# Patient Record
Sex: Male | Born: 1965 | Race: White | Hispanic: No | Marital: Single | Smoking: Former smoker
Health system: Southern US, Community
[De-identification: ages and names within clinical notes are randomized; demographics above are authoritative.]

## PROBLEM LIST (undated history)

## (undated) DIAGNOSIS — K560 Paralytic ileus: Secondary | ICD-10-CM

## (undated) DIAGNOSIS — F419 Anxiety disorder, unspecified: Secondary | ICD-10-CM

## (undated) DIAGNOSIS — D649 Anemia, unspecified: Secondary | ICD-10-CM

## (undated) DIAGNOSIS — D689 Coagulation defect, unspecified: Secondary | ICD-10-CM

## (undated) DIAGNOSIS — R000 Tachycardia, unspecified: Secondary | ICD-10-CM

## (undated) DIAGNOSIS — I10 Essential (primary) hypertension: Secondary | ICD-10-CM

## (undated) DIAGNOSIS — G9340 Encephalopathy, unspecified: Secondary | ICD-10-CM

## (undated) DIAGNOSIS — K7460 Unspecified cirrhosis of liver: Secondary | ICD-10-CM

## (undated) HISTORY — PX: ESOPHAGOGASTRODUODENOSCOPY: SHX1529

---

## 2010-05-06 HISTORY — PX: NASAL SEPTUM SURGERY: SHX37

## 2014-06-06 DIAGNOSIS — F419 Anxiety disorder, unspecified: Secondary | ICD-10-CM

## 2014-06-06 HISTORY — DX: Anxiety disorder, unspecified: F41.9

## 2015-06-07 DIAGNOSIS — K7460 Unspecified cirrhosis of liver: Secondary | ICD-10-CM

## 2015-06-07 HISTORY — DX: Unspecified cirrhosis of liver: K74.60

## 2016-04-06 DIAGNOSIS — D649 Anemia, unspecified: Secondary | ICD-10-CM

## 2016-04-06 DIAGNOSIS — D689 Coagulation defect, unspecified: Secondary | ICD-10-CM

## 2016-04-06 DIAGNOSIS — G9340 Encephalopathy, unspecified: Secondary | ICD-10-CM

## 2016-04-06 HISTORY — DX: Anemia, unspecified: D64.9

## 2016-04-06 HISTORY — DX: Encephalopathy, unspecified: G93.40

## 2016-04-06 HISTORY — DX: Coagulation defect, unspecified: D68.9

## 2016-07-20 HISTORY — PX: COLONOSCOPY W/ POLYPECTOMY: SHX1380

## 2016-09-04 DIAGNOSIS — R000 Tachycardia, unspecified: Secondary | ICD-10-CM

## 2016-09-04 HISTORY — DX: Tachycardia, unspecified: R00.0

## 2016-09-16 ENCOUNTER — Emergency Department (HOSPITAL_COMMUNITY)
Admission: EM | Admit: 2016-09-16 | Discharge: 2016-09-16 | Disposition: A | Discharge status: Home / Self Care | Payer: PRIVATE HEALTH INSURANCE | Attending: Emergency Medicine | Admitting: Emergency Medicine

## 2016-09-16 ENCOUNTER — Encounter (HOSPITAL_COMMUNITY): Payer: Self-pay | Admitting: Emergency Medicine

## 2016-09-16 DIAGNOSIS — I10 Essential (primary) hypertension: Secondary | ICD-10-CM | POA: Insufficient documentation

## 2016-09-16 DIAGNOSIS — F1010 Alcohol abuse, uncomplicated: Secondary | ICD-10-CM

## 2016-09-16 DIAGNOSIS — F10230 Alcohol dependence with withdrawal, uncomplicated: Secondary | ICD-10-CM | POA: Insufficient documentation

## 2016-09-16 HISTORY — DX: Essential (primary) hypertension: I10

## 2016-09-16 HISTORY — DX: Unspecified cirrhosis of liver: K74.60

## 2016-09-16 LAB — COMPREHENSIVE METABOLIC PANEL
ALT: 61 U/L (ref 17–63)
AST: 155 U/L — ABNORMAL HIGH (ref 15–41)
Albumin: 3.2 g/dL — ABNORMAL LOW (ref 3.5–5.0)
Alkaline Phosphatase: 96 U/L (ref 38–126)
Anion gap: 10 (ref 5–15)
BILIRUBIN TOTAL: 6.4 mg/dL — AB (ref 0.3–1.2)
BUN: 10 mg/dL (ref 6–20)
CALCIUM: 8.6 mg/dL — AB (ref 8.9–10.3)
CHLORIDE: 100 mmol/L — AB (ref 101–111)
CO2: 25 mmol/L (ref 22–32)
Creatinine, Ser: 0.55 mg/dL — ABNORMAL LOW (ref 0.61–1.24)
GFR calc Af Amer: >60 mL/min (ref >60)
Glucose, Bld: 144 mg/dL — ABNORMAL HIGH (ref 65–99)
Potassium: 3.5 mmol/L (ref 3.5–5.1)
Sodium: 135 mmol/L (ref 135–145)
Total Protein: 8.2 g/dL — ABNORMAL HIGH (ref 6.5–8.1)

## 2016-09-16 LAB — CBC
HEMATOCRIT: 32.2 % — AB (ref 39.0–52.0)
Hemoglobin: 11.5 g/dL — ABNORMAL LOW (ref 13.0–17.0)
MCH: 34.5 pg — ABNORMAL HIGH (ref 26.0–34.0)
MCHC: 35.7 g/dL (ref 30.0–36.0)
MCV: 96.7 fL (ref 78.0–100.0)
Platelets: 74 10*3/uL — ABNORMAL LOW (ref 150–400)
RBC: 3.33 MIL/uL — ABNORMAL LOW (ref 4.22–5.81)
RDW: 15.2 % (ref 11.5–15.5)
WBC: 7.2 10*3/uL (ref 4.0–10.5)

## 2016-09-16 LAB — ETHANOL: Alcohol, Ethyl (B): <5 mg/dL (ref <5)

## 2016-09-16 LAB — LIPASE, BLOOD: LIPASE: 48 U/L (ref 11–51)

## 2016-09-16 MED ORDER — SODIUM CHLORIDE 0.9 % IV SOLN
1000.0000 mL | INTRAVENOUS | Status: DC
  Administered 2016-09-16: 1000 mL via INTRAVENOUS

## 2016-09-16 MED ORDER — CHLORDIAZEPOXIDE HCL 25 MG PO CAPS: ORAL_CAPSULE | ORAL | 0 refills | Status: DC

## 2016-09-16 MED ORDER — SODIUM CHLORIDE 0.9 % IV SOLN
1000.0000 mL | Freq: Once | INTRAVENOUS | Status: AC
  Administered 2016-09-16: 1000 mL via INTRAVENOUS

## 2016-09-16 MED ORDER — LORAZEPAM 1 MG PO TABS
2.0000 mg | ORAL_TABLET | Freq: Once | ORAL | Status: AC
  Administered 2016-09-16: 2 mg via ORAL
  Filled 2016-09-16: qty 2

## 2016-09-16 MED ORDER — LORAZEPAM 2 MG/ML IJ SOLN
1.0000 mg | Freq: Once | INTRAMUSCULAR | Status: AC
  Administered 2016-09-16: 1 mg via INTRAVENOUS
  Filled 2016-09-16: qty 1

## 2016-09-16 MED ORDER — LORAZEPAM 1 MG PO TABS
1.0000 mg | ORAL_TABLET | Freq: Once | ORAL | Status: AC
  Administered 2016-09-16: 1 mg via ORAL
  Filled 2016-09-16: qty 1

## 2016-09-16 NOTE — Discharge Instructions (Signed)
Substance Abuse Treatment Programs ° °Intensive Outpatient Programs °[facility]     °[address]      °[place], [place]                   °[phone]      ° °[facility] °[address] °[place], [place] °336-379-7146 ° °[facility]     °(Inpatient and outpatient)     °[address]           °[phone]   ° °[facility] °336-288-1484 (Suboxone and Methadone) ° °[address]      °[place], [place] [address]      °336-882-2125      ° °[address] °[place], [place] °852-3033 ° °[facility] (Outpatient/Inpatient, Chemical)    °(insurance only) [phone]      °       °[facility] (Groups & Residential) °[place], [place] °336-389-1413 ° °   °[facility]     °[address]     °[place], [place]      °336-389-1413      ° °[facility] (for caregivers and family) °[address] °[place], [place] °336-299-4655 ° ° ° ° ° °Residential Treatment Programs °[facility]      °[address], [place], [place] [address]  °[phone]      ° °[facility] °[address], [place] [address] °[phone] ° °[facility]        °336-248-8914      ° °[facility] °1-800-659-3381 ° °[facility]             °[address]                                         °[place], [place]                                                °877-615-2722 or 336-784-9470                              ° °[facility] [place] °[address] °[place], [address] °1.877.941.8954 ° °[facility]    °[address]      °[place], [place]     °[phone]      ° °[facility] °[address] °[place], [place] °336-285-9073 ° °[facility]   °[address]     °[place], [place] [address]     °336-899-1550      °Admissions: 8am-3pm M-F ° °[facility] °[address] °[place],  [place] °336-227-7417 ° °[facility]: Residential Program (90 Days)   °[place], [place]      °336-725-8389 or 800-758-6077    ° °[facility] °[place], [place] °(Walk in Hours over the weekend or by referral) ° °[facility] °[address], [place],  [address] °([phone] ° °Crisis Mobile: [facility]:  1-877-626-1772 (for crisis response 24 hours a day) °[facility] Hotline:      1-800-256-2452 °Outpatient Psychiatry and Counseling ° °[facility]: Mobile Crisis   Management 24 hours:  1-877-626-1772 ° °[facility] sliding scale fee and walk in schedule: M-F 8am-12pm/1pm-3pm °[address]  °[place], [place] [address] °[phone] ° °[facility] °[address] °[place], [place] [address] °336-703-9650 ° °[facility] ([facility] known [facility])- new patient walk-in appointments available Monday - Friday 8am -3pm.          °[address] °[place], [place] [address] °336-676-6840 or crisis line- 336-676-6905 ° °[facility] Intensive Outpatient Therapy Program °[address] °[place], [place] [address] °336-832-9804 ° °[facility]                  °Crisis Services      °[phone]      °[address]     °[place], [place] [address]                ° °[facility]   °[facility] °[phone] °[address] °[place], [place] [address] ° ° °[facility]          °[address],  °[place], [place] [address]       °([phone]) [phone] ° °[facility] °[address] °[place], [place] [address] °336-292-1510 ° °[facility]    °[address]    °[place], [place] [address]     °336-632-3505      ° °Parish McKinney, MD     °[address]     °[place] [address]     °336-282-1251     °  °[facility] °[address] °[place] [address] ° °[facility]     °[address]     °[place], [place]      °336-542-2076      ° °[facility] °Shamsher Ahluwalia, MD °[address] °[place], [place] [address] °336-420-9558 ° °[facility]     °[address]     °[place], [place] [address]     °336-274-1237      ° °[facility] °[address] °[place], [place] [address] °336-854-4450 °Resources for Temporary Residential Assistance/Crisis Centers ° °DAY CENTERS °[facility] °M-F 8am-3pm   °[address] [place], [place] [address]   336-332-0824 °Services include: laundry, barbering, support groups, case management, phone  & computer access, showers, AA/NA mtgs, mental health/substance abuse nurse, job skills class, disability information, VA assistance, spiritual classes, etc.  ° °HOMELESS SHELTERS ° °[facility]     °[facility]   °[address], [place]     °[phone]       °       °[facility] (women and children)       °[address] °[place], [place] [address] °336-275-0820 °[email or web address] for application and process °Application Required ° °[facility]   °[address]    °[place] [address]     °[phone]       °             °[facility] °[address] °, [place] [address] °336.273.5572 °336-235-0363(schedule application appt.) °Application Required ° °[facility] (women only)    °[address]     °[place],  [address]     °336-884-1039      °  Intake starts 6pm daily °Need valid ID, SSC, & Police report °[facility] [place] °[address] °[place], [place] °336-881-5420 °Application Required ° °[facility] (men only)     °[address]      °[place], [place]     °[phone]      ° °[facility] °(Pregnant women only) °[address] °[place], [place] °336-275-0206 ° °[facility]      °[address]      °[place], [place] [address]     °336-722-9951      °       °[facility] °[address] °[place], [place] °336-723-1848 °90 day commitment/SA/Application process ° °[facility](men only)     °[address]     °[place], [place]     °[phone]       °Check-in at 7pm     °       °[facility] °[address] °[place], [place] [address] °336-248-6684 °Men/Women/Women and Children must be there by 7 pm ° °[facility] °[place], [place] °336-722-8721                ° °

## 2016-09-16 NOTE — ED Notes (Addendum)
[phone] [employer] Contact: Betsy  [facility] and [facility].  Betsy willing to give ride if needed tomorrow.  Please have patient call prior to d/c.

## 2016-09-16 NOTE — ED Provider Notes (Signed)
Emergency Department Provider Note   I have reviewed the triage vital signs and the nursing notes.   HISTORY  Chief Complaint Alcohol Problem   HPI Ray Hayden is a 51 y.o. male with PMH of cirrhosis and HTN resents to the emergency department for evaluation of alcohol detox. The patient is a daily drinker and was arrested for DUI yesterday. He got out of jail this morning and was having severe tremors. Denies any history of complicated withdrawal. He has an intake at treatment center tomorrow. No modifying factors. Symptoms are severe in quality.    Past Medical History:  Diagnosis Date  . Cirrhosis (HCC)   . Hypertension     There are no active problems to display for this patient.   History reviewed. No pertinent surgical history.    Allergies Sulfa antibiotics  No family history on file.  Social History Social History  Substance Use Topics  . Smoking status: Not on file  . Smokeless tobacco: Not on file  . Alcohol use Yes    Review of Systems  Constitutional: No fever/chills Eyes: No visual changes. ENT: No sore throat. Cardiovascular: Denies chest pain. Respiratory: Denies shortness of breath. Gastrointestinal: No abdominal pain.  No nausea, no vomiting.  No diarrhea.  No constipation. Genitourinary: Negative for dysuria. Musculoskeletal: Negative for back pain. Skin: Negative for rash. Neurological: Negative for headaches, focal weakness or numbness. Positive tremor.   10-point ROS otherwise negative.  ____________________________________________   PHYSICAL EXAM:  VITAL SIGNS: ED Triage Vitals [09/16/16 1415]  Enc Vitals Group     BP 124/70     Pulse Rate (!) 120     Resp 18     Temp 98.5 F (36.9 C)     Temp Source Oral     SpO2 100 %   Constitutional: Alert and oriented. Well appearing and in no acute distress. Tremor on exam.  Eyes: Conjunctivae are normal. PERRL.  Head: Atraumatic. Nose: No congestion/rhinnorhea. Mouth/Throat:  Mucous membranes are moist.  Neck: No stridor.   Cardiovascular: Normal rate, regular rhythm. Good peripheral circulation. Grossly normal heart sounds.   Respiratory: Normal respiratory effort.  No retractions. Lungs CTAB. Gastrointestinal: Soft and nontender. No distention.  Musculoskeletal: No lower extremity tenderness nor edema. No gross deformities of extremities. Neurologic:  Normal speech and language. No gross focal neurologic deficits are appreciated.  Skin:  Skin is warm, dry and intact. No rash noted. Psychiatric: Mood and affect are normal. Speech and behavior are normal.  ____________________________________________   LABS (all labs ordered are listed, but only abnormal results are displayed)  Labs Reviewed  CBC - Abnormal; Notable for the following:       Result Value   RBC 3.33 (*)    Hemoglobin 11.5 (*)    HCT 32.2 (*)    MCH 34.5 (*)    Platelets 74 (*)    All other components within normal limits  COMPREHENSIVE METABOLIC PANEL - Abnormal; Notable for the following:    Chloride 100 (*)    Glucose, Bld 144 (*)    Creatinine, Ser 0.55 (*)    Calcium 8.6 (*)    Total Protein 8.2 (*)    Albumin 3.2 (*)    AST 155 (*)    Total Bilirubin 6.4 (*)    All other components within normal limits  LIPASE, BLOOD  ETHANOL   ____________________________________________   PROCEDURES  Procedure(s) performed:   Procedures  None ____________________________________________   INITIAL IMPRESSION / ASSESSMENT AND PLAN /  ED COURSE  Pertinent labs & imaging results that were available during my care of the patient were reviewed by me and considered in my medical decision making (see chart for details).  Patient presents to the ED with for evaluation of EtOH detox. No history of seizure or DTs. Will observe for serial CIWA and reassessment.   07:48 PM Patient is resting comfortably. No distress. Does not meet criteria for inpatient detox. Discharging home with Librium  taper and [facility] with [facility]. [facility] member will come to drive the patient home and help him get to treatment center tomorrow.   At this time, I do not feel there is any life-threatening condition present. I have reviewed and discussed all results (EKG, imaging, lab, urine as appropriate), exam findings with patient. I have reviewed nursing notes and appropriate previous records.  I feel the patient is safe to be discharged home without further emergent workup. Discussed usual and customary return precautions. Patient and family (if present) verbalize understanding and are comfortable with this plan.  Patient will follow-up with their primary care provider. If they do not have a primary care provider, information for follow-up has been provided to them. All questions have been answered.  ____________________________________________  FINAL CLINICAL IMPRESSION(S) / ED DIAGNOSES  Final diagnoses:  Alcohol abuse     MEDICATIONS GIVEN DURING THIS VISIT:  Medications  0.9 %  sodium chloride infusion (not administered)    Followed by  0.9 %  sodium chloride infusion (not administered)  LORazepam (ATIVAN) injection 1 mg (not administered)  LORazepam (ATIVAN) tablet 1 mg (not administered)     NEW OUTPATIENT MEDICATIONS STARTED DURING THIS VISIT:  Discharge Medication List as of 09/16/2016  7:47 PM    START taking these medications   Details  chlordiazePOXIDE (LIBRIUM) 25 MG capsule  PO TID x 1D, then 25-50mg  PO BID X 1D, then 25-50mg  PO QD X 1D, Print          Note:  This document was prepared using Dragon voice recognition software and may include unintentional dictation errors.  Alona Bene, MD Emergency Medicine   Maia Plan, MD 09/19/16 [phone]

## 2016-09-16 NOTE — ED Triage Notes (Signed)
Per EMS pt complaint of tremor related to ETOH use; last use 0400 yesterday.

## 2016-09-21 ENCOUNTER — Inpatient Hospital Stay (HOSPITAL_COMMUNITY): Payer: PRIVATE HEALTH INSURANCE

## 2016-09-21 ENCOUNTER — Inpatient Hospital Stay (HOSPITAL_COMMUNITY): Payer: PRIVATE HEALTH INSURANCE | Admitting: Anesthesiology

## 2016-09-21 ENCOUNTER — Emergency Department (HOSPITAL_COMMUNITY): Payer: PRIVATE HEALTH INSURANCE

## 2016-09-21 ENCOUNTER — Encounter (HOSPITAL_COMMUNITY)
Admission: EM | Disposition: A | Discharge status: Skilled Nursing Facility | Payer: Self-pay | Source: Home / Self Care | Attending: Pulmonary Disease

## 2016-09-21 ENCOUNTER — Encounter (HOSPITAL_COMMUNITY): Payer: Self-pay | Admitting: Emergency Medicine

## 2016-09-21 ENCOUNTER — Inpatient Hospital Stay (HOSPITAL_COMMUNITY)
Admission: EM | Admit: 2016-09-21 | Discharge: 2016-10-13 | DRG: 432 | Disposition: A | Discharge status: Skilled Nursing Facility | Payer: PRIVATE HEALTH INSURANCE | Attending: Nephrology | Admitting: Nephrology

## 2016-09-21 DIAGNOSIS — Z79899 Other long term (current) drug therapy: Secondary | ICD-10-CM

## 2016-09-21 DIAGNOSIS — R140 Abdominal distension (gaseous): Secondary | ICD-10-CM

## 2016-09-21 DIAGNOSIS — R627 Adult failure to thrive: Secondary | ICD-10-CM | POA: Diagnosis not present

## 2016-09-21 DIAGNOSIS — Z7189 Other specified counseling: Secondary | ICD-10-CM

## 2016-09-21 DIAGNOSIS — K920 Hematemesis: Secondary | ICD-10-CM | POA: Diagnosis present

## 2016-09-21 DIAGNOSIS — K56600 Partial intestinal obstruction, unspecified as to cause: Secondary | ICD-10-CM | POA: Diagnosis not present

## 2016-09-21 DIAGNOSIS — K3189 Other diseases of stomach and duodenum: Secondary | ICD-10-CM | POA: Diagnosis not present

## 2016-09-21 DIAGNOSIS — R578 Other shock: Secondary | ICD-10-CM

## 2016-09-21 DIAGNOSIS — K922 Gastrointestinal hemorrhage, unspecified: Secondary | ICD-10-CM

## 2016-09-21 DIAGNOSIS — Z9114 Patient's other noncompliance with medication regimen: Secondary | ICD-10-CM

## 2016-09-21 DIAGNOSIS — I8511 Secondary esophageal varices with bleeding: Secondary | ICD-10-CM

## 2016-09-21 DIAGNOSIS — R945 Abnormal results of liver function studies: Secondary | ICD-10-CM

## 2016-09-21 DIAGNOSIS — K766 Portal hypertension: Secondary | ICD-10-CM | POA: Diagnosis present

## 2016-09-21 DIAGNOSIS — K56609 Unspecified intestinal obstruction, unspecified as to partial versus complete obstruction: Secondary | ICD-10-CM

## 2016-09-21 DIAGNOSIS — D696 Thrombocytopenia, unspecified: Secondary | ICD-10-CM | POA: Diagnosis present

## 2016-09-21 DIAGNOSIS — E871 Hypo-osmolality and hyponatremia: Secondary | ICD-10-CM | POA: Diagnosis present

## 2016-09-21 DIAGNOSIS — D72829 Elevated white blood cell count, unspecified: Secondary | ICD-10-CM | POA: Diagnosis not present

## 2016-09-21 DIAGNOSIS — R7989 Other specified abnormal findings of blood chemistry: Secondary | ICD-10-CM

## 2016-09-21 DIAGNOSIS — F10239 Alcohol dependence with withdrawal, unspecified: Secondary | ICD-10-CM | POA: Diagnosis not present

## 2016-09-21 DIAGNOSIS — I8501 Esophageal varices with bleeding: Secondary | ICD-10-CM | POA: Diagnosis not present

## 2016-09-21 DIAGNOSIS — M79605 Pain in left leg: Secondary | ICD-10-CM | POA: Diagnosis not present

## 2016-09-21 DIAGNOSIS — J690 Pneumonitis due to inhalation of food and vomit: Secondary | ICD-10-CM | POA: Diagnosis not present

## 2016-09-21 DIAGNOSIS — Z8719 Personal history of other diseases of the digestive system: Secondary | ICD-10-CM

## 2016-09-21 DIAGNOSIS — K7011 Alcoholic hepatitis with ascites: Secondary | ICD-10-CM | POA: Diagnosis not present

## 2016-09-21 DIAGNOSIS — R5381 Other malaise: Secondary | ICD-10-CM | POA: Diagnosis not present

## 2016-09-21 DIAGNOSIS — M79604 Pain in right leg: Secondary | ICD-10-CM | POA: Diagnosis not present

## 2016-09-21 DIAGNOSIS — E875 Hyperkalemia: Secondary | ICD-10-CM | POA: Diagnosis not present

## 2016-09-21 DIAGNOSIS — A419 Sepsis, unspecified organism: Secondary | ICD-10-CM | POA: Diagnosis not present

## 2016-09-21 DIAGNOSIS — D689 Coagulation defect, unspecified: Secondary | ICD-10-CM | POA: Diagnosis not present

## 2016-09-21 DIAGNOSIS — N4889 Other specified disorders of penis: Secondary | ICD-10-CM | POA: Diagnosis not present

## 2016-09-21 DIAGNOSIS — K652 Spontaneous bacterial peritonitis: Secondary | ICD-10-CM | POA: Diagnosis not present

## 2016-09-21 DIAGNOSIS — K567 Ileus, unspecified: Secondary | ICD-10-CM

## 2016-09-21 DIAGNOSIS — K7200 Acute and subacute hepatic failure without coma: Secondary | ICD-10-CM | POA: Diagnosis present

## 2016-09-21 DIAGNOSIS — K7290 Hepatic failure, unspecified without coma: Secondary | ICD-10-CM | POA: Diagnosis not present

## 2016-09-21 DIAGNOSIS — R000 Tachycardia, unspecified: Secondary | ICD-10-CM | POA: Diagnosis not present

## 2016-09-21 DIAGNOSIS — K5900 Constipation, unspecified: Secondary | ICD-10-CM

## 2016-09-21 DIAGNOSIS — Z452 Encounter for adjustment and management of vascular access device: Secondary | ICD-10-CM

## 2016-09-21 DIAGNOSIS — I10 Essential (primary) hypertension: Secondary | ICD-10-CM | POA: Diagnosis present

## 2016-09-21 DIAGNOSIS — G9341 Metabolic encephalopathy: Secondary | ICD-10-CM | POA: Diagnosis present

## 2016-09-21 DIAGNOSIS — K769 Liver disease, unspecified: Secondary | ICD-10-CM | POA: Diagnosis not present

## 2016-09-21 DIAGNOSIS — K621 Rectal polyp: Secondary | ICD-10-CM | POA: Diagnosis not present

## 2016-09-21 DIAGNOSIS — J189 Pneumonia, unspecified organism: Secondary | ICD-10-CM

## 2016-09-21 DIAGNOSIS — G9340 Encephalopathy, unspecified: Secondary | ICD-10-CM | POA: Diagnosis not present

## 2016-09-21 DIAGNOSIS — R739 Hyperglycemia, unspecified: Secondary | ICD-10-CM | POA: Diagnosis present

## 2016-09-21 DIAGNOSIS — R509 Fever, unspecified: Secondary | ICD-10-CM

## 2016-09-21 DIAGNOSIS — K7460 Unspecified cirrhosis of liver: Secondary | ICD-10-CM | POA: Diagnosis not present

## 2016-09-21 DIAGNOSIS — R6521 Severe sepsis with septic shock: Secondary | ICD-10-CM | POA: Diagnosis not present

## 2016-09-21 DIAGNOSIS — E46 Unspecified protein-calorie malnutrition: Secondary | ICD-10-CM | POA: Diagnosis present

## 2016-09-21 DIAGNOSIS — K648 Other hemorrhoids: Secondary | ICD-10-CM | POA: Diagnosis not present

## 2016-09-21 DIAGNOSIS — R601 Generalized edema: Secondary | ICD-10-CM | POA: Diagnosis not present

## 2016-09-21 DIAGNOSIS — D684 Acquired coagulation factor deficiency: Secondary | ICD-10-CM | POA: Diagnosis present

## 2016-09-21 DIAGNOSIS — Z6825 Body mass index (BMI) 25.0-25.9, adult: Secondary | ICD-10-CM

## 2016-09-21 DIAGNOSIS — D62 Acute posthemorrhagic anemia: Secondary | ICD-10-CM

## 2016-09-21 DIAGNOSIS — J9601 Acute respiratory failure with hypoxia: Secondary | ICD-10-CM | POA: Diagnosis not present

## 2016-09-21 DIAGNOSIS — K7040 Alcoholic hepatic failure without coma: Secondary | ICD-10-CM | POA: Diagnosis present

## 2016-09-21 DIAGNOSIS — R571 Hypovolemic shock: Secondary | ICD-10-CM | POA: Diagnosis not present

## 2016-09-21 DIAGNOSIS — K625 Hemorrhage of anus and rectum: Secondary | ICD-10-CM | POA: Diagnosis not present

## 2016-09-21 DIAGNOSIS — R188 Other ascites: Secondary | ICD-10-CM

## 2016-09-21 DIAGNOSIS — E872 Acidosis, unspecified: Secondary | ICD-10-CM | POA: Insufficient documentation

## 2016-09-21 DIAGNOSIS — K7682 Hepatic encephalopathy: Secondary | ICD-10-CM

## 2016-09-21 DIAGNOSIS — F17210 Nicotine dependence, cigarettes, uncomplicated: Secondary | ICD-10-CM | POA: Diagnosis present

## 2016-09-21 DIAGNOSIS — J9690 Respiratory failure, unspecified, unspecified whether with hypoxia or hypercapnia: Secondary | ICD-10-CM

## 2016-09-21 DIAGNOSIS — J988 Other specified respiratory disorders: Secondary | ICD-10-CM

## 2016-09-21 DIAGNOSIS — Z9189 Other specified personal risk factors, not elsewhere classified: Secondary | ICD-10-CM | POA: Diagnosis not present

## 2016-09-21 DIAGNOSIS — E876 Hypokalemia: Secondary | ICD-10-CM | POA: Diagnosis not present

## 2016-09-21 DIAGNOSIS — I998 Other disorder of circulatory system: Secondary | ICD-10-CM

## 2016-09-21 DIAGNOSIS — K7031 Alcoholic cirrhosis of liver with ascites: Secondary | ICD-10-CM | POA: Diagnosis present

## 2016-09-21 DIAGNOSIS — D638 Anemia in other chronic diseases classified elsewhere: Secondary | ICD-10-CM | POA: Diagnosis present

## 2016-09-21 DIAGNOSIS — Z515 Encounter for palliative care: Secondary | ICD-10-CM

## 2016-09-21 DIAGNOSIS — Z9911 Dependence on respirator [ventilator] status: Secondary | ICD-10-CM

## 2016-09-21 DIAGNOSIS — E8770 Fluid overload, unspecified: Secondary | ICD-10-CM | POA: Diagnosis not present

## 2016-09-21 DIAGNOSIS — B1920 Unspecified viral hepatitis C without hepatic coma: Secondary | ICD-10-CM | POA: Diagnosis present

## 2016-09-21 DIAGNOSIS — Z9289 Personal history of other medical treatment: Secondary | ICD-10-CM

## 2016-09-21 DIAGNOSIS — K7030 Alcoholic cirrhosis of liver without ascites: Secondary | ICD-10-CM | POA: Diagnosis not present

## 2016-09-21 HISTORY — DX: Coagulation defect, unspecified: D68.9

## 2016-09-21 HISTORY — DX: Tachycardia, unspecified: R00.0

## 2016-09-21 HISTORY — DX: Anxiety disorder, unspecified: F41.9

## 2016-09-21 HISTORY — PX: ESOPHAGOGASTRODUODENOSCOPY: SHX5428

## 2016-09-21 HISTORY — DX: Paralytic ileus: K56.0

## 2016-09-21 HISTORY — DX: Encephalopathy, unspecified: G93.40

## 2016-09-21 HISTORY — DX: Anemia, unspecified: D64.9

## 2016-09-21 LAB — COMPREHENSIVE METABOLIC PANEL
ALT: 30 U/L (ref 17–63)
AST: 62 U/L — ABNORMAL HIGH (ref 15–41)
Albumin: 1.9 g/dL — ABNORMAL LOW (ref 3.5–5.0)
Alkaline Phosphatase: 64 U/L (ref 38–126)
Anion gap: 10 (ref 5–15)
BUN: 21 mg/dL — AB (ref 6–20)
CHLORIDE: 99 mmol/L — AB (ref 101–111)
CO2: 21 mmol/L — AB (ref 22–32)
CREATININE: 1 mg/dL (ref 0.61–1.24)
Calcium: 7.3 mg/dL — ABNORMAL LOW (ref 8.9–10.3)
GFR calc Af Amer: >60 mL/min (ref >60)
GFR calc non Af Amer: >60 mL/min (ref >60)
Glucose, Bld: 133 mg/dL — ABNORMAL HIGH (ref 65–99)
Potassium: 4.7 mmol/L (ref 3.5–5.1)
SODIUM: 130 mmol/L — AB (ref 135–145)
Total Bilirubin: 3.4 mg/dL — ABNORMAL HIGH (ref 0.3–1.2)
Total Protein: 5.2 g/dL — ABNORMAL LOW (ref 6.5–8.1)

## 2016-09-21 LAB — CBC WITH DIFFERENTIAL/PLATELET
BASOS ABS: 0 10*3/uL (ref 0.0–0.1)
BASOS PCT: 0 %
EOS PCT: 2 %
Eosinophils Absolute: 0.2 10*3/uL (ref 0.0–0.7)
HCT: 17.3 % — ABNORMAL LOW (ref 39.0–52.0)
HEMOGLOBIN: 6 g/dL — AB (ref 13.0–17.0)
LYMPHS PCT: 29 %
Lymphs Abs: 3.5 10*3/uL (ref 0.7–4.0)
MCH: 34.3 pg — AB (ref 26.0–34.0)
MCHC: 34.7 g/dL (ref 30.0–36.0)
MCV: 98.9 fL (ref 78.0–100.0)
Monocytes Absolute: 2.4 10*3/uL — ABNORMAL HIGH (ref 0.1–1.0)
Monocytes Relative: 20 %
NEUTROS ABS: 5.9 10*3/uL (ref 1.7–7.7)
Neutrophils Relative %: 49 %
Platelets: 115 10*3/uL — ABNORMAL LOW (ref 150–400)
RBC: 1.75 MIL/uL — ABNORMAL LOW (ref 4.22–5.81)
RDW: 16.2 % — ABNORMAL HIGH (ref 11.5–15.5)
WBC: 12 10*3/uL — ABNORMAL HIGH (ref 4.0–10.5)

## 2016-09-21 LAB — POCT I-STAT 3, ART BLOOD GAS (G3+)
ACID-BASE DEFICIT: 12 mmol/L — AB (ref 0.0–2.0)
BICARBONATE: 14.8 mmol/L — AB (ref 20.0–28.0)
O2 Saturation: 97 %
PH ART: 7.2 — AB (ref 7.350–7.450)
TCO2: 16 mmol/L (ref 0–100)
pCO2 arterial: 37.6 mmHg (ref 32.0–48.0)
pO2, Arterial: 103 mmHg (ref 83.0–108.0)

## 2016-09-21 LAB — I-STAT ARTERIAL BLOOD GAS, ED
Acid-base deficit: 8 mmol/L — ABNORMAL HIGH (ref 0.0–2.0)
BICARBONATE: 18.3 mmol/L — AB (ref 20.0–28.0)
O2 SAT: 96 %
PH ART: 7.235 — AB (ref 7.350–7.450)
PO2 ART: 95 mmHg (ref 83.0–108.0)
Patient temperature: 98.9
TCO2: 20 mmol/L (ref 0–100)
pCO2 arterial: 43.3 mmHg (ref 32.0–48.0)

## 2016-09-21 LAB — URINALYSIS, ROUTINE W REFLEX MICROSCOPIC
Bilirubin Urine: NEGATIVE
Glucose, UA: NEGATIVE mg/dL
Hgb urine dipstick: NEGATIVE
KETONES UR: NEGATIVE mg/dL
LEUKOCYTES UA: NEGATIVE
NITRITE: NEGATIVE
PH: 5 (ref 5.0–8.0)
Protein, ur: NEGATIVE mg/dL
Specific Gravity, Urine: 1.021 (ref 1.005–1.030)

## 2016-09-21 LAB — I-STAT VENOUS BLOOD GAS, ED
ACID-BASE DEFICIT: 1 mmol/L (ref 0.0–2.0)
BICARBONATE: 21.9 mmol/L (ref 20.0–28.0)
O2 Saturation: 71 %
PO2 VEN: 34 mmHg (ref 32.0–45.0)
TCO2: 23 mmol/L (ref 0–100)
pCO2, Ven: 29.9 mmHg — ABNORMAL LOW (ref 44.0–60.0)
pH, Ven: 7.473 — ABNORMAL HIGH (ref 7.250–7.430)

## 2016-09-21 LAB — I-STAT CHEM 8, ED
BUN: 24 mg/dL — ABNORMAL HIGH (ref 6–20)
CREATININE: 1 mg/dL (ref 0.61–1.24)
Calcium, Ion: 0.96 mmol/L — ABNORMAL LOW (ref 1.15–1.40)
Chloride: 99 mmol/L — ABNORMAL LOW (ref 101–111)
Glucose, Bld: 126 mg/dL — ABNORMAL HIGH (ref 65–99)
HEMATOCRIT: 22 % — AB (ref 39.0–52.0)
HEMOGLOBIN: 7.5 g/dL — AB (ref 13.0–17.0)
Potassium: 4.7 mmol/L (ref 3.5–5.1)
SODIUM: 133 mmol/L — AB (ref 135–145)
TCO2: 22 mmol/L (ref 0–100)

## 2016-09-21 LAB — APTT: APTT: 40 s — AB (ref 24–36)

## 2016-09-21 LAB — I-STAT CG4 LACTIC ACID, ED: Lactic Acid, Venous: 4.35 mmol/L (ref 0.5–1.9)

## 2016-09-21 LAB — PREPARE FRESH FROZEN PLASMA
Unit division: 0
Unit division: 0

## 2016-09-21 LAB — PROTIME-INR
INR: 2.28
INR: 2.16
PROTHROMBIN TIME: 25.5 s — AB (ref 11.4–15.2)
PROTHROMBIN TIME: 24.5 s — AB (ref 11.4–15.2)

## 2016-09-21 LAB — BPAM FFP
BLOOD PRODUCT EXPIRATION DATE: 201805012359
Blood Product Expiration Date: 201804202359
ISSUE DATE / TIME: 201804180757
ISSUE DATE / TIME: 201804180757
UNIT TYPE AND RH: 6200
Unit Type and Rh: 6200

## 2016-09-21 LAB — LACTIC ACID, PLASMA
LACTIC ACID, VENOUS: 7.6 mmol/L — AB (ref 0.5–1.9)
Lactic Acid, Venous: 5.3 mmol/L (ref 0.5–1.9)

## 2016-09-21 LAB — RAPID URINE DRUG SCREEN, HOSP PERFORMED
Amphetamines: NOT DETECTED
BARBITURATES: NOT DETECTED
BENZODIAZEPINES: POSITIVE — AB
COCAINE: NOT DETECTED
Opiates: NOT DETECTED
Tetrahydrocannabinol: NOT DETECTED

## 2016-09-21 LAB — TRIGLYCERIDES: TRIGLYCERIDES: 64 mg/dL (ref <150)

## 2016-09-21 LAB — ETHANOL: Alcohol, Ethyl (B): <5 mg/dL (ref <5)

## 2016-09-21 LAB — HEMOGLOBIN AND HEMATOCRIT, BLOOD
HCT: 20.7 % — ABNORMAL LOW (ref 39.0–52.0)
HCT: 27.5 % — ABNORMAL LOW (ref 39.0–52.0)
HEMATOCRIT: 24.3 % — AB (ref 39.0–52.0)
HEMOGLOBIN: 9.1 g/dL — AB (ref 13.0–17.0)
Hemoglobin: 7.2 g/dL — ABNORMAL LOW (ref 13.0–17.0)
Hemoglobin: 8.5 g/dL — ABNORMAL LOW (ref 13.0–17.0)

## 2016-09-21 LAB — LIPASE, BLOOD: Lipase: 65 U/L — ABNORMAL HIGH (ref 11–51)

## 2016-09-21 LAB — AMMONIA: Ammonia: 78 umol/L — ABNORMAL HIGH (ref 9–35)

## 2016-09-21 LAB — ABO/RH: ABO/RH(D): AB POS

## 2016-09-21 LAB — PREPARE RBC (CROSSMATCH)

## 2016-09-21 LAB — MRSA PCR SCREENING: MRSA BY PCR: NEGATIVE

## 2016-09-21 SURGERY — EGD (ESOPHAGOGASTRODUODENOSCOPY)
Anesthesia: General

## 2016-09-21 MED ORDER — SODIUM CHLORIDE 0.9 % IV SOLN
Freq: Once | INTRAVENOUS | Status: AC
  Administered 2016-09-21: 500 mL/h via INTRAVENOUS

## 2016-09-21 MED ORDER — SODIUM CHLORIDE 0.9 % IV SOLN
0.0000 ug/min | INTRAVENOUS | Status: DC
  Administered 2016-09-21: 150 ug/min via INTRAVENOUS
  Administered 2016-09-21 (×2): 200 ug/min via INTRAVENOUS
  Administered 2016-09-21: 20 ug/min via INTRAVENOUS
  Filled 2016-09-21 (×6): qty 1

## 2016-09-21 MED ORDER — CHLORHEXIDINE GLUCONATE CLOTH 2 % EX PADS
6.0000 | MEDICATED_PAD | Freq: Every day | CUTANEOUS | Status: DC
  Administered 2016-09-21 – 2016-09-27 (×7): 6 via TOPICAL

## 2016-09-21 MED ORDER — FENTANYL 2500MCG IN NS 250ML (10MCG/ML) PREMIX INFUSION
20.0000 ug/h | INTRAVENOUS | Status: DC
  Administered 2016-09-21: 20 ug/h via INTRAVENOUS
  Filled 2016-09-21: qty 250

## 2016-09-21 MED ORDER — PANTOPRAZOLE SODIUM 40 MG IV SOLR
40.0000 mg | Freq: Two times a day (BID) | INTRAVENOUS | Status: DC
  Administered 2016-09-24 – 2016-09-30 (×12): 40 mg via INTRAVENOUS
  Filled 2016-09-21 (×12): qty 40

## 2016-09-21 MED ORDER — SODIUM CHLORIDE 0.9% FLUSH
10.0000 mL | Freq: Two times a day (BID) | INTRAVENOUS | Status: DC
  Administered 2016-09-21 – 2016-09-22 (×3): 10 mL
  Administered 2016-09-23: 20 mL
  Administered 2016-09-23 – 2016-09-24 (×2): 10 mL
  Administered 2016-09-25 – 2016-09-26 (×3): 20 mL
  Administered 2016-09-27 – 2016-09-28 (×2): 10 mL

## 2016-09-21 MED ORDER — SODIUM CHLORIDE 0.9 % IV SOLN
8.0000 mg/h | INTRAVENOUS | Status: DC
  Administered 2016-09-21 – 2016-09-23 (×7): 8 mg/h via INTRAVENOUS
  Filled 2016-09-21 (×12): qty 80

## 2016-09-21 MED ORDER — PROPOFOL 1000 MG/100ML IV EMUL
0.0000 ug/kg/min | INTRAVENOUS | Status: DC
  Administered 2016-09-21 (×2): 20 ug/kg/min via INTRAVENOUS
  Administered 2016-09-22: 10 ug/kg/min via INTRAVENOUS
  Filled 2016-09-21 (×4): qty 100

## 2016-09-21 MED ORDER — IOPAMIDOL (ISOVUE-300) INJECTION 61%
INTRAVENOUS | Status: AC
  Administered 2016-09-21: 100 mL
  Filled 2016-09-21: qty 100

## 2016-09-21 MED ORDER — FENTANYL BOLUS VIA INFUSION
50.0000 ug | INTRAVENOUS | Status: DC | PRN
  Administered 2016-09-24 – 2016-09-26 (×5): 50 ug via INTRAVENOUS
  Filled 2016-09-21: qty 50

## 2016-09-21 MED ORDER — ETOMIDATE 2 MG/ML IV SOLN
0.3000 mg/kg | Freq: Once | INTRAVENOUS | Status: AC
  Administered 2016-09-21: 20 mg via INTRAVENOUS

## 2016-09-21 MED ORDER — LORAZEPAM 2 MG/ML IJ SOLN: 2.0000 mg | INTRAMUSCULAR | Status: DC | PRN

## 2016-09-21 MED ORDER — SODIUM CHLORIDE 0.9 % IV SOLN
80.0000 mg | Freq: Once | INTRAVENOUS | Status: AC
  Administered 2016-09-21: 80 mg via INTRAVENOUS
  Filled 2016-09-21: qty 80

## 2016-09-21 MED ORDER — VECURONIUM BROMIDE 10 MG IV SOLR
10.0000 mg | Freq: Once | INTRAVENOUS | Status: DC
  Filled 2016-09-21: qty 10

## 2016-09-21 MED ORDER — CHLORHEXIDINE GLUCONATE 0.12% ORAL RINSE (MEDLINE KIT)
15.0000 mL | Freq: Two times a day (BID) | OROMUCOSAL | Status: DC
  Administered 2016-09-21: 15 mL via OROMUCOSAL

## 2016-09-21 MED ORDER — FENTANYL 2500MCG IN NS 250ML (10MCG/ML) PREMIX INFUSION
25.0000 ug/h | INTRAVENOUS | Status: DC
  Administered 2016-09-21 (×3): 200 ug/h via INTRAVENOUS
  Administered 2016-09-23 (×2): 100 ug/h via INTRAVENOUS
  Administered 2016-09-25 – 2016-09-26 (×3): 125 ug/h via INTRAVENOUS
  Administered 2016-09-26: 100 ug/h via INTRAVENOUS
  Filled 2016-09-21 (×7): qty 250

## 2016-09-21 MED ORDER — NOREPINEPHRINE BITARTRATE 1 MG/ML IV SOLN
0.0000 ug/min | INTRAVENOUS | Status: DC
  Filled 2016-09-21: qty 4

## 2016-09-21 MED ORDER — SODIUM CHLORIDE 0.9 % IV SOLN: Freq: Once | INTRAVENOUS | Status: DC

## 2016-09-21 MED ORDER — SODIUM CHLORIDE 0.9% FLUSH
10.0000 mL | INTRAVENOUS | Status: DC | PRN
  Administered 2016-09-25: 10 mL
  Filled 2016-09-21: qty 40

## 2016-09-21 MED ORDER — MIDAZOLAM HCL 2 MG/2ML IJ SOLN
2.0000 mg | INTRAMUSCULAR | Status: DC | PRN
  Administered 2016-09-24 – 2016-09-26 (×11): 2 mg via INTRAVENOUS
  Filled 2016-09-21 (×11): qty 2

## 2016-09-21 MED ORDER — THIAMINE HCL 100 MG/ML IJ SOLN
100.0000 mg | Freq: Every day | INTRAMUSCULAR | Status: DC
  Administered 2016-09-22 – 2016-09-27 (×7): 100 mg via INTRAVENOUS
  Filled 2016-09-21 (×6): qty 2

## 2016-09-21 MED ORDER — FENTANYL CITRATE (PF) 100 MCG/2ML IJ SOLN
INTRAMUSCULAR | Status: AC
  Filled 2016-09-21: qty 2

## 2016-09-21 MED ORDER — SUCCINYLCHOLINE CHLORIDE 20 MG/ML IJ SOLN
120.0000 mg | Freq: Once | INTRAMUSCULAR | Status: AC
  Administered 2016-09-21: 120 mg via INTRAVENOUS

## 2016-09-21 MED ORDER — ONDANSETRON HCL 4 MG/2ML IJ SOLN
INTRAMUSCULAR | Status: AC
  Administered 2016-09-21: 4 mg
  Filled 2016-09-21: qty 2

## 2016-09-21 MED ORDER — OCTREOTIDE LOAD VIA INFUSION
50.0000 ug | Freq: Once | INTRAVENOUS | Status: AC
  Administered 2016-09-21: 50 ug via INTRAVENOUS
  Filled 2016-09-21: qty 25

## 2016-09-21 MED ORDER — SODIUM CHLORIDE 0.9 % IV BOLUS (SEPSIS)
500.0000 mL | Freq: Once | INTRAVENOUS | Status: AC
  Administered 2016-09-21: 500 mL via INTRAVENOUS

## 2016-09-21 MED ORDER — VITAMIN K1 10 MG/ML IJ SOLN
5.0000 mg | INTRAVENOUS | Status: DC
  Filled 2016-09-21: qty 0.5

## 2016-09-21 MED ORDER — STERILE WATER FOR INJECTION IV SOLN
INTRAVENOUS | Status: DC
  Administered 2016-09-21 – 2016-09-22 (×2): via INTRAVENOUS
  Filled 2016-09-21 (×4): qty 850

## 2016-09-21 MED ORDER — ORAL CARE MOUTH RINSE
15.0000 mL | Freq: Four times a day (QID) | OROMUCOSAL | Status: DC
  Administered 2016-09-21: 15 mL via OROMUCOSAL

## 2016-09-21 MED ORDER — NOREPINEPHRINE BITARTRATE 1 MG/ML IV SOLN
0.0000 ug/min | INTRAVENOUS | Status: DC
  Administered 2016-09-21: 15 ug/min via INTRAVENOUS
  Administered 2016-09-22: 30 ug/min via INTRAVENOUS
  Administered 2016-09-22: 25 ug/min via INTRAVENOUS
  Administered 2016-09-23: 10 ug/min via INTRAVENOUS
  Administered 2016-09-23: 20 ug/min via INTRAVENOUS
  Administered 2016-09-25: 1.5 ug/min via INTRAVENOUS
  Filled 2016-09-21 (×8): qty 16

## 2016-09-21 MED ORDER — LORAZEPAM 2 MG/ML IJ SOLN
2.0000 mg | Freq: Once | INTRAMUSCULAR | Status: AC
  Administered 2016-09-21: 2 mg via INTRAVENOUS
  Filled 2016-09-21: qty 1

## 2016-09-21 MED ORDER — MIDAZOLAM HCL 2 MG/2ML IJ SOLN
INTRAMUSCULAR | Status: AC
  Filled 2016-09-21: qty 2

## 2016-09-21 MED ORDER — SODIUM CHLORIDE 0.9 % IV SOLN
50.0000 ug/h | INTRAVENOUS | Status: DC
  Administered 2016-09-21 – 2016-09-23 (×6): 50 ug/h via INTRAVENOUS
  Filled 2016-09-21 (×11): qty 1

## 2016-09-21 MED ORDER — SODIUM CHLORIDE 0.9 % IV SOLN
INTRAVENOUS | Status: DC
  Administered 2016-09-21: 13:00:00 via INTRAVENOUS
  Administered 2016-09-25 – 2016-09-26 (×2): 30 mL/h via INTRAVENOUS
  Administered 2016-09-29 – 2016-10-02 (×2): via INTRAVENOUS

## 2016-09-21 MED ORDER — MIDAZOLAM HCL 2 MG/2ML IJ SOLN
INTRAMUSCULAR | Status: AC
  Administered 2016-09-21: 8 mg
  Filled 2016-09-21: qty 8

## 2016-09-21 MED ORDER — CHLORHEXIDINE GLUCONATE 0.12% ORAL RINSE (MEDLINE KIT)
15.0000 mL | Freq: Two times a day (BID) | OROMUCOSAL | Status: DC
  Administered 2016-09-21 – 2016-09-27 (×13): 15 mL via OROMUCOSAL

## 2016-09-21 MED ORDER — VECURONIUM BROMIDE 10 MG IV SOLR
INTRAVENOUS | Status: AC
  Administered 2016-09-21: 10 mg
  Filled 2016-09-21: qty 10

## 2016-09-21 MED ORDER — DEXTROSE 5 % IV SOLN
2.0000 g | INTRAVENOUS | Status: DC
  Administered 2016-09-21 – 2016-09-22 (×2): 2 g via INTRAVENOUS
  Filled 2016-09-21 (×2): qty 2

## 2016-09-21 MED ORDER — FOLIC ACID 5 MG/ML IJ SOLN
1.0000 mg | Freq: Every day | INTRAMUSCULAR | Status: DC
Start: 2016-09-21 — End: 2016-09-27
  Administered 2016-09-21 – 2016-09-26 (×6): 1 mg via INTRAVENOUS
  Filled 2016-09-21 (×7): qty 0.2

## 2016-09-21 MED ORDER — ORAL CARE MOUTH RINSE
15.0000 mL | OROMUCOSAL | Status: DC
  Administered 2016-09-21 – 2016-09-27 (×64): 15 mL via OROMUCOSAL

## 2016-09-21 MED ORDER — SODIUM CHLORIDE 0.9 % IV SOLN
Freq: Once | INTRAVENOUS | Status: DC
Start: 2016-09-21 — End: 2016-09-21

## 2016-09-21 NOTE — ED Provider Notes (Signed)
[facility] DEPT [facility] Provider Note   CSN: [id number] Arrival date & time: 09/21/16  0754     History   Chief Complaint Chief Complaint  Patient presents with  . GI Bleeding    HPI Ray Hayden is a 51 y.o. male.  HPI Patient has known history of esophageal varices. Surgical treatment 12\12\2017 at [place]. Patient is a chronic alcoholic with cirrhosis. Last alcohol intake one week ago. Patient is currently undergoing alcohol rehabilitation therapy at [facility]. Today he vomited bright red blood and became very pale. EMS reports hypotensive pressures in the 70s on arrival and during transport. His mental status has been clear. He did not have further vomiting on route. Patient reports he has not felt good for a number of days. He reports some abdominal discomfort. He had not had any vomiting prior to this morning when he vomited bright red. He reports he is taking lactulose so he has frequent stool. He reports he is not looking at it, it may be dark but he has not made much note of it. He has not noted any bright red blood in stool. Patient denies recent use of aspirin or NSAID-type products. He does report he gets a lot of medications at [facility] and he is not sure what they all are however. Past Medical History:  Diagnosis Date  . Cirrhosis ([place])   . Hypertension     Patient Active Problem List   Diagnosis Date Noted  . LFTs abnormal   . GI bleed 09/21/2016  . Hematemesis   . Acute blood loss anemia   . Alcoholic cirrhosis of liver with ascites ([place])   . Ascites due to alcoholic cirrhosis ([place])   . Coagulopathy ([place])   . Hypovolemic shock ([place])   . Lactic acidosis   . Bleeding esophageal varices ([facility])     Past Surgical History:  Procedure Laterality Date  . ESOPHAGOGASTRODUODENOSCOPY N/A 09/21/2016   Procedure: ESOPHAGOGASTRODUODENOSCOPY (EGD);  Surgeon: Doran Stabler, MD;  Location: [facility] ENDOSCOPY;  Service: Endoscopy;  Laterality: N/A;        Home Medications    Prior to Admission medications   Medication Sig Start Date End Date Taking? Authorizing Provider  busPIRone (BUSPAR) 15 MG tablet Take 15 mg by mouth daily.    Yes Historical Provider, MD  carbamazepine (TEGRETOL) 200 MG tablet Take 100-200 mg by mouth as directed. TAPER 234m BID 4-16 to 4-18 (Pt has already had 5 doses) 1048mBID 4-19 to 4-21 10077maily 4-22 to 4-29 09/19/16 09/27/16 Yes Historical Provider, MD  furosemide (LASIX) 40 MG tablet Take 40 mg by mouth daily.   Yes Historical Provider, MD  gabapentin (NEURONTIN) 300 MG capsule Take 300 mg by mouth 3 (three) times daily.   Yes Historical Provider, MD  lactulose (CHRONULAC) 10 GM/15ML solution Take 10 g by mouth 2 (two) times daily as needed for mild constipation.   Yes Historical Provider, MD  magnesium oxide (MAG-OX) 400 MG tablet Take 400 mg by mouth 2 (two) times daily.   Yes Historical Provider, MD  Melatonin 3 MG TABS Take 1 tablet by mouth at bedtime.   Yes Historical Provider, MD  Multiple Vitamin (MULTIVITAMIN WITH MINERALS) TABS tablet Take 1 tablet by mouth daily.   Yes Historical Provider, MD  pantoprazole (PROTONIX) 40 MG tablet Take 40 mg by mouth 2 (two) times daily.    Yes Historical Provider, MD  spironolactone (ALDACTONE) 100 MG tablet Take 100 mg by mouth daily.  Yes Historical Provider, MD  thiamine (VITAMIN B-1) 100 MG tablet Take 100 mg by mouth daily.   Yes Historical Provider, MD  LORazepam (ATIVAN PO) Take 0.5-2 mg by mouth. Pt completed a taper from 64m to 0.543mfrom 4-14 to 4-15    Historical Provider, MD  venlafaxine XR (EFFEXOR-XR) 150 MG 24 hr capsule Take 150 mg by mouth daily with breakfast.    Historical Provider, MD    Family History History reviewed. No pertinent family history.  Social History Social History  Substance Use Topics  . Smoking status: Current Every Day Smoker    Types: Cigarettes  . Smokeless tobacco: Never Used  . Alcohol use Yes      Allergies   Sulfa antibiotics   Review of Systems Review of Systems 10 Systems reviewed and are negative for acute change except as noted in the HPI.   Physical Exam Updated Vital Signs BP (!) 94/58   Pulse 85   Temp 98.6 F (37 C)   Resp 16   Ht 6' (1.829 m)   Wt 241 lb 10 oz (109.6 kg)   SpO2 96%   BMI 32.77 kg/m   Physical Exam  Constitutional:  Patient is extremely pale and sallow. Mental status is clear. He appears fatigued and weak.  HENT:  Head: Normocephalic and atraumatic.  Tongue is stained with dark brownish black blood. Posterior airway is patent.  Eyes: EOM are normal.  Mild scleral icterus.  Neck: Neck supple.  Cardiovascular:  Tachycardia. No gross rub murmur gallop.  Pulmonary/Chest:  Patient is tachypneic. Maintaining her own airway. Does not show signs of respiratory fatigue. Anterior auscultation is clear.  Abdominal:  Abdomen is distended. Mild diffuse central tenderness. No guarding.  Genitourinary:  Genitourinary Comments: Rectal exam has dark blackish green stool. No [place] colored or bright red stool.     ED Treatments / Results  Labs (all labs ordered are listed, but only abnormal results are displayed) Labs Reviewed  COMPREHENSIVE METABOLIC PANEL - Abnormal; Notable for the following:       Result Value   Sodium 130 (*)    Chloride 99 (*)    CO2 21 (*)    Glucose, Bld 133 (*)    BUN 21 (*)    Calcium 7.3 (*)    Total Protein 5.2 (*)    Albumin 1.9 (*)    AST 62 (*)    Total Bilirubin 3.4 (*)    All other components within normal limits  LIPASE, BLOOD - Abnormal; Notable for the following:    Lipase 65 (*)    All other components within normal limits  CBC WITH DIFFERENTIAL/PLATELET - Abnormal; Notable for the following:    WBC 12.0 (*)    RBC 1.75 (*)    Hemoglobin 6.0 (*)    HCT 17.3 (*)    MCH 34.3 (*)    RDW 16.2 (*)    Platelets 115 (*)    Monocytes Absolute 2.4 (*)    All other components within normal  limits  PROTIME-INR - Abnormal; Notable for the following:    Prothrombin Time 25.5 (*)    All other components within normal limits  URINALYSIS, ROUTINE W REFLEX MICROSCOPIC - Abnormal; Notable for the following:    Color, Urine AMBER (*)    APPearance HAZY (*)    All other components within normal limits  RAPID URINE DRUG SCREEN, HOSP PERFORMED - Abnormal; Notable for the following:    Benzodiazepines POSITIVE (*)  All other components within normal limits  AMMONIA - Abnormal; Notable for the following:    Ammonia 78 (*)    All other components within normal limits  HEMOGLOBIN AND HEMATOCRIT, BLOOD - Abnormal; Notable for the following:    Hemoglobin 7.2 (*)    HCT 20.7 (*)    All other components within normal limits  HEMOGLOBIN AND HEMATOCRIT, BLOOD - Abnormal; Notable for the following:    Hemoglobin 9.1 (*)    HCT 27.5 (*)    All other components within normal limits  APTT - Abnormal; Notable for the following:    aPTT 40 (*)    All other components within normal limits  PROTIME-INR - Abnormal; Notable for the following:    Prothrombin Time 24.5 (*)    All other components within normal limits  LACTIC ACID, PLASMA - Abnormal; Notable for the following:    Lactic Acid, Venous 7.6 (*)    All other components within normal limits  CBC - Abnormal; Notable for the following:    WBC 13.8 (*)    RBC 2.50 (*)    Hemoglobin 7.7 (*)    HCT 22.8 (*)    RDW 18.4 (*)    Platelets 83 (*)    All other components within normal limits  PROTIME-INR - Abnormal; Notable for the following:    Prothrombin Time 25.1 (*)    All other components within normal limits  LACTIC ACID, PLASMA - Abnormal; Notable for the following:    Lactic Acid, Venous 5.3 (*)    All other components within normal limits  HEMOGLOBIN AND HEMATOCRIT, BLOOD - Abnormal; Notable for the following:    Hemoglobin 7.5 (*)    HCT 22.4 (*)    All other components within normal limits  HEMOGLOBIN AND HEMATOCRIT,  BLOOD - Abnormal; Notable for the following:    Hemoglobin 8.5 (*)    HCT 24.3 (*)    All other components within normal limits  GLUCOSE, CAPILLARY - Abnormal; Notable for the following:    Glucose-Capillary 146 (*)    All other components within normal limits  GLUCOSE, CAPILLARY - Abnormal; Notable for the following:    Glucose-Capillary 125 (*)    All other components within normal limits  GLUCOSE, CAPILLARY - Abnormal; Notable for the following:    Glucose-Capillary 119 (*)    All other components within normal limits  CBC - Abnormal; Notable for the following:    WBC 14.3 (*)    RBC 2.51 (*)    Hemoglobin 7.8 (*)    HCT 23.1 (*)    RDW 18.8 (*)    Platelets 102 (*)    All other components within normal limits  COMPREHENSIVE METABOLIC PANEL - Abnormal; Notable for the following:    Potassium 6.7 (*)    Glucose, Bld 131 (*)    BUN 40 (*)    Calcium 5.7 (*)    Total Protein 4.5 (*)    Albumin 1.9 (*)    AST 633 (*)    ALT 182 (*)    Total Bilirubin 5.1 (*)    All other components within normal limits  MAGNESIUM - Abnormal; Notable for the following:    Magnesium 1.2 (*)    All other components within normal limits  LACTIC ACID, PLASMA - Abnormal; Notable for the following:    Lactic Acid, Venous 4.3 (*)    All other components within normal limits  POTASSIUM - Abnormal; Notable for the following:    Potassium 6.6 (*)  All other components within normal limits  BASIC METABOLIC PANEL - Abnormal; Notable for the following:    Potassium 5.8 (*)    Glucose, Bld 136 (*)    BUN 44 (*)    Calcium 6.5 (*)    All other components within normal limits  CBC - Abnormal; Notable for the following:    WBC 12.0 (*)    RBC 2.36 (*)    Hemoglobin 7.4 (*)    HCT 21.9 (*)    RDW 18.8 (*)    Platelets 89 (*)    All other components within normal limits  GLUCOSE, CAPILLARY - Abnormal; Notable for the following:    Glucose-Capillary 141 (*)    All other components within normal  limits  BASIC METABOLIC PANEL - Abnormal; Notable for the following:    Potassium 5.8 (*)    Glucose, Bld 137 (*)    BUN 45 (*)    Calcium 6.4 (*)    All other components within normal limits  GLUCOSE, CAPILLARY - Abnormal; Notable for the following:    Glucose-Capillary 121 (*)    All other components within normal limits  BASIC METABOLIC PANEL - Abnormal; Notable for the following:    Glucose, Bld 119 (*)    BUN 46 (*)    Calcium 6.5 (*)    All other components within normal limits  CBC - Abnormal; Notable for the following:    RBC 2.71 (*)    Hemoglobin 8.2 (*)    HCT 24.5 (*)    RDW 19.2 (*)    Platelets 90 (*)    All other components within normal limits  MAGNESIUM - Abnormal; Notable for the following:    Magnesium 1.4 (*)    All other components within normal limits  AMMONIA - Abnormal; Notable for the following:    Ammonia 97 (*)    All other components within normal limits  VITAMIN B12 - Abnormal; Notable for the following:    Vitamin B-12 1,081 (*)    All other components within normal limits  IRON AND TIBC - Abnormal; Notable for the following:    TIBC 174 (*)    All other components within normal limits  FERRITIN - Abnormal; Notable for the following:    Ferritin 576 (*)    All other components within normal limits  RETICULOCYTES - Abnormal; Notable for the following:    Retic Ct Pct 5.2 (*)    RBC. 2.76 (*)    All other components within normal limits  HEMOGLOBIN AND HEMATOCRIT, BLOOD - Abnormal; Notable for the following:    Hemoglobin 8.3 (*)    HCT 25.0 (*)    All other components within normal limits  GLUCOSE, CAPILLARY - Abnormal; Notable for the following:    Glucose-Capillary 144 (*)    All other components within normal limits  BASIC METABOLIC PANEL - Abnormal; Notable for the following:    Glucose, Bld 131 (*)    BUN 45 (*)    Calcium 6.5 (*)    All other components within normal limits  GLUCOSE, CAPILLARY - Abnormal; Notable for the  following:    Glucose-Capillary 137 (*)    All other components within normal limits  GLUCOSE, CAPILLARY - Abnormal; Notable for the following:    Glucose-Capillary 120 (*)    All other components within normal limits  GLUCOSE, CAPILLARY - Abnormal; Notable for the following:    Glucose-Capillary 128 (*)    All other components within normal limits  GLUCOSE,  CAPILLARY - Abnormal; Notable for the following:    Glucose-Capillary 129 (*)    All other components within normal limits  GLUCOSE, CAPILLARY - Abnormal; Notable for the following:    Glucose-Capillary 133 (*)    All other components within normal limits  BASIC METABOLIC PANEL - Abnormal; Notable for the following:    Glucose, Bld 149 (*)    BUN 36 (*)    Calcium 7.1 (*)    All other components within normal limits  CBC - Abnormal; Notable for the following:    RBC 2.87 (*)    Hemoglobin 8.8 (*)    HCT 26.1 (*)    RDW 19.4 (*)    Platelets 109 (*)    All other components within normal limits  PHOSPHORUS - Abnormal; Notable for the following:    Phosphorus 2.1 (*)    All other components within normal limits  HEPATIC FUNCTION PANEL - Abnormal; Notable for the following:    Total Protein 5.4 (*)    Albumin 2.1 (*)    AST 670 (*)    ALT 277 (*)    Total Bilirubin 7.4 (*)    Bilirubin, Direct 3.5 (*)    Indirect Bilirubin 3.9 (*)    All other components within normal limits  PROTIME-INR - Abnormal; Notable for the following:    Prothrombin Time 23.6 (*)    All other components within normal limits  GLUCOSE, CAPILLARY - Abnormal; Notable for the following:    Glucose-Capillary 141 (*)    All other components within normal limits  GLUCOSE, CAPILLARY - Abnormal; Notable for the following:    Glucose-Capillary 130 (*)    All other components within normal limits  GLUCOSE, CAPILLARY - Abnormal; Notable for the following:    Glucose-Capillary 147 (*)    All other components within normal limits  AMMONIA - Abnormal;  Notable for the following:    Ammonia 88 (*)    All other components within normal limits  GLUCOSE, CAPILLARY - Abnormal; Notable for the following:    Glucose-Capillary 141 (*)    All other components within normal limits  GLUCOSE, CAPILLARY - Abnormal; Notable for the following:    Glucose-Capillary 136 (*)    All other components within normal limits  I-STAT CHEM 8, ED - Abnormal; Notable for the following:    Sodium 133 (*)    Chloride 99 (*)    BUN 24 (*)    Glucose, Bld 126 (*)    Calcium, Ion 0.96 (*)    Hemoglobin 7.5 (*)    HCT 22.0 (*)    All other components within normal limits  I-STAT CG4 LACTIC ACID, ED - Abnormal; Notable for the following:    Lactic Acid, Venous 4.35 (*)    All other components within normal limits  I-STAT VENOUS BLOOD GAS, ED - Abnormal; Notable for the following:    pH, Ven 7.473 (*)    pCO2, Ven 29.9 (*)    All other components within normal limits  I-STAT ARTERIAL BLOOD GAS, ED - Abnormal; Notable for the following:    pH, Arterial 7.235 (*)    Bicarbonate 18.3 (*)    Acid-base deficit 8.0 (*)    All other components within normal limits  POCT I-STAT 3, ART BLOOD GAS (G3+) - Abnormal; Notable for the following:    pH, Arterial 7.200 (*)    Bicarbonate 14.8 (*)    Acid-base deficit 12.0 (*)    All other components within normal limits  POCT I-STAT 3, ART BLOOD GAS (G3+) - Abnormal; Notable for the following:    pH, Arterial 7.339 (*)    pO2, Arterial 79.0 (*)    Bicarbonate 19.0 (*)    Acid-base deficit 6.0 (*)    All other components within normal limits  POCT I-STAT 3, ART BLOOD GAS (G3+) - Abnormal; Notable for the following:    pO2, Arterial 73.0 (*)    Acid-base deficit 3.0 (*)    All other components within normal limits  MRSA PCR SCREENING  CULTURE, RESPIRATORY (NON-EXPECTORATED)  ETHANOL  TRIGLYCERIDES  PHOSPHORUS  NA AND K (SODIUM & POTASSIUM), RAND UR  PROCALCITONIN  PHOSPHORUS  PROCALCITONIN  FOLATE  PROCALCITONIN   MAGNESIUM  LACTIC ACID, PLASMA  BLOOD GAS, VENOUS  TYPE AND SCREEN  PREPARE FRESH FROZEN PLASMA  PREPARE RBC (CROSSMATCH)  PREPARE FRESH FROZEN PLASMA  ABO/RH  PREPARE RBC (CROSSMATCH)  BLOOD PRODUCT ORDER (VERBAL) VERIFICATION  PREPARE RBC (CROSSMATCH)  PREPARE FRESH FROZEN PLASMA    EKG  EKG Interpretation  Date/Time:  Wednesday September 21 2016 07:57:28 EDT Ventricular Rate:  126 PR Interval:    QRS Duration: 88 QT Interval:  315 QTC Calculation: 455 R Axis:   37 Text Interpretation:  Sinus tachycardia Borderline ST depression, inferior leads Baseline wander in lead(s) I II aVR aVL aVF V3 Confirmed by Lacinda Axon  MD, BRIAN ([id number]) on 09/22/2016 3:34:00 PM       Radiology Dg Chest Port 1 View  Result Date: 09/24/2016 CLINICAL DATA:  Ventilator dependent. EXAM: PORTABLE CHEST 1 VIEW COMPARISON:  09/23/2016 FINDINGS: Endotracheal tube terminates 3.3 cm above the carina. Left jugular catheter terminates over the upper SVC. Cardiomediastinal silhouette is unchanged. Lung volumes remain diminished with mild pulmonary vascular congestion. Patchy left greater than right base airspace opacities have mildly improved. There may be a small left pleural effusion. No pneumothorax is seen. IMPRESSION: Persistent low lung volumes with slightly improved aeration of the lung bases. Electronically Signed   By: Logan Bores M.D.   On: 09/24/2016 07:18   Dg Chest Port 1 View  Result Date: 09/23/2016 CLINICAL DATA:  Endotracheal tube.  Respiratory failure. EXAM: PORTABLE CHEST 1 VIEW COMPARISON:  09/22/2016 FINDINGS: Endotracheal tube terminates 2.8 cm above carina.Left internal jugular line tip unchanged with tip at high SVC. Mildly degraded exam due to AP portable technique and patient body habitus. Cardiomegaly accentuated by AP portable technique. Probable layering left pleural effusion. No pneumothorax. Mild pulmonary venous congestion, superimposed upon low lung volumes. Left worse than right base  airspace disease. IMPRESSION: Mild pulmonary venous congestion. Layering left pleural effusion with bibasilar airspace disease, similar. Electronically Signed   By: Abigail Miyamoto M.D.   On: 09/23/2016 08:11    Procedures Procedure Name: Intubation Date/Time: 09/21/2016 8:50 AM Performed by: Charlesetta Shanks Pre-anesthesia Checklist: Patient identified, Emergency Drugs available, Suction available and Patient being monitored Oxygen Delivery Method: Non-rebreather mask Preoxygenation: Pre-oxygenation with 100% oxygen Intubation Type: Rapid sequence Laryngoscope Size: Glidescope and 4 Grade View: Grade I Tube size: 7.5 mm Number of attempts: 1 Airway Equipment and Method: Video-laryngoscopy Placement Confirmation: ETT inserted through vocal cords under direct vision,  Positive ETCO2,  CO2 detector and Breath sounds checked- equal and bilateral Secured at: 22 cm Tube secured with: ETT holder Dental Injury: Teeth and Oropharynx as per pre-operative assessment  Comments: RSI intubation without difficulty. No oxygen desaturation.      (including critical care time) About 10 minutes after patient had been successfully intubated, he had awakened prior  to initiation of fentanyl drip and very quickly became agitated and combative pulling his tube. The  therapist reported air leak but deflated and reinflated tube without air leak and maintained oxygen saturation at 99%. I did then reassess the tube for placement with repeat administration of Versed 10 and succinylcholine 100 mg. Under direct visualization with a glidescope, balloon was mostly extruded but still obstructing the glottis with the distal tip in place. There is significant pooling of red blood in the oropharynx. This is suctioned continuously, balloon was deflated and tube advanced back into position. With tube in position could visualize the balloon below the cords and obstructing the airway appropriately.  CRITICAL CARE Performed by:  Charlesetta Shanks   Total critical care time: 60  minutes  Critical care time was exclusive of separately billable procedures and treating other patients.  Critical care was necessary to treat or prevent imminent or life-threatening deterioration.  Critical care was time spent personally by me on the following activities: development of treatment plan with patient and/or surrogate as well as nursing, discussions with consultants, evaluation of patient's response to treatment, examination of patient, obtaining history from patient or surrogate, ordering and performing treatments and interventions, ordering and review of laboratory studies, ordering and review of radiographic studies, pulse oximetry and re-evaluation of patient's condition. Medications Ordered in ED Medications  pantoprazole (PROTONIX) injection 40 mg ( Intravenous MAR Unhold 09/21/16 1530)  fentaNYL (SUBLIMAZE) 100 MCG/2ML injection (not administered)  midazolam (VERSED) 2 MG/2ML injection (not administered)  fentaNYL 2539mg in NS 2546m(1040mml) infusion-PREMIX (100 mcg/hr Intravenous Rate/Dose Change 09/24/16 0825)  fentaNYL (SUBLIMAZE) bolus via infusion 50 mcg ( Intravenous MAR Unhold 09/21/16 1530)  midazolam (VERSED) injection 2 mg (2 mg Intravenous Given 09/24/16 0245)  0.9 %  sodium chloride infusion ( Intravenous Restarted 09/24/16 1420)  thiamine (B-1) injection 100 mg (100 mg Intravenous Given 09/30/02/5410981folic acid injection 1 mg (1 mg Intravenous Given 09/24/16 1012)  chlorhexidine gluconate (MEDLINE KIT) (PERIDEX) 0.12 % solution 15 mL (15 mLs Mouth Rinse Given 09/24/16 0800)  MEDLINE mouth rinse (15 mLs Mouth Rinse Given 09/24/16 1400)  norepinephrine (LEVOPHED) 16 mg in dextrose 5 % 250 mL (0.064 mg/mL) infusion (4 mcg/min Intravenous Rate/Dose Change 09/24/16 1000)  sodium chloride flush (NS) 0.9 % injection 10-40 mL (10 mLs Intracatheter Given 09/24/16 1012)  sodium chloride flush (NS) 0.9 % injection 10-40 mL (not  administered)  Chlorhexidine Gluconate Cloth 2 % PADS 6 each (6 each Topical Given 09/24/16 1013)  insulin aspart (novoLOG) injection 2-6 Units (2 Units Subcutaneous Given 09/24/16 1229)  piperacillin-tazobactam (ZOSYN) IVPB 3.375 g (0 g Intravenous Stopped 09/24/16 1420)  dexmedetomidine (PRECEDEX) 400 MCG/100ML (4 mcg/mL) infusion (0.6 mcg/kg/hr  108.8 kg Intravenous New Bag/Given 09/24/16 1309)  sodium chloride 0.9 % bolus 500 mL (0 mLs Intravenous Stopped 09/21/16 1001)  pantoprazole (PROTONIX) 80 mg in sodium chloride 0.9 % 100 mL IVPB (0 mg Intravenous Stopped 09/21/16 1321)  0.9 %  sodium chloride infusion ( Intravenous Stopped 09/21/16 1300)  octreotide (SANDOSTATIN) 2 mcg/mL load via infusion 50 mcg (50 mcg Intravenous Bolus from Bag 09/21/16 0901)  etomidate (AMIDATE) injection 25.18 mg (20 mg Intravenous Given 09/21/16 0845)  succinylcholine (ANECTINE) injection 120 mg (120 mg Intravenous Given 09/21/16 0845)  ondansetron (ZOFRAN) 4 MG/2ML injection (4 mg  Given 09/21/16 0844)  LORazepam (ATIVAN) injection 2 mg (2 mg Intravenous Given 09/21/16 0900)  midazolam (VERSED) 2 MG/2ML injection (8 mg  Given 09/21/16 0915)  vecuronium (NORCURON) 10 MG  injection (10 mg  Given 09/21/16 0923)  iopamidol (ISOVUE-300) 61 % injection (100 mLs  Contrast Given 09/21/16 2210)  albumin human 25 % solution 12.5 g (0 g Intravenous Stopped 09/22/16 1100)  insulin aspart (novoLOG) injection 10 Units (10 Units Intravenous Given 09/22/16 1038)  dextrose 50 % solution 50 mL (50 mLs Intravenous Given 09/22/16 1039)  calcium chloride 1 g in sodium chloride 0.9 % 100 mL IVPB (0 g Intravenous Stopped 09/22/16 1200)  lactulose (CHRONULAC) enema 200 gm (300 mLs Rectal Given 09/22/16 1228)  piperacillin-tazobactam (ZOSYN) IVPB 3.375 g (0 g Intravenous Stopped 09/22/16 1245)  sodium polystyrene (KAYEXALATE) 15 GM/60ML suspension 15 g (15 g Rectal Given 09/22/16 1618)  0.9 %  sodium chloride infusion ( Intravenous Stopped 09/22/16 1946)   sodium chloride 0.9 % bolus 500 mL (0 mLs Intravenous Stopped 09/23/16 0708)  furosemide (LASIX) injection 40 mg (40 mg Intravenous Given 09/22/16 2151)  lactulose (CHRONULAC) enema 200 gm (300 mLs Rectal Given 09/23/16 2840)  calcium gluconate 1 g in sodium chloride 0.9 % 100 mL IVPB (0 g Intravenous Stopped 09/23/16 0918)  furosemide (LASIX) injection 40 mg (40 mg Intravenous Given 09/24/16 1423)     Initial Impression / Assessment and Plan / ED Course  I have reviewed the triage vital signs and the nursing notes.  Pertinent labs & imaging results that were available during my care of the patient were reviewed by me and considered in my medical decision making (see chart for details).    (08: 25) consult gastroenterology for active upper GI bleed. Will evaluate in the emergency department.  Patient has been seen by Verdell Face has reviewed with Dr.Danis and Dr Halford Chessman.  (09:35) discussed by phone with Dr.Danis to update of active bleeding. Is now at [facility] facility and will be seeing the patient in the emergency department.   Final Clinical Impressions(s) / ED Diagnoses   Final diagnoses:  Abdominal distension  Encounter for central line placement  Secondary esophageal varices with bleeding (HCC)  Upper GI bleed  Liver disease, chronic, with cirrhosis [facility])    New Prescriptions Current Discharge Medication List       Charlesetta Shanks, MD 09/24/16 1552

## 2016-09-21 NOTE — ED Notes (Signed)
5 mg Vit K given in left hand IV

## 2016-09-21 NOTE — ED Notes (Signed)
Ray Hayden) [phone] girlfriend -- pt states can tell her anything about his care.  Pt did talk with her prior to being intubated.

## 2016-09-21 NOTE — Consult Note (Addendum)
[facility] Consult: 8:35 AM 09/21/2016  LOS: 0 days    Referring Provider: ED MD Dr Lowella Bandy  Primary Care Physician:  Marcelo Baldy, MD Primary Gastroenterologist:  Dr. Harlen Labs.       Reason for Consultation:  GI bleed.    HPI: Ray Hayden is a 51 y.o. male.  PMH ETOH cirrhosis.  Hx GI bleeds.  Ascites, paracentesis 05/2016 x 3 (2.6, ?? , 4.6 liters, no SBP). ETOH hepatitis.  Hep B immune.   Depression.  Blood loss anemia/transfusion 04/2016.  Thrombocytopenia. OSA.  s/p appendectomy.  s/p hernia repair.  Right lung nodule per CT 05/2015.  Negative cardiac stress test in 06/2013.   GI bleed, ICU stay in 12/2015.   04/2016 EGD: banding of esophageal varices.  No report 05/2016 CT scan " Small hypodensity in segment 6 too small to characterize. Mild hepatic steatosis. Mild hepatomegaly, the liver measures up to 18.5 cm." 05/2016 EGD: varices. No report.   07/19/16 EGD. Actual report not in Care Everywhere.  07/19/16 Colonoscopy.  Report not in CE.  But pathology report: Colon polyp, tubular adenoma.    Meds in past have included Propanolol.  Aldactone 100 mg, Lasix 40 mg.   Arrested for DUI 4/12.  Drinking daily.   Seen in ED on 09/16/16 due to tremor post release from jail.   Entered [facility] ETOH rehab/detox 4/14.    Developed dark brown emesis at [facility].  BP 70/40 at arrival of EMS.  Brought to ED, single episode red blood emesis.      Hgb 11.5 on 4/13.. 6.0 at 0800 today.  Was 10 on 05/24/16.   BUN 24.   PT/INR 25/2.2.  Protonix, Octreotide drip ordered.  PRBC x 2 and FFP x 2 ordered.  Intubated.  Initially calm and able to provide hx but became agitated after intubation, now calm after getting meds.     Past Medical History:  Diagnosis Date  . Cirrhosis (HCC)   . Hypertension      History reviewed. No pertinent surgical history.  Prior to Admission medications   Medication Sig Start Date End Date Taking? Authorizing Provider  busPIRone (BUSPAR) 15 MG tablet Take 15 mg by mouth 3 (three) times daily.    Historical Provider, MD  chlordiazePOXIDE (LIBRIUM) 25 MG capsule  PO TID x 1D, then 25-50mg  PO BID X 1D, then 25-50mg  PO QD X 1D 09/16/16   Maia Plan, MD  clonazePAM (KLONOPIN) 1 MG tablet Take 1 mg by mouth 2 (two) times daily as needed for anxiety.    Historical Provider, MD  furosemide (LASIX) 40 MG tablet Take 40 mg by mouth daily.    Historical Provider, MD  gabapentin (NEURONTIN) 300 MG capsule Take 300 mg by mouth 3 (three) times daily.    Historical Provider, MD  lactulose (CHRONULAC) 10 GM/15ML solution Take 10 g by mouth 2 (two) times daily as needed for mild constipation.    Historical Provider, MD  Melatonin 3 MG TABS Take 1 tablet by mouth at bedtime.    Historical Provider, MD  pantoprazole (PROTONIX) 40 MG tablet Take 40 mg by mouth daily.    Historical Provider, MD  propranolol (INDERAL) 10 MG tablet Take 10 mg by mouth 3 (three) times daily.    Historical Provider, MD  spironolactone (ALDACTONE) 100 MG tablet Take 100 mg by mouth daily.    Historical Provider, MD  venlafaxine XR (EFFEXOR-XR) 150 MG 24 hr capsule Take 150 mg by mouth daily with breakfast.    Historical Provider, MD    Scheduled Meds: . etomidate  0.3 mg/kg Intravenous Once  . octreotide  50 mcg Intravenous Once  . [START ON 09/24/2016] pantoprazole  40 mg Intravenous Q12H  . succinylcholine  120 mg Intravenous Once   Infusions: . sodium chloride    . octreotide  (SANDOSTATIN)    IV infusion    . pantoprazole (PROTONIX) IVPB    . pantoprozole (PROTONIX) infusion    . sodium chloride     PRN Meds:    Allergies as of 09/21/2016 - Review Complete 09/16/2016  Allergen Reaction Noted  . Sulfa antibiotics  09/16/2016    No family history on file.  Social History    Social History  . Marital status: Single    Spouse name: N/A  . Number of children: N/A  . Years of education: N/A   Occupational History  . Not on file.   Social History Main Topics  . Smoking status: Current Every Day Smoker  . Smokeless tobacco: Never Used  . Alcohol use Yes  . Drug use: No  . Sexual activity: Not on file   Other Topics Concern  . Not on file   Social History Narrative  . No narrative on file    REVIEW OF SYSTEMS: Pt intubated, sedated.  Unable to obtain  PHYSICAL EXAM: Vital signs in last 24 hours: Vitals:   09/21/16 0800 09/21/16 0807  BP:  (!) 90/56  Pulse:    Resp:    Temp: 98.9 F (37.2 C)    Wt Readings from Last 3 Encounters:  09/21/16 83.9 kg (185 lb)    General: looks ashen, acutely ill.  WM.   Head:  No asymmetry or signs of trauma.    Eyes:  No icterus or conj pallor Ears:  Unable to assess hearing  Nose:  No discharge or blood Mouth:  Intubated, OG in place.  Blood in mouth Neck:  No mass, no  TMG Lungs:  Clear bil.  Intubated on vent Heart: tachy, regular Abdomen:  Soft, bulging flanks suggestive of ascites.  No mass, no hernia, no organomegaly.   Rectal: deferred   Musc/Skeltl: no joint deformities, swelling or erythema.   Extremities:  No CCE.  Feet warm and well perfused  Neurologic:  Sedated.  Initially was agitated and moving all 4 limbs Skin:  No sores or rash.  + telangectasias on upper torso.      Intake/Output from previous day: No intake/output data recorded. Intake/Output this shift: No intake/output data recorded.  LAB RESULTS:  Recent Labs  09/21/16 0813  HGB 7.5*  HCT 22.0*   BMET Lab Results  Component Value Date   NA 133 (L) 09/21/2016   NA 135 09/16/2016   K 4.7 09/21/2016   K 3.5 09/16/2016   CL 99 (L) 09/21/2016   CL 100 (L) 09/16/2016   CO2 25 09/16/2016   GLUCOSE 126 (H) 09/21/2016   GLUCOSE 144 (H) 09/16/2016   BUN 24 (H) 09/21/2016   BUN 10 09/16/2016   CREATININE 1.00  09/21/2016  CREATININE 0.55 (L) 09/16/2016   CALCIUM 8.6 (L) 09/16/2016   LFT No results for input(s): PROT, ALBUMIN, AST, ALT, ALKPHOS, BILITOT, BILIDIR, IBILI in the last 72 hours. PT/INR No results found for: INR, PROTIME Hepatitis Panel No results for input(s): HEPBSAG, HCVAB, HEPAIGM, HEPBIGM in the last 72 hours. C-Diff No components found for: CDIFF Lipase     Component Value Date/Time   LIPASE 48 09/16/2016 1520    Drugs of Abuse  No results found for: LABOPIA, COCAINSCRNUR, LABBENZ, AMPHETMU, THCU, LABBARB   RADIOLOGY STUDIES: Dg Chest Port 1 View  Result Date: 09/21/2016 CLINICAL DATA:  Abdominal pain EXAM: PORTABLE CHEST 1 VIEW COMPARISON:  None. FINDINGS: Cardiac shadow is within normal limits. Poor inspiratory effort is seen with mild right basilar atelectasis. No bony abnormality is noted. IMPRESSION: Poor inspiratory effort with mild right basilar atelectasis. Electronically Signed   By: Alcide Clever M.D.   On: 09/21/2016 08:25     IMPRESSION:   *  Hematemesis, UGIB in pt with known esophageal varices.  Not able to determine if varices have bled in past, but it seems likely, has undergone previous banding at [facility].     On Protonix, Octreotide drips.    *  Blood loss anemia.  PRBC x 2 ordered, # 1 completed.   *  coagulopathy.  FFP x 2 ordered, not yet started.      PLAN:     *  Ordered Rocephin.  EGD orders placed.  Will be done in Endo or, if gets ICU bed, later this afternoon.  Needs to get FFP before EGD.   Apparently there is a girlfriend who is his contact person, not sure if she has HCPOA.  Her phone # is not in the system.    Jennye Moccasin  09/21/2016, 8:35 AM Pager: [phone]  I have reviewed the entire case in detail with the above APP and discussed the plan in detail.  Therefore, I agree with the diagnoses recorded above. In addition,  I have personally interviewed and examined the patient and have personally reviewed any abdominal/pelvic CT  scan images.  My additional thoughts are as follows:  Massive UGI bleed in cirrhotic patient - likely variceal. Unstable from hypovolemic shock and resultant lactic acidosis. Anemia of acute GI blood loss Portal hypertensive ascites   Emergent bedside EGD.  No contact numbers for any NOK can be found at this time. Dr Lowella Bandy (ED) and I have agreed this is emergent and necessary and have cosigned a consent form.  Needs Ceftriaxone 1 gram once daily for UGIB in setting of ascites.  Has good IV access , rec'd blood products as well as IV protonix and octreotide.  Patient at increased risk for cardiopulmonary complications of procedure due to medical comorbidities.    Charlie Pitter III Pager [phone]  Mon-Fri 8a-5p [phone] after 5p, weekends, holidays

## 2016-09-21 NOTE — ED Notes (Signed)
1U PRBCs Unit # [id number] Z started.

## 2016-09-21 NOTE — Anesthesia Preprocedure Evaluation (Deleted)
Anesthesia Evaluation  Patient identified by MRN, date of birth, ID band  Reviewed: Allergy & Precautions, H&P , NPO status , [name] Chart, lab work & pertinent test results  Airway Mallampati: Intubated       Dental no notable dental hx. (+) Teeth Intact, Dental Advisory Given   Pulmonary Current Smoker,    Pulmonary exam normal breath sounds clear to auscultation       Cardiovascular hypertension, Pt. on medications  Rhythm:Regular Rate:Normal     Neuro/Psych negative neurological ROS  negative psych ROS   GI/Hepatic Medicated and Controlled,(+) Cirrhosis   Esophageal Varices    ,   Endo/Other  negative endocrine ROS  Renal/GU negative Renal ROS  negative genitourinary   Musculoskeletal   Abdominal   Peds  Hematology negative hematology ROS (+) anemia ,   Anesthesia Other Findings   Reproductive/Obstetrics negative OB ROS                             Anesthesia Physical Anesthesia Plan  ASA: III and emergent  Anesthesia Plan: General   Post-op Pain Management:    Induction: Intravenous  Airway Management Planned:   Additional Equipment:   Intra-op Plan:   Post-operative Plan: Post-operative intubation/ventilation  Informed Consent: I have reviewed the patients History and Physical, chart, labs and discussed the procedure including the risks, benefits and alternatives for the proposed anesthesia with the patient or authorized representative who has indicated his/her understanding and acceptance.   Dental advisory given  Plan Discussed with: CRNA  Anesthesia Plan Comments:         Anesthesia Quick Evaluation

## 2016-09-21 NOTE — ED Notes (Signed)
Endo at bedside

## 2016-09-21 NOTE — Progress Notes (Signed)
Per ED RN pt received 6u of FFP and 6u of PRBC alone with  Vitamin K

## 2016-09-21 NOTE — Procedures (Signed)
Central Venous Catheter Insertion Procedure Note Ray Hayden [id number] Nov 23, 1965  Procedure: Insertion of Central Venous Catheter Indications: Assessment of intravascular volume, Drug and/or fluid administration and Frequent blood sampling  Procedure Details Consent: Unable to obtain consent because of emergent medical necessity. Time Out: Verified patient identification, verified procedure, site/side was marked, verified correct patient position, special equipment/implants available, medications/allergies/relevent history reviewed, required imaging and test results available.  Performed  Maximum sterile technique was used including antiseptics, cap, gloves, gown, hand hygiene, mask and sheet. Skin prep: Chlorhexidine; local anesthetic administered A antimicrobial bonded/coated triple lumen catheter was placed in the left internal jugular vein using the Seldinger technique.  Evaluation Blood flow good Complications: No apparent complications Patient did tolerate procedure well. Chest X-ray ordered to verify placement.  CXR: pending.  Ray Hayden, AG-ACNP [facility]  Pgr: [phone]  PCCM Pgr: [phone]

## 2016-09-21 NOTE — Progress Notes (Signed)
Progress Note   While at patient bedside for central line placement, bedside nurse pointed out increasing distention of abdomen.   Physical Exam  Constitutional: No distress.  Cardiovascular: Exam reveals no gallop and no friction rub.   No murmur heard. Pulmonary/Chest: No respiratory distress.  Abdominal: He exhibits distension.  Hypoactive bowel sounds  Neurological:  Sedated, does not follow commands   Hemorrhagic Shock in setting of GI Bleed, Liver cirrhosis s/p esophageal varices  -Patient is +6.6 L with CVP of 14-15 -Lactic Acid trending up 4.35 > 7.6 Plan  -GI has recommended no OGT in setting of banding > will place rectal tube for decompression  -Will obtain CT A/P with contrast to r/o ischemia  -Continue to Trend Lactic Acid  -Trend CVP -Change Neo to Levophed   [name], AG-ACNP [facility]  Pgr: [phone]  PCCM Pgr: [phone]

## 2016-09-21 NOTE — ED Notes (Signed)
FFP [id number] S started

## 2016-09-21 NOTE — ED Notes (Signed)
4th unit FFP started at 999cc/hr

## 2016-09-21 NOTE — Progress Notes (Signed)
eLink Physician-Brief Progress Note Patient Name: Ray Hayden DOB: Jun 17, 1965 MRN: [id number]   Date of Service  09/21/2016  HPI/Events of Note  Admitted for GIB, hem shock, worsneing BP on 200 mcg of NEO  eICU Interventions  APP notified of CVL need and will obtain ABG and start Bicarb infusion, will assess more IVF and blood as needed     Intervention Category Major Interventions: Hypotension - evaluation and management  Roshun Klingensmith 09/21/2016, 3:17 PM

## 2016-09-21 NOTE — ED Notes (Signed)
FFP finished 3rd unit FFP started -- [id number] S

## 2016-09-21 NOTE — Op Note (Signed)
[facility] Patient Name: Ray Hayden Procedure Date : 09/21/2016 MRN: [id number] Attending MD: Starr Lake. Myrtie Neither , MD Date of Birth: 1966/01/18 CSN: [id number] Age: 51 Admit Type: Outpatient Procedure:                Upper GI endoscopy Indications:              Acute post hemorrhagic anemia, Hematemesis, Active                            gastrointestinal bleeding, cirrhosis with previous                            ligation of esophageal varices Providers:                Sherilyn Cooter L. Myrtie Neither, MD, Tillie Fantasia, RN, Oletha Blend, Technician Referring MD:              Medicines:                Propofol and fentanyl per ED and critical care                            service Complications:            No immediate complications. Estimated Blood Loss:     Estimated blood loss: none. Procedure:                Pre-Anesthesia Assessment:                           - Prior to the procedure, a History and Physical                            was performed, and patient medications and                            allergies were reviewed. The patient's tolerance of                            previous anesthesia was also reviewed. The risks                            and benefits of the procedure and the sedation                            options and risks were discussed with the patient.                            All questions were answered, and informed consent                            was obtained. Prior Anticoagulants: The patient has                            taken  no previous anticoagulant or antiplatelet                            agents. ASA Grade Assessment: IV - A patient with                            severe systemic disease that is a constant threat                            to life. After reviewing the risks and benefits,                            the patient was deemed in satisfactory condition to                            undergo the  procedure.                           After obtaining informed consent, the endoscope was                            passed under direct vision. Throughout the                            procedure, the patient's blood pressure, pulse, and                            oxygen saturations were monitored continuously. The                            EG-[id number] ([id number]) scope was introduced through the                            mouth, and advanced to the second part of duodenum.                            The upper GI endoscopy was accomplished without                            difficulty. The patient tolerated the procedure                            well. Scope In: Scope Out: Findings:      Three columns of oozing grade III varices were found in the lower third       of the esophagus,. Red wale signs were present. Four bands were       successfully placed with complete eradication, resulting in deflation of       varices. Bleeding had stopped at the end of the procedure.      Red blood was found in the entire examined stomach.      The examined duodenum was normal. Impression:               - Bleeding grade III esophageal varices. Completely  eradicated. Banded.                           - Red blood in the entire stomach.                           - Normal examined duodenum.                           - No specimens collected. Moderate Sedation:      see above Recommendation:           - NPO.                           - Continue present medications.                           - Continue supportive care in ICU with blood                            products, serial Hgb/Hct, Antibiotics, octreotide                            and pantoprazole. Procedure Code(s):        --- Professional ---                           [phone], Esophagogastroduodenoscopy, flexible,                            transoral; with band ligation of esophageal/gastric                             varices Diagnosis Code(s):        --- Professional ---                           I85.01, Esophageal varices with bleeding                           K92.2, Gastrointestinal hemorrhage, unspecified                           D62, Acute posthemorrhagic anemia                           K92.0, Hematemesis CPT copyright 2016 American Medical Association. All rights reserved. The codes documented in this report are preliminary and upon coder review may  be revised to meet current compliance requirements. Henry L. Myrtie Neither, MD 09/21/2016 1:05:19 PM This report has been signed electronically. Number of Addenda: 0

## 2016-09-21 NOTE — Op Note (Signed)
Procedure:   EGD with variceal banding  Meds:   Propofol and fentanyl per ED team  Indication:  Massive UGI bleed  Findings:   Esophagus: Grade 3 esophageal varices with actively bleeding varix at EGJ   Stomach:  Fundus and body full of blood and food Antrum reasonably well seen - normal   Duodenum:  Somewhat obscured by blood from above, but no obvious abnormalities   4 bans successfully placed on esophageal varices, including the culprit lesion that had a mucosal rent with active bleeding  Impression:  Esophageal variceal bleed  Recommendations:  ICU for further supportive care Continue octreotide. If IV access becomes a problem, protonix drip can be converted to 40 mg IV BID No OGT  Charlie Pitter III [facility] Pager [phone]

## 2016-09-21 NOTE — H&P (Signed)
PCCM History and Physical Note  Admission date: 09/21/2016 Referring provider: Dr. Lowella Bandy, ER  CC: Vomiting blood  HPI: Asked by Dr. Lowella Bandy to assess vomiting blood and respiratory failure.  Hx from chart.  51 yo male with hx of ETOH with cirrhosis with ascites and varices presented with vomiting blood.  He required intubation for airway protection.  He was hypotensive in ER.  He is getting PRBC and FFP.  He received IV fluid.  GI has seen and planning EGD.  He is scheduled to get rocephin for prophylaxis.  He had elevated lactic acid.  He was started on octreotide and protonix gtt.  He  has a past medical history of Cirrhosis (HCC) and Hypertension.  He continues to smoke cigarettes and drink alcohol.   Unable to obtain past surgical history, family history, social history.  Allergies  Allergen Reactions  . Sulfa Antibiotics     No current facility-administered medications on file prior to encounter.    Current Outpatient Prescriptions on File Prior to Encounter  Medication Sig  . busPIRone (BUSPAR) 15 MG tablet Take 15 mg by mouth 3 (three) times daily.  . chlordiazePOXIDE (LIBRIUM) 25 MG capsule  PO TID x 1D, then 25-50mg  PO BID X 1D, then 25-50mg  PO QD X 1D  . clonazePAM (KLONOPIN) 1 MG tablet Take 1 mg by mouth 2 (two) times daily as needed for anxiety.  . furosemide (LASIX) 40 MG tablet Take 40 mg by mouth daily.  Marland Kitchen gabapentin (NEURONTIN) 300 MG capsule Take 300 mg by mouth 3 (three) times daily.  Marland Kitchen lactulose (CHRONULAC) 10 GM/15ML solution Take 10 g by mouth 2 (two) times daily as needed for mild constipation.  . Melatonin 3 MG TABS Take 1 tablet by mouth at bedtime.  . pantoprazole (PROTONIX) 40 MG tablet Take 40 mg by mouth daily.  . propranolol (INDERAL) 10 MG tablet Take 10 mg by mouth 3 (three) times daily.  Marland Kitchen spironolactone (ALDACTONE) 100 MG tablet Take 100 mg by mouth daily.  Marland Kitchen venlafaxine XR (EFFEXOR-XR) 150 MG 24 hr capsule Take 150 mg by mouth daily  with breakfast.    ROS: Unable to obtain.  Vital signs: BP 120/88   Pulse (!) 142   Temp 98.9 F (37.2 C) (Rectal)   Resp (!) 33   Ht 6' (1.829 m)   Wt 185 lb (83.9 kg)   SpO2 97%   BMI 25.09 kg/m   Intake/output: No intake/output data recorded.  General: seen in ER Neuro: sedated, paralyzed after intubation HEENT: ETT in place, pupils reactive Cardiac: regular, tachycardic Chest: no wheeze Abd: distended, increased tympany Ext: no edema Skin: no rashes   CMP Latest Ref Rng & Units 09/21/2016 09/21/2016 09/16/2016  Glucose 65 - 99 mg/dL 045(W) 098(J) 191(Y)  BUN 6 - 20 mg/dL 78(G) 95(A) 10  Creatinine 0.61 - 1.24 mg/dL 2.13 0.86 5.78(I)  Sodium 135 - 145 mmol/L 133(L) 130(L) 135  Potassium 3.5 - 5.1 mmol/L 4.7 4.7 3.5  Chloride 101 - 111 mmol/L 99(L) 99(L) 100(L)  CO2 22 - 32 mmol/L - 21(L) 25  Calcium 8.9 - 10.3 mg/dL - 7.3(L) 8.6(L)  Total Protein 6.5 - 8.1 g/dL - 5.2(L) 8.2(H)  Total Bilirubin 0.3 - 1.2 mg/dL - 3.4(H) 6.4(H)  Alkaline Phos 38 - 126 U/L - 64 96  AST 15 - 41 U/L - 62(H) 155(H)  ALT 17 - 63 U/L - 30 61     CBC Latest Ref Rng & Units 09/21/2016 09/21/2016 09/16/2016  WBC  4.0 - 10.5 K/uL - 12.0(H) 7.2  Hemoglobin 13.0 - 17.0 g/dL 7.5(L) 6.0(LL) 11.5(L)  Hematocrit 39.0 - 52.0 % 22.0(L) 17.3(L) 32.2(L)  Platelets 150 - 400 K/uL - 115(L) 74(L)     ABG    Component Value Date/Time   HCO3 21.9 09/21/2016 0813   TCO2 22 09/21/2016 0813   TCO2 23 09/21/2016 0813   ACIDBASEDEF 1.0 09/21/2016 0813   O2SAT 71.0 09/21/2016 0813     CBG (last 3)  No results for input(s): GLUCAP in the last 72 hours.   Imaging: Dg Chest Port 1 View  Result Date: 09/21/2016 CLINICAL DATA:  Abdominal pain EXAM: PORTABLE CHEST 1 VIEW COMPARISON:  None. FINDINGS: Cardiac shadow is within normal limits. Poor inspiratory effort is seen with mild right basilar atelectasis. No bony abnormality is noted. IMPRESSION: Poor inspiratory effort with mild right basilar  atelectasis. Electronically Signed   By: Alcide Clever M.D.   On: 09/21/2016 08:25     Studies:  Antibiotics: Rocephin 4/18 >>  Cultures:  Lines/tubes: ETT 4/18 >>  Events: 4/18 Admit, GI consulted  Summary: 51 yo male with upper GI bleed and VDRF due to compromised airway.  He has hx of ETOH with cirrhosis, ascites and varices.  Assessment/plan:  Upper GI bleed. - GI planning on endoscopy - continue protonix, octreotide gtt - rocephin for prophylaxis  Hemorrhagic shock with lactic acidosis. - continue volume resuscitation - f/u Hb, INR - f/u lactic acid  Acute respiratory failure with compromised airway. Tobacco abuse. - full vent support - f/u CXR, ABG - prn BDs  Acute metabolic encephalopathy. Hx of ETOH. - RASS -1 to -2 - thiamine, folic acid  DVT prophylaxis - SCDs SUP - protonix Nutrition - NPO Goals of care - full code  CC time 41 minutes  Coralyn Helling, MD [facility] 09/21/2016, 9:51 AM Pager:  [phone] After 3pm call: [phone]

## 2016-09-21 NOTE — ED Notes (Signed)
PRBC's transfused-  Unit PRBCs started -[id number] --  Unit FFP started [id number]

## 2016-09-21 NOTE — ED Notes (Signed)
FFP finished, PRBCs finished transfusing.

## 2016-09-21 NOTE — ED Notes (Signed)
5th unit PRBCs up at 999cc/hr

## 2016-09-21 NOTE — ED Notes (Signed)
Pt vomited small amount thick dark brown emesis.

## 2016-09-21 NOTE — ED Notes (Signed)
FFP finished transfusing. Endo remains at bedside.

## 2016-09-21 NOTE — ED Notes (Addendum)
PRBCs stopped.  Unit PRBCs started -- [id number] running at 999cc/hr

## 2016-09-22 ENCOUNTER — Inpatient Hospital Stay (HOSPITAL_COMMUNITY): Payer: PRIVATE HEALTH INSURANCE

## 2016-09-22 DIAGNOSIS — R6521 Severe sepsis with septic shock: Secondary | ICD-10-CM

## 2016-09-22 DIAGNOSIS — A419 Sepsis, unspecified organism: Secondary | ICD-10-CM

## 2016-09-22 DIAGNOSIS — J690 Pneumonitis due to inhalation of food and vomit: Secondary | ICD-10-CM

## 2016-09-22 DIAGNOSIS — J9601 Acute respiratory failure with hypoxia: Secondary | ICD-10-CM

## 2016-09-22 LAB — CBC
HCT: 21.9 % — ABNORMAL LOW (ref 39.0–52.0)
HEMATOCRIT: 22.8 % — AB (ref 39.0–52.0)
HEMATOCRIT: 23.1 % — AB (ref 39.0–52.0)
HEMOGLOBIN: 7.8 g/dL — AB (ref 13.0–17.0)
Hemoglobin: 7.7 g/dL — ABNORMAL LOW (ref 13.0–17.0)
Hemoglobin: 7.4 g/dL — ABNORMAL LOW (ref 13.0–17.0)
MCH: 30.8 pg (ref 26.0–34.0)
MCH: 31.1 pg (ref 26.0–34.0)
MCH: 31.4 pg (ref 26.0–34.0)
MCHC: 33.8 g/dL (ref 30.0–36.0)
MCHC: 33.8 g/dL (ref 30.0–36.0)
MCHC: 33.8 g/dL (ref 30.0–36.0)
MCV: 91.2 fL (ref 78.0–100.0)
MCV: 92 fL (ref 78.0–100.0)
MCV: 92.8 fL (ref 78.0–100.0)
PLATELETS: 89 10*3/uL — AB (ref 150–400)
Platelets: 83 10*3/uL — ABNORMAL LOW (ref 150–400)
Platelets: 102 10*3/uL — ABNORMAL LOW (ref 150–400)
RBC: 2.5 MIL/uL — ABNORMAL LOW (ref 4.22–5.81)
RBC: 2.51 MIL/uL — AB (ref 4.22–5.81)
RBC: 2.36 MIL/uL — AB (ref 4.22–5.81)
RDW: 18.4 % — AB (ref 11.5–15.5)
RDW: 18.8 % — ABNORMAL HIGH (ref 11.5–15.5)
RDW: 18.8 % — ABNORMAL HIGH (ref 11.5–15.5)
WBC: 13.8 10*3/uL — ABNORMAL HIGH (ref 4.0–10.5)
WBC: 14.3 10*3/uL — ABNORMAL HIGH (ref 4.0–10.5)
WBC: 12 10*3/uL — ABNORMAL HIGH (ref 4.0–10.5)

## 2016-09-22 LAB — GLUCOSE, CAPILLARY
GLUCOSE-CAPILLARY: 137 mg/dL — AB (ref 65–99)
GLUCOSE-CAPILLARY: 141 mg/dL — AB (ref 65–99)
GLUCOSE-CAPILLARY: 144 mg/dL — AB (ref 65–99)
Glucose-Capillary: 119 mg/dL — ABNORMAL HIGH (ref 65–99)
Glucose-Capillary: 121 mg/dL — ABNORMAL HIGH (ref 65–99)
Glucose-Capillary: 125 mg/dL — ABNORMAL HIGH (ref 65–99)
Glucose-Capillary: 146 mg/dL — ABNORMAL HIGH (ref 65–99)

## 2016-09-22 LAB — PREPARE FRESH FROZEN PLASMA
UNIT DIVISION: 0
UNIT DIVISION: 0
Unit division: 0
Unit division: 0
Unit division: 0
Unit division: 0

## 2016-09-22 LAB — BPAM FFP
BLOOD PRODUCT EXPIRATION DATE: 201804232359
BLOOD PRODUCT EXPIRATION DATE: 201804232359
Blood Product Expiration Date: 201804232359
Blood Product Expiration Date: 201804232359
Blood Product Expiration Date: 201804232359
Blood Product Expiration Date: 201804232359
ISSUE DATE / TIME: 201804180924
ISSUE DATE / TIME: 201804181049
ISSUE DATE / TIME: 201804181533
ISSUE DATE / TIME: 201804181738
ISSUE DATE / TIME: 201804180924
ISSUE DATE / TIME: 201804181049
UNIT TYPE AND RH: 8400
UNIT TYPE AND RH: 8400
UNIT TYPE AND RH: 8400
Unit Type and Rh: 8400
Unit Type and Rh: 8400
Unit Type and Rh: 8400

## 2016-09-22 LAB — BASIC METABOLIC PANEL
ANION GAP: 10 (ref 5–15)
Anion gap: 7 (ref 5–15)
BUN: 44 mg/dL — ABNORMAL HIGH (ref 6–20)
BUN: 45 mg/dL — AB (ref 6–20)
CALCIUM: 6.5 mg/dL — AB (ref 8.9–10.3)
CHLORIDE: 107 mmol/L (ref 101–111)
CO2: 22 mmol/L (ref 22–32)
CO2: 23 mmol/L (ref 22–32)
CREATININE: 1.07 mg/dL (ref 0.61–1.24)
CREATININE: 0.9 mg/dL (ref 0.61–1.24)
Calcium: 6.4 mg/dL — CL (ref 8.9–10.3)
Chloride: 105 mmol/L (ref 101–111)
GFR calc Af Amer: >60 mL/min (ref >60)
GLUCOSE: 136 mg/dL — AB (ref 65–99)
GLUCOSE: 137 mg/dL — AB (ref 65–99)
Potassium: 5.8 mmol/L — ABNORMAL HIGH (ref 3.5–5.1)
Potassium: 5.8 mmol/L — ABNORMAL HIGH (ref 3.5–5.1)
SODIUM: 137 mmol/L (ref 135–145)
Sodium: 137 mmol/L (ref 135–145)

## 2016-09-22 LAB — POCT I-STAT 3, ART BLOOD GAS (G3+)
ACID-BASE DEFICIT: 6 mmol/L — AB (ref 0.0–2.0)
ACID-BASE DEFICIT: 3 mmol/L — AB (ref 0.0–2.0)
BICARBONATE: 19 mmol/L — AB (ref 20.0–28.0)
BICARBONATE: 21.3 mmol/L (ref 20.0–28.0)
O2 SAT: 94 %
O2 Saturation: 95 %
PO2 ART: 79 mmHg — AB (ref 83.0–108.0)
PO2 ART: 73 mmHg — AB (ref 83.0–108.0)
Patient temperature: 98
TCO2: 20 mmol/L (ref 0–100)
TCO2: 22 mmol/L (ref 0–100)
pCO2 arterial: 35.3 mmHg (ref 32.0–48.0)
pCO2 arterial: 34.3 mmHg (ref 32.0–48.0)
pH, Arterial: 7.339 — ABNORMAL LOW (ref 7.350–7.450)
pH, Arterial: 7.402 (ref 7.350–7.450)

## 2016-09-22 LAB — IRON AND TIBC
IRON: 53 ug/dL (ref 45–182)
Saturation Ratios: 31 % (ref 17.9–39.5)
TIBC: 174 ug/dL — AB (ref 250–450)
UIBC: 121 ug/dL

## 2016-09-22 LAB — COMPREHENSIVE METABOLIC PANEL
ALBUMIN: 1.9 g/dL — AB (ref 3.5–5.0)
ALT: 182 U/L — AB (ref 17–63)
AST: 633 U/L — ABNORMAL HIGH (ref 15–41)
Alkaline Phosphatase: 54 U/L (ref 38–126)
Anion gap: 6 (ref 5–15)
BUN: 40 mg/dL — AB (ref 6–20)
CALCIUM: 5.7 mg/dL — AB (ref 8.9–10.3)
CO2: 23 mmol/L (ref 22–32)
CREATININE: 1.01 mg/dL (ref 0.61–1.24)
Chloride: 106 mmol/L (ref 101–111)
GFR calc Af Amer: >60 mL/min (ref >60)
GFR calc non Af Amer: >60 mL/min (ref >60)
GLUCOSE: 131 mg/dL — AB (ref 65–99)
Potassium: 6.7 mmol/L (ref 3.5–5.1)
SODIUM: 135 mmol/L (ref 135–145)
Total Bilirubin: 5.1 mg/dL — ABNORMAL HIGH (ref 0.3–1.2)
Total Protein: 4.5 g/dL — ABNORMAL LOW (ref 6.5–8.1)

## 2016-09-22 LAB — PREPARE RBC (CROSSMATCH)

## 2016-09-22 LAB — VITAMIN B12: Vitamin B-12: 1081 pg/mL — ABNORMAL HIGH (ref 180–914)

## 2016-09-22 LAB — PHOSPHORUS: Phosphorus: 2.5 mg/dL (ref 2.5–4.6)

## 2016-09-22 LAB — PROTIME-INR
INR: 2.23
Prothrombin Time: 25.1 seconds — ABNORMAL HIGH (ref 11.4–15.2)

## 2016-09-22 LAB — RETICULOCYTES
RBC.: 2.76 MIL/uL — ABNORMAL LOW (ref 4.22–5.81)
RETIC CT PCT: 5.2 % — AB (ref 0.4–3.1)
Retic Count, Absolute: 143.5 10*3/uL (ref 19.0–186.0)

## 2016-09-22 LAB — BLOOD PRODUCT ORDER (VERBAL) VERIFICATION

## 2016-09-22 LAB — FERRITIN: FERRITIN: 576 ng/mL — AB (ref 24–336)

## 2016-09-22 LAB — NA AND K (SODIUM & POTASSIUM), RAND UR
POTASSIUM UR: 42 mmol/L
Sodium, Ur: <10 mmol/L

## 2016-09-22 LAB — HEMOGLOBIN AND HEMATOCRIT, BLOOD
HCT: 25 % — ABNORMAL LOW (ref 39.0–52.0)
HEMATOCRIT: 22.4 % — AB (ref 39.0–52.0)
HEMOGLOBIN: 7.5 g/dL — AB (ref 13.0–17.0)
Hemoglobin: 8.3 g/dL — ABNORMAL LOW (ref 13.0–17.0)

## 2016-09-22 LAB — MAGNESIUM: Magnesium: 1.2 mg/dL — ABNORMAL LOW (ref 1.7–2.4)

## 2016-09-22 LAB — POTASSIUM: Potassium: 6.6 mmol/L (ref 3.5–5.1)

## 2016-09-22 LAB — PROCALCITONIN: Procalcitonin: 1.87 ng/mL

## 2016-09-22 LAB — FOLATE: Folate: 10.5 ng/mL (ref >5.9)

## 2016-09-22 LAB — LACTIC ACID, PLASMA: Lactic Acid, Venous: 4.3 mmol/L (ref 0.5–1.9)

## 2016-09-22 MED ORDER — PIPERACILLIN-TAZOBACTAM 3.375 G IVPB
3.3750 g | Freq: Three times a day (TID) | INTRAVENOUS | Status: DC
Start: 2016-09-22 — End: 2016-09-26
  Administered 2016-09-22 – 2016-09-26 (×11): 3.375 g via INTRAVENOUS
  Filled 2016-09-22 (×13): qty 50

## 2016-09-22 MED ORDER — DEXTROSE 50 % IV SOLN
1.0000 | Freq: Once | INTRAVENOUS | Status: AC
  Administered 2016-09-22: 50 mL via INTRAVENOUS
  Filled 2016-09-22: qty 50

## 2016-09-22 MED ORDER — SODIUM CHLORIDE 0.9 % IV SOLN
Freq: Once | INTRAVENOUS | Status: AC
  Administered 2016-09-22: 18:00:00 via INTRAVENOUS

## 2016-09-22 MED ORDER — LACTULOSE ENEMA
300.0000 mL | Freq: Once | ORAL | Status: AC
  Administered 2016-09-22: 300 mL via RECTAL
  Filled 2016-09-22: qty 300

## 2016-09-22 MED ORDER — ALBUMIN HUMAN 25 % IV SOLN
12.5000 g | Freq: Once | INTRAVENOUS | Status: AC
  Administered 2016-09-22: 12.5 g via INTRAVENOUS
  Filled 2016-09-22: qty 50

## 2016-09-22 MED ORDER — SODIUM CHLORIDE 0.9 % IV BOLUS (SEPSIS)
500.0000 mL | Freq: Once | INTRAVENOUS | Status: AC
  Administered 2016-09-22: 500 mL via INTRAVENOUS

## 2016-09-22 MED ORDER — INSULIN ASPART 100 UNIT/ML IV SOLN
10.0000 [IU] | Freq: Once | INTRAVENOUS | Status: AC
  Administered 2016-09-22: 10 [IU] via INTRAVENOUS

## 2016-09-22 MED ORDER — FUROSEMIDE 10 MG/ML IJ SOLN
40.0000 mg | Freq: Once | INTRAMUSCULAR | Status: AC
  Administered 2016-09-22: 40 mg via INTRAVENOUS
  Filled 2016-09-22: qty 4

## 2016-09-22 MED ORDER — SODIUM CHLORIDE 0.9 % IV SOLN
0.4000 ug/kg/h | INTRAVENOUS | Status: DC
  Administered 2016-09-22 (×2): 0.6 ug/kg/h via INTRAVENOUS
  Administered 2016-09-22: 0.5 ug/kg/h via INTRAVENOUS
  Administered 2016-09-23 (×5): 0.6 ug/kg/h via INTRAVENOUS
  Administered 2016-09-23: 0.4 ug/kg/h via INTRAVENOUS
  Filled 2016-09-22 (×10): qty 2

## 2016-09-22 MED ORDER — SODIUM POLYSTYRENE SULFONATE 15 GM/60ML PO SUSP
15.0000 g | Freq: Once | ORAL | Status: AC
  Administered 2016-09-22: 15 g via RECTAL
  Filled 2016-09-22: qty 60

## 2016-09-22 MED ORDER — INSULIN ASPART 100 UNIT/ML ~~LOC~~ SOLN
2.0000 [IU] | SUBCUTANEOUS | Status: DC
  Administered 2016-09-22 – 2016-09-28 (×21): 2 [IU] via SUBCUTANEOUS

## 2016-09-22 MED ORDER — HYDROCORTISONE NA SUCCINATE PF 100 MG IJ SOLR: 100.0000 mg | Freq: Four times a day (QID) | INTRAMUSCULAR | Status: DC

## 2016-09-22 MED ORDER — SODIUM CHLORIDE 0.9 % IV SOLN
1.0000 g | INTRAVENOUS | Status: AC
  Administered 2016-09-22: 1 g via INTRAVENOUS
  Filled 2016-09-22: qty 10

## 2016-09-22 MED ORDER — PIPERACILLIN-TAZOBACTAM 3.375 G IVPB 30 MIN
3.3750 g | Freq: Once | INTRAVENOUS | Status: AC
  Administered 2016-09-22: 3.375 g via INTRAVENOUS
  Filled 2016-09-22: qty 50

## 2016-09-22 NOTE — Progress Notes (Signed)
CRITICAL VALUE ALERT  Critical value received:Calcium 6.4 Date of notification: 09/22/16  Time of notification: 21:15  Critical value read back:Yes.    Nurse who received alert:  Nelly Laurence   MD notified (1st page): Dr. Elige Ko  Time of first page:  2130  Responding MD: Dr. Elige Ko  Time MD responded:2130

## 2016-09-22 NOTE — Progress Notes (Signed)
PULMONARY / CRITICAL CARE MEDICINE   Name: Ray Hayden MRN: [id number] DOB: 04-Feb-1966    ADMISSION DATE:  09/21/2016 CONSULTATION DATE:  09/21/2016  REFERRING MD:  Dr. Lowella Bandy, EDP  CHIEF COMPLAINT:  Upper GI bleed   Brief:   51 yo male with hx of ETOH with cirrhosis with ascites and varices presented with vomiting blood. Intubated for airway protection. GI endoscopy revealed grade 3 varices in the lower third of the esophagus. Four bands were placed and bleeding stopped. Patient remains hypotensive with elevated lactic acid in the ICU.    SUBJECTIVE:  On levophed, fentanyl, and propofol gtt.   VITAL SIGNS: BP (!) 93/57   Pulse (!) 121   Temp 99.7 F (37.6 C)   Resp (!) 22   Ht 6' (1.829 m)   Wt 106.2 kg (234 lb 2.1 oz)   SpO2 92%   BMI 31.75 kg/m   HEMODYNAMICS: CVP:  [8 mmHg-13 mmHg] 8 mmHg  VENTILATOR SETTINGS: Vent Mode: PRVC FiO2 (%):  [40 %] 40 % Set Rate:  [18 bmp-22 bmp] 22 bmp Vt Set:  [620 mL] 620 mL PEEP:  [5 cmH20] 5 cmH20 Plateau Pressure:  [18 cmH20-25 cmH20] 18 cmH20  INTAKE / OUTPUT: I/O last 3 completed shifts: In: 13593.1 [I.V.:10763.1; Blood:2180; IV Piggyback:650] Out: 2300 [Urine:900; Other:1400]  PHYSICAL EXAMINATION: General:  Adult male, no distress  Neuro:  Opens eyes to verbal stimuli, moves upper extremities, withdrawals from pain  HEENT:  ETT in place  !Cardiovascular:  Tachy, regular, NI S1/S2 Lungs:  Diminished at bases, no crackles/wheeze Abdomen:  Distended, hypoactive bowel sounds  Musculoskeletal:  No acute  Skin:  Warm, dry, intact   LABS:  BMET  Recent Labs Lab 09/16/16 1520 09/21/16 0800 09/21/16 0813  NA 135 130* 133*  K 3.5 4.7 4.7  CL 100* 99* 99*  CO2 25 21*  --   BUN 10 21* 24*  CREATININE 0.55* 1.00 1.00  GLUCOSE 144* 133* 126*    Electrolytes  Recent Labs Lab 09/16/16 1520 09/21/16 0800  CALCIUM 8.6* 7.3*    CBC  Recent Labs Lab 09/16/16 1520 09/21/16 0800  09/21/16 1558 09/21/16 2319  09/22/16 0742  WBC 7.2 12.0*  --   --   --  13.8*  HGB 11.5* 7.2*  6.0*  < > 9.1* 8.5* 7.7*  HCT 32.2* 20.7*  17.3*  < > 27.5* 24.3* 22.8*  PLT 74* 115*  --   --   --  83*  < > = values in this interval not displayed.  Coag's  Recent Labs Lab 09/21/16 0800 09/21/16 1558 09/22/16 0742  APTT 40*  --   --   INR 2.28 2.16 2.23    Sepsis Markers  Recent Labs Lab 09/21/16 0814 09/21/16 1610 09/21/16 2145  LATICACIDVEN 4.35* 7.6* 5.3*    ABG  Recent Labs Lab 09/21/16 0945 09/21/16 1525 09/22/16 0407  PHART 7.235* 7.200* 7.339*  PCO2ART 43.3 37.6 35.3  PO2ART 95.0 103.0 79.0*    Liver Enzymes  Recent Labs Lab 09/16/16 1520 09/21/16 0800  AST 155* 62*  ALT 61 30  ALKPHOS 96 64  BILITOT 6.4* 3.4*  ALBUMIN 3.2* 1.9*    Cardiac Enzymes No results for input(s): TROPONINI, PROBNP in the last 168 hours.  Glucose  Recent Labs Lab 09/21/16 2355 09/22/16 0357 09/22/16 0759  GLUCAP 146* 125* 119*    Imaging Ct Abdomen Pelvis W Contrast  Result Date: 09/21/2016 CLINICAL DATA:  Increasing abdominal distention was noted while placing a  central venous catheter. EXAM: CT ABDOMEN AND PELVIS WITH CONTRAST TECHNIQUE: Multidetector CT imaging of the abdomen and pelvis was performed using the standard protocol following bolus administration of intravenous contrast. CONTRAST:  ISOVUE-300 IOPAMIDOL (ISOVUE-300) INJECTION 61% COMPARISON:  None. FINDINGS: Lower chest: Small bilateral pleural effusions with basilar consolidation. Motion artifact. Hepatobiliary: Nodular liver surface with increased size of the caudate lobe and lateral segment left lobe of the liver suggesting hepatic cirrhosis. No focal liver lesions are identified. Gallbladder is mildly distended. No bile duct dilatation. Pancreas: Unremarkable. No pancreatic ductal dilatation or surrounding inflammatory changes. Spleen: Normal in size without focal abnormality. Adrenals/Urinary Tract: No adrenal gland  nodules. Renal nephrograms are homogeneous and symmetrical. No hydronephrosis or hydroureter. Bladder is decompressed with a Foley catheter. Stomach/Bowel: Stomach is distended with fluid and ingested material. Duodenum and small bowel are decompressed. Can't exclude gastric outlet obstruction versus dysmotility. The bowel wall is diffusely hyperemic. Difficult to exclude wall thickening due to decompression. No pneumatosis. Colon is decompressed with scattered stool present. Suggestion of wall thickening in the transverse, ascending, and descending colon although this is nonspecific due to under distention. Colitis is not excluded. Appendix is not identified. Vascular/Lymphatic: No significant vascular findings are present. No enlarged abdominal or pelvic lymph nodes. Mesenteric artery and vein and portal veins appear patent. Prominent splenic varices are demonstrated in the left upper quadrant. Reproductive: Prostate gland is not enlarged. Other: Diffuse free fluid throughout the abdomen and pelvis likely representing ascites. No free air is demonstrated. Abdominal wall musculature appears intact. Musculoskeletal: No destructive bone lesions. IMPRESSION: 1. Small bilateral pleural effusions with basilar consolidation. 2. Changes of hepatic cirrhosis with varices in the left upper quadrant. Diffuse free fluid throughout the abdomen and pelvis probably represents ascites. 3. Distended stomach may indicate gastric outlet obstruction or gastroparesis. 4. Small bowel are decompressed. Diffuse bowel hyperemia may indicate inflammatory process. 5. Colon is decompressed with suggestion of wall thickening in the ascending, transverse, and descending portions. Findings may indicate colitis. Mesenteric vessels appear patent. No pneumatosis. Electronically Signed   By: Burman Nieves M.D.   On: 09/21/2016 23:01   Dg Chest Port 1 View  Result Date: 09/22/2016 CLINICAL DATA:  Intubated patient, respiratory failure. EXAM:  PORTABLE CHEST 1 VIEW COMPARISON:  Portable chest x-ray of September 21, 2016 FINDINGS: The right lung is mildly hypoinflated. The left lung is better inflated. There is persistent increased density in the retrocardiac region on the left. The cardiac silhouette is normal in size. The central pulmonary vascularity is prominent. The endotracheal tube tip lies 4 cm above the carina. The left internal jugular venous catheter tip projects over the proximal SVC. IMPRESSION: Stable appearance of the chest. Left lower lobe atelectasis or pneumonia. Minimal subsegmental atelectasis at the right lung base. Mild cardiomegaly without significant pulmonary edema. The support tubes are in reasonable position. Electronically Signed   By: David  [place] M.D.   On: 09/22/2016 07:35   Dg Chest Port 1 View  Result Date: 09/21/2016 CLINICAL DATA:  Central line placement EXAM: PORTABLE CHEST 1 VIEW COMPARISON:  09/21/2016 at 1305 hours FINDINGS: Endotracheal tube terminates 4.5 cm above the carina. Low lung volumes. Mild right basilar atelectasis. No definite pleural effusions. No pneumothorax. Left IJ venous catheter terminates at the junction of the left brachiocephalic vein and SVC. IMPRESSION: Left IJ venous catheter terminates at the junction the left brachiocephalic vein and SVC. Endotracheal tube terminates 4.5 cm above the carina. No pneumothorax. Electronically Signed   By: Lurlean Horns  Rito Ehrlich M.D.   On: 09/21/2016 18:07   Dg Chest Port 1 View  Result Date: 09/21/2016 CLINICAL DATA:  Status post intubation EXAM: PORTABLE CHEST 1 VIEW COMPARISON:  Film from earlier in the same day FINDINGS: Cardiac shadow is stable. Mild bibasilar atelectasis is seen. An endotracheal tube is noted in satisfactory position. No bony abnormality is noted. IMPRESSION: Bibasilar atelectasis. Endotracheal tube in satisfactory position. Electronically Signed   By: Alcide Clever M.D.   On: 09/21/2016 13:28   Dg Abd Portable 1v  Result Date:  09/21/2016 CLINICAL DATA:  Abdominal distension EXAM: PORTABLE ABDOMEN - 1 VIEW COMPARISON:  None. FINDINGS: Scattered large and small bowel gas is noted. Mild small bowel distention is seen consistent with at least a partial small bowel obstruction. No free air is seen. No bony abnormality is noted. IMPRESSION: Small bowel dilatation consistent with partial small bowel obstruction. Electronically Signed   By: Alcide Clever M.D.   On: 09/21/2016 13:27     STUDIES:  CXR 4/19 > left lower lobe atelectasis or pneumonia, minimal subsegmental atelectasis at the right lung base, mild cardiomegaly without significant pulmonary edema  CT AP 4/19 > small bilateral pleural effusions with basilar consolidation, changes of hepatic cirrhosis with varices in the left upper quadrant. Diffuse free fluid throughout the abdomen and pelvis probably represents ascites   CULTURES: MRSA by PCR 4/18 > Negative  Sputum 4/19 >>   ANTIBIOTICS: Rocephin 4/18 > 4/19 Zosyn 4/19 >>  SIGNIFICANT EVENTS: 4/18 > Presents to ED > upper GI bleed   LINES/TUBES: ETT 4/18 >> Left IJ CVC 4/18 >>  DISCUSSION: 51 yo male with upper GI bleed and VDRF due to compromised airway.  He has hx of ETOH with cirrhosis, ascites and varices  ASSESSMENT / PLAN:  PULMONARY A: Acute Respiratory Failure with compromised airway secondary to GI bleed  H/O Tobacco abuse  P:   Vent Support  Wean as tolerated  VAP bundle  Trend CXR PRN BD  CARDIOVASCULAR A:  Hemorrhagic Shock H/O HTN  P:  Cardiac Monitoring  Wean Levophed to Maintain >65 Hold home Lasix and spironolactone   RENAL A:   Lactic Acidosis  Hyperkalemia  P:   Trend lactic acid  Trend BMP Replace electrolytes as needed  KVO @ 125 ml/hr  Temporize > repeat K after  D/C Bicarb gtt  Dose of Albumin   GASTROINTESTINAL A:   Upper GI bleed s/p esophageal banding  H/O ascites and cirrhosis  -Ammonia 78 (4/18) P:   GI following  Continue Protonix and  Octreotide gtt  Lactulose Edema Once  Will continue Lactulose when can either place OG or take orals > will trend ammonia   HEMATOLOGIC A:   Elevated INR  Thrombocytopenia  P:  Trend CBC  Hold anticoagulation  Trend INR  INFECTIOUS A:   ?Aspiration Event  Leucocytosis  P:   Change Rocephin to Zosyn  Trend Fever and WBC curve  Send Sputum culture   ENDOCRINE A:   Hyperglycemia    P:   SSI Trend Glucose   NEUROLOGIC A:   Acute Metabolic Encephalopathy  H/O ETOH P:   RASS goal: 0/-1 Monitor  Continue Thiamine and Folic Acid    FAMILY  - Updates: no family at bedside   - Inter-disciplinary family meet or Palliative Care meeting due by: 4/25   CC Time : 42 minutes   [name], AG-ACNP [facility]  Pgr: [phone]  PCCM Pgr: [phone]

## 2016-09-22 NOTE — Care Management Note (Addendum)
Case Management Note  Patient Details  Name: Ray Hayden MRN: [id number] Date of Birth: [birth year]   Subjective/Objective:   Pt is intubated. EGD yesterday with finding of Grade III bleeding esophageal varices. Completed eradicated and banded. Hgb has dropped slightly overnight from 8.5 to 7.8.  [name] brother at the bedside, Ray Hayden cell [phone], he states he will be staying with patient in [place] when patient leaves the hospital.  He states patient is an employee with [employer] and he does have insurance but he is not sure with what company but it use to be [employer].  Brother also states patient does not have any children he has been married twice.  NCM informed him that we will cont to follow along for patient 's progression.                   Action/Plan: NCM will follow for dc needs.  Expected Discharge Date:                  Expected Discharge Plan:     In-House Referral:     Discharge planning Services  CM Consult  Post Acute Care Choice:    Choice offered to:     DME Arranged:    DME Agency:     HH Arranged:    HH Agency:     Status of Service:  In process, will continue to follow  If discussed at Long Length of Stay Meetings, dates discussed:    Additional Comments:  Leone Haven, RN 09/22/2016, 5:23 PM

## 2016-09-22 NOTE — Progress Notes (Addendum)
Progress Note   Subjective  Chief Complaint: GI Bleed  Pt continues to be intubated. EGD yesterday with finding of Grade III bleeding esophageal varices. Completed eradicated and banded. Hgb has dropped slightly overnight from 8.5 to 7.8.    Objective   Vital signs in last 24 hours: Temp:  [96.3 F (35.7 C)-100.4 F (38 C)] 100.4 F (38 C) (04/19 1015) Pulse Rate:  [87-131] 125 (04/19 1015) Resp:  [18-26] 22 (04/19 1015) BP: (64-143)/(38-89) 80/49 (04/19 1015) SpO2:  [91 %-100 %] 92 % (04/19 1015) FiO2 (%):  [40 %] 40 % (04/19 0750) Weight:  [234 lb 2.1 oz (106.2 kg)] 234 lb 2.1 oz (106.2 kg) (04/19 0440) Last BM Date:  (PTA) General:  Caucasian male intubated Heart:  Regular rate and rhythm; no murmurs Lungs: Respirations even and unlabored, lungs CTA bilaterally Abdomen:  Soft,mild distension, hypoactive bowel sounds Extremities:  Without edema. Neurologic: Sedated  Intake/Output from previous day: 04/18 0701 - 04/19 0700 In: 13593.1 [I.V.:10763.1; Blood:2180; IV Piggyback:650] Out: 2300 [Urine:900] Intake/Output this shift: Total I/O In: 758.2 [I.V.:708.2; IV Piggyback:50] Out: 175 [Urine:175]  Lab Results:  Recent Labs  09/21/16 0800  09/21/16 2319 09/22/16 0742 09/22/16 0853  WBC 12.0*  --   --  13.8* 14.3*  HGB 7.2*  6.0*  < > 8.5* 7.7* 7.8*  HCT 20.7*  17.3*  < > 24.3* 22.8* 23.1*  PLT 115*  --   --  83* 102*  < > = values in this interval not displayed. BMET  Recent Labs  09/21/16 0800 09/21/16 0813 09/22/16 0853  NA 130* 133* 135  K 4.7 4.7 6.7*  CL 99* 99* 106  CO2 21*  --  23  GLUCOSE 133* 126* 131*  BUN 21* 24* 40*  CREATININE 1.00 1.00 1.01  CALCIUM 7.3*  --  5.7*   LFT  Recent Labs  09/22/16 0853  PROT 4.5*  ALBUMIN 1.9*  AST 633*  ALT 182*  ALKPHOS 54  BILITOT 5.1*   PT/INR  Recent Labs  09/21/16 1558 09/22/16 0742  LABPROT 24.5* 25.1*  INR 2.16 2.23    Studies/Results: Ct Abdomen Pelvis W  Contrast  Result Date: 09/21/2016 CLINICAL DATA:  Increasing abdominal distention was noted while placing a central venous catheter. EXAM: CT ABDOMEN AND PELVIS WITH CONTRAST TECHNIQUE: Multidetector CT imaging of the abdomen and pelvis was performed using the standard protocol following bolus administration of intravenous contrast. CONTRAST:  ISOVUE-300 IOPAMIDOL (ISOVUE-300) INJECTION 61% COMPARISON:  None. FINDINGS: Lower chest: Small bilateral pleural effusions with basilar consolidation. Motion artifact. Hepatobiliary: Nodular liver surface with increased size of the caudate lobe and lateral segment left lobe of the liver suggesting hepatic cirrhosis. No focal liver lesions are identified. Gallbladder is mildly distended. No bile duct dilatation. Pancreas: Unremarkable. No pancreatic ductal dilatation or surrounding inflammatory changes. Spleen: Normal in size without focal abnormality. Adrenals/Urinary Tract: No adrenal gland nodules. Renal nephrograms are homogeneous and symmetrical. No hydronephrosis or hydroureter. Bladder is decompressed with a Foley catheter. Stomach/Bowel: Stomach is distended with fluid and ingested material. Duodenum and small bowel are decompressed. Can't exclude gastric outlet obstruction versus dysmotility. The bowel wall is diffusely hyperemic. Difficult to exclude wall thickening due to decompression. No pneumatosis. Colon is decompressed with scattered stool present. Suggestion of wall thickening in the transverse, ascending, and descending colon although this is nonspecific due to under distention. Colitis is not excluded. Appendix is not identified. Vascular/Lymphatic: No significant vascular findings are present. No enlarged abdominal or  pelvic lymph nodes. Mesenteric artery and vein and portal veins appear patent. Prominent splenic varices are demonstrated in the left upper quadrant. Reproductive: Prostate gland is not enlarged. Other: Diffuse free fluid throughout  the abdomen and pelvis likely representing ascites. No free air is demonstrated. Abdominal wall musculature appears intact. Musculoskeletal: No destructive bone lesions. IMPRESSION: 1. Small bilateral pleural effusions with basilar consolidation. 2. Changes of hepatic cirrhosis with varices in the left upper quadrant. Diffuse free fluid throughout the abdomen and pelvis probably represents ascites. 3. Distended stomach may indicate gastric outlet obstruction or gastroparesis. 4. Small bowel are decompressed. Diffuse bowel hyperemia may indicate inflammatory process. 5. Colon is decompressed with suggestion of wall thickening in the ascending, transverse, and descending portions. Findings may indicate colitis. Mesenteric vessels appear patent. No pneumatosis. Electronically Signed   By: Burman Nieves M.D.   On: 09/21/2016 23:01   Dg Chest Port 1 View  Result Date: 09/22/2016 CLINICAL DATA:  Intubated patient, respiratory failure. EXAM: PORTABLE CHEST 1 VIEW COMPARISON:  Portable chest x-ray of September 21, 2016 FINDINGS: The right lung is mildly hypoinflated. The left lung is better inflated. There is persistent increased density in the retrocardiac region on the left. The cardiac silhouette is normal in size. The central pulmonary vascularity is prominent. The endotracheal tube tip lies 4 cm above the carina. The left internal jugular venous catheter tip projects over the proximal SVC. IMPRESSION: Stable appearance of the chest. Left lower lobe atelectasis or pneumonia. Minimal subsegmental atelectasis at the right lung base. Mild cardiomegaly without significant pulmonary edema. The support tubes are in reasonable position. Electronically Signed   By: David  [place] M.D.   On: 09/22/2016 07:35   Dg Chest Port 1 View  Result Date: 09/21/2016 CLINICAL DATA:  Central line placement EXAM: PORTABLE CHEST 1 VIEW COMPARISON:  09/21/2016 at 1305 hours FINDINGS: Endotracheal tube terminates 4.5 cm above the carina.  Low lung volumes. Mild right basilar atelectasis. No definite pleural effusions. No pneumothorax. Left IJ venous catheter terminates at the junction of the left brachiocephalic vein and SVC. IMPRESSION: Left IJ venous catheter terminates at the junction the left brachiocephalic vein and SVC. Endotracheal tube terminates 4.5 cm above the carina. No pneumothorax. Electronically Signed   By: Charline Bills M.D.   On: 09/21/2016 18:07   Dg Chest Port 1 View  Result Date: 09/21/2016 CLINICAL DATA:  Status post intubation EXAM: PORTABLE CHEST 1 VIEW COMPARISON:  Film from earlier in the same day FINDINGS: Cardiac shadow is stable. Mild bibasilar atelectasis is seen. An endotracheal tube is noted in satisfactory position. No bony abnormality is noted. IMPRESSION: Bibasilar atelectasis. Endotracheal tube in satisfactory position. Electronically Signed   By: Alcide Clever M.D.   On: 09/21/2016 13:28   Dg Chest Port 1 View  Result Date: 09/21/2016 CLINICAL DATA:  Abdominal pain EXAM: PORTABLE CHEST 1 VIEW COMPARISON:  None. FINDINGS: Cardiac shadow is within normal limits. Poor inspiratory effort is seen with mild right basilar atelectasis. No bony abnormality is noted. IMPRESSION: Poor inspiratory effort with mild right basilar atelectasis. Electronically Signed   By: Alcide Clever M.D.   On: 09/21/2016 08:25   Dg Abd Portable 1v  Result Date: 09/21/2016 CLINICAL DATA:  Abdominal distension EXAM: PORTABLE ABDOMEN - 1 VIEW COMPARISON:  None. FINDINGS: Scattered large and small bowel gas is noted. Mild small bowel distention is seen consistent with at least a partial small bowel obstruction. No free air is seen. No bony abnormality is noted. IMPRESSION: Small bowel  dilatation consistent with partial small bowel obstruction. Electronically Signed   By: Alcide Clever M.D.   On: 09/21/2016 13:27   EGD 09/21/16 Findings:      Three columns of oozing grade III varices were found in the lower third       of the  esophagus,. Red wale signs were present. Four bands were       successfully placed with complete eradication, resulting in deflation of       varices. Bleeding had stopped at the end of the procedure.      Red blood was found in the entire examined stomach.      The examined duodenum was normal. Impression:               - Bleeding grade III esophageal varices. Completely                            eradicated. Banded.                           - Red blood in the entire stomach.                           - Normal examined duodenum.                           - No specimens collected. Moderate Sedation:      see above Recommendation:           - NPO.                           - Continue present medications.                           - Continue supportive care in ICU with blood                            products, serial Hgb/Hct, Antibiotics, octreotide                            and pantoprazole.   Assessment / Plan:   Assessment: 1. Acute Variceal bleed in setting of ETOH Cirrhosis: pt s/p banding of Grade III bleeding esophageal varices on 09/21/16, hgb at 7.8 this morning, patient remains intubated 2. Hemorrhagic shock s/p variceal bleed 3. Anemia of acute GI blood loss 4. Portal hypertensive ascites  Plan: 1. Per critical care team, sedation was stopped this morning, plans for extubation when able, giving Lactulose rectally, questions regarding timing of possible OG tube? 2. Continue other supportive measures  3. Please await any further recs from Dr. Myrtie Neither later today  Thank you for your kind consultation, we will continue to follow   LOS: 1 day   Unk Lightning  09/22/2016, 11:03 AM  Pager # [phone]  I have discussed the case with the PA, and that is the plan I formulated. I personally interviewed and examined the patient.  Hematemesis Bleeding esophageal varices Alcohol related cirrhosis Anemia of acute GI blood loss  He continues to be hypotensive,  tachycardic and agitated. However, his hemoglobin remains stable from yesterday without further transfusion. I think his bleeding has stopped, hopefully for  good.  Fortunately, his renal function remains stable and his coagulopathy stable as well.  He seems to have a larger volume of ascites today compared to yesterday, most likely due to the amount of volume he has received. He seems likely to require therapeutic paracentesis in the coming days, however he is too unstable to leave the unit for that at this point. If it becomes difficult to ventilate him due to the ascites, it may need to be done in. Hopefully he can wake up and extubate relatively soon.  Continue current management including continuous octreotide infusion. I would like to continue the Protonix infusion another 24 hours, however if IV access is limited, this medicine can be changed to 40 mg IV twice daily. I conveyed that to his nurse.  Please do not place an OG tube on this patient. I also conveyed that to the patient's nurse.  We will follow  Charlie Pitter III Pager [phone]  Mon-Fri 8a-5p [phone] after 5p, weekends, holidays

## 2016-09-22 NOTE — Progress Notes (Signed)
PULMONARY / CRITICAL CARE MEDICINE   Name: Clem Wisenbaker MRN: [id number] DOB: 1965-10-25    ADMISSION DATE:  09/21/2016 CONSULTATION DATE:  09/21/2016  REFERRING MD:  Dr. Lowella Bandy, EDP  CHIEF COMPLAINT:  Upper Gi Bleed   Brief:   51 yo male with hx of ETOH with cirrhosis with ascites and varices presented with vomiting blood. Intubated for airway protection. GI endoscopy revealed grade 3 varices in the lower third of the esophagus. Four bands were placed and bleeding stopped. Patient remains hypotensive with elevated lactic acid in the ICU.    SUBJECTIVE:  Remains on Levophed gtt at 20 mcg and precedex and fentanyl gtt.   VITAL SIGNS: BP 119/68   Pulse (!) 110   Temp 99 F (37.2 C) (Core (Comment))   Resp (!) 22   Ht 6' (1.829 m)   Wt 106.2 kg (234 lb 2.1 oz)   SpO2 93%   BMI 31.75 kg/m   HEMODYNAMICS: CVP:  [8 mmHg-13 mmHg] 8 mmHg  VENTILATOR SETTINGS: Vent Mode: PRVC FiO2 (%):  [40 %] 40 % Set Rate:  [22 bmp] 22 bmp Vt Set:  [782 mL] 620 mL PEEP:  [5 cmH20] 5 cmH20 Plateau Pressure:  [18 cmH20-20 cmH20] 20 cmH20  INTAKE / OUTPUT: I/O last 3 completed shifts: In: 13593.1 [I.V.:10763.1; Blood:2180; IV Piggyback:650] Out: 2300 [Urine:900; Other:1400]  PHYSICAL EXAMINATION: General:  Adult male, no distress  Neuro:  Sedated, does not follow commands, pupils intact, withdrawals from pain   HEENT:  ETT in place  Cardiovascular:  RRR, no MRG, NI S1/S2 Lungs:  Crackles to bases, no wheeze, non-labored  Abdomen:  Distended, active bowel sounds  Musculoskeletal:  +3 Edema on bilateral upper and lower extremities  Skin:  Warm, dry, intact   LABS:  BMET  Recent Labs Lab 09/21/16 0800 09/21/16 0813 09/22/16 0853 09/22/16 1035 09/22/16 1206  NA 130* 133* 135  --  137  K 4.7 4.7 6.7* 6.6* 5.8*  CL 99* 99* 106  --  105  CO2 21*  --  23  --  22  BUN 21* 24* 40*  --  44*  CREATININE 1.00 1.00 1.01  --  1.07  GLUCOSE 133* 126* 131*  --  136*     Electrolytes  Recent Labs Lab 09/21/16 0800 09/22/16 0853 09/22/16 1206  CALCIUM 7.3* 5.7* 6.5*  MG  --  1.2*  --   PHOS  --  2.5  --     CBC  Recent Labs Lab 09/22/16 0742 09/22/16 0853 09/22/16 1035 09/22/16 1206  WBC 13.8* 14.3*  --  12.0*  HGB 7.7* 7.8* 7.5* 7.4*  HCT 22.8* 23.1* 22.4* 21.9*  PLT 83* 102*  --  89*    Coag's  Recent Labs Lab 09/21/16 0800 09/21/16 1558 09/22/16 0742  APTT 40*  --   --   INR 2.28 2.16 2.23    Sepsis Markers  Recent Labs Lab 09/21/16 1610 09/21/16 2145 09/22/16 1035 09/22/16 1206  LATICACIDVEN 7.6* 5.3* 4.3*  --   PROCALCITON  --   --   --  1.87    ABG  Recent Labs Lab 09/21/16 1525 09/22/16 0407 09/22/16 1500  PHART 7.200* 7.339* 7.402  PCO2ART 37.6 35.3 34.3  PO2ART 103.0 79.0* 73.0*    Liver Enzymes  Recent Labs Lab 09/16/16 1520 09/21/16 0800 09/22/16 0853  AST 155* 62* 633*  ALT 61 30 182*  ALKPHOS 96 64 54  BILITOT 6.4* 3.4* 5.1*  ALBUMIN 3.2* 1.9* 1.9*  Cardiac Enzymes No results for input(s): TROPONINI, PROBNP in the last 168 hours.  Glucose  Recent Labs Lab 09/21/16 2355 09/22/16 0357 09/22/16 0759 09/22/16 1223 09/22/16 1545  GLUCAP 146* 125* 119* 141* 121*    Imaging Ct Abdomen Pelvis W Contrast  Result Date: 09/21/2016 CLINICAL DATA:  Increasing abdominal distention was noted while placing a central venous catheter. EXAM: CT ABDOMEN AND PELVIS WITH CONTRAST TECHNIQUE: Multidetector CT imaging of the abdomen and pelvis was performed using the standard protocol following bolus administration of intravenous contrast. CONTRAST:  ISOVUE-300 IOPAMIDOL (ISOVUE-300) INJECTION 61% COMPARISON:  None. FINDINGS: Lower chest: Small bilateral pleural effusions with basilar consolidation. Motion artifact. Hepatobiliary: Nodular liver surface with increased size of the caudate lobe and lateral segment left lobe of the liver suggesting hepatic cirrhosis. No focal liver lesions are  identified. Gallbladder is mildly distended. No bile duct dilatation. Pancreas: Unremarkable. No pancreatic ductal dilatation or surrounding inflammatory changes. Spleen: Normal in size without focal abnormality. Adrenals/Urinary Tract: No adrenal gland nodules. Renal nephrograms are homogeneous and symmetrical. No hydronephrosis or hydroureter. Bladder is decompressed with a Foley catheter. Stomach/Bowel: Stomach is distended with fluid and ingested material. Duodenum and small bowel are decompressed. Can't exclude gastric outlet obstruction versus dysmotility. The bowel wall is diffusely hyperemic. Difficult to exclude wall thickening due to decompression. No pneumatosis. Colon is decompressed with scattered stool present. Suggestion of wall thickening in the transverse, ascending, and descending colon although this is nonspecific due to under distention. Colitis is not excluded. Appendix is not identified. Vascular/Lymphatic: No significant vascular findings are present. No enlarged abdominal or pelvic lymph nodes. Mesenteric artery and vein and portal veins appear patent. Prominent splenic varices are demonstrated in the left upper quadrant. Reproductive: Prostate gland is not enlarged. Other: Diffuse free fluid throughout the abdomen and pelvis likely representing ascites. No free air is demonstrated. Abdominal wall musculature appears intact. Musculoskeletal: No destructive bone lesions. IMPRESSION: 1. Small bilateral pleural effusions with basilar consolidation. 2. Changes of hepatic cirrhosis with varices in the left upper quadrant. Diffuse free fluid throughout the abdomen and pelvis probably represents ascites. 3. Distended stomach may indicate gastric outlet obstruction or gastroparesis. 4. Small bowel are decompressed. Diffuse bowel hyperemia may indicate inflammatory process. 5. Colon is decompressed with suggestion of wall thickening in the ascending, transverse, and descending portions. Findings may  indicate colitis. Mesenteric vessels appear patent. No pneumatosis. Electronically Signed   By: Burman Nieves M.D.   On: 09/21/2016 23:01   Dg Chest Port 1 View  Result Date: 09/22/2016 CLINICAL DATA:  Intubated patient, respiratory failure. EXAM: PORTABLE CHEST 1 VIEW COMPARISON:  Portable chest x-ray of September 21, 2016 FINDINGS: The right lung is mildly hypoinflated. The left lung is better inflated. There is persistent increased density in the retrocardiac region on the left. The cardiac silhouette is normal in size. The central pulmonary vascularity is prominent. The endotracheal tube tip lies 4 cm above the carina. The left internal jugular venous catheter tip projects over the proximal SVC. IMPRESSION: Stable appearance of the chest. Left lower lobe atelectasis or pneumonia. Minimal subsegmental atelectasis at the right lung base. Mild cardiomegaly without significant pulmonary edema. The support tubes are in reasonable position. Electronically Signed   By: David  [place] M.D.   On: 09/22/2016 07:35   Dg Chest Port 1 View  Result Date: 09/21/2016 CLINICAL DATA:  Central line placement EXAM: PORTABLE CHEST 1 VIEW COMPARISON:  09/21/2016 at 1305 hours FINDINGS: Endotracheal tube terminates 4.5 cm above the carina.  Low lung volumes. Mild right basilar atelectasis. No definite pleural effusions. No pneumothorax. Left IJ venous catheter terminates at the junction of the left brachiocephalic vein and SVC. IMPRESSION: Left IJ venous catheter terminates at the junction the left brachiocephalic vein and SVC. Endotracheal tube terminates 4.5 cm above the carina. No pneumothorax. Electronically Signed   By: Charline Bills M.D.   On: 09/21/2016 18:07     STUDIES:  CXR 4/19 > left lower lobe atelectasis or pneumonia, minimal subsegmental atelectasis at the right lung base, mild cardiomegaly without significant pulmonary edema  CT AP 4/19 > small bilateral pleural effusions with basilar consolidation,  changes of hepatic cirrhosis with varices in the left upper quadrant. Diffuse free fluid throughout the abdomen and pelvis probably represents ascites   CULTURES: MRSA by PCR 4/18 > Negative  Sputum 4/19 >>   ANTIBIOTICS: Rocephin 4/18 > 4/19 Zosyn 4/19 >>  SIGNIFICANT EVENTS: 4/18 > Presents to ED > upper GI bleed > EGD s/p banding   LINES/TUBES: ETT 4/18 >> Left IJ CVC 4/18 >>  DISCUSSION: 51 yo male with upper GI bleed and VDRF due to compromised airway. He has hx of ETOH with cirrhosis, ascites and varices  ASSESSMENT / PLAN:  PULMONARY A: Acute Respiratory failure with compromised airway in setting of GI bleed  H/O Tobacco abuse  P:   Vent support  Wean as tolerated > wake up assessment 4/19 however blood clots through ETT suctioning (has decreased since) > will attempt wean today  VAP bundle  Trend CXR BD PRN  CARDIOVASCULAR A:  Hemorrhagic Shock - improving Volume Overload  +15.2L  H/O HTN P:  Cardiac Monitoring  Wean levophed to maintain MAP >65 Trend CVP Hold home lasix and spironolactone   RENAL A:   Lactic Acidosis  4.3>7.6>5.3>4.3 Hyperkalemia - improving  Hypocalcium  P:   Trend Lactic Acid Trend BMP Replace electrolytes as needed  NS @ 75 ml/hr 1 g calcium gluconate this AM   GASTROINTESTINAL A:   Esophageal bleeding s/p banding  Ascites  H/O ascites and cirrhosis  -Ammonia 78 (4/18) P:   GI following  Continue protonix and octreotide gtt  Bedside Para? > Will preform U/S  Trend ammonia > 4/20 (97) > will give lactulose edema  Will continue lactulose po once   HEMATOLOGIC A:   Elevated INR 2.28 > 2.23 Anemia  8.5 > 7.7 >7.8>7.5>7.4 Thrombocytopenia  P:  Trend CBC Hold anticoagulation  Trend INR   INFECTIOUS A:   ?Aspiration Event  Leucocytosis  -Procal 1.87 > 2.13 P:   Continue Zosyn Trend Fever and WBC curve Follow culture data   ENDOCRINE A:   Hyperglycemia    P:   SSI Trend Glucose  Started on  Stress Dose steroids (solu-cortef 100 mg q6h) 4/19   NEUROLOGIC A:   Acute Metabolic Encephalopathy  H/O ETOH  P:   RASS goal: 0/-1 Precedex gtt  Wean Fentanyl gtt to achieve RASS   Continue Thiamine and Folic Acid    FAMILY  - Updates: brother updated at bedside 4/19, requesting after patient is stable, case management assistance in helping with rehab/detox placement   - Inter-disciplinary family meet or Palliative Care meeting due by: 4/25  CC Time: 34 minutes   [name], AG-ACNP [facility]  Pgr: [phone]  PCCM Pgr: [phone]

## 2016-09-22 NOTE — Progress Notes (Signed)
Initial Nutrition Assessment  DOCUMENTATION CODES:   Obesity unspecified  INTERVENTION:    If TF started, rec Vital High Protein at goal rate of 40 ml/h (960 ml per day) and Prostat 60 ml TID   TF regimen to provide 1560 kcals, 174 gm protein, 803 ml free water daily  NUTRITION DIAGNOSIS:   Inadequate oral intake related to inability to eat as evidenced by NPO status  GOAL:   Provide needs based on ASPEN/SCCM guidelines  MONITOR:   Vent status, Labs, Weight trends, Skin, I & O's  REASON FOR ASSESSMENT:   Ventilator  ASSESSMENT:   51 y.o. Male with PMH of ETOH cirrhosis.  Ascites, paracentesis 05/2016 x 3 (2.6, ?? , 4.6 liters, no SBP). ETOH hepatitis.  Hep B immune.   Depression.  Blood loss anemia/transfusion 04/2016.  Thrombocytopenia. OSA.  s/p appendectomy.  s/p hernia repair.  Right lung nodule per CT 05/2015.  Negative cardiac stress test in 06/2013.     Pt s/p procedure 4/18: EGD with VARICEAL BANDING  Patient is currently intubated on ventilator support Temp (24hrs), Avg:98.2 F (36.8 C), Min:96.3 F (35.7 C), Max:100.4 F (38 C)  Pt with hx of ETOH, cirrhosis with ascites presented with vomiting blood.  GI note reviewed.  Found to have Grade 3 esophageal varices with actively bleeding varix at EGJ. Medications reviewed and include Levophed, folic acid and thiamine. Labs reviewed.  Potassium 6.7 (HH).  Magnesium 1.2 (L). CBG's [id number].  Nutrition focused physical exam completed.  No muscle or subcutaneous fat depletion noticed.  Diet Order:  Diet NPO time specified  Skin:  Reviewed, no issues  Last BM:  N/A  Height:   Ht Readings from Last 1 Encounters:  09/21/16 6' (1.829 m)   Weight:   Wt Readings from Last 1 Encounters:  09/22/16 234 lb 2.1 oz (106.2 kg)   Ideal Body Weight:  81 kg  BMI:  Body mass index is 31.75 kg/m.  Estimated Nutritional Needs:   Kcal:  9604-5409  Protein:  >/= 160 gm  Fluid:  per MD  EDUCATION NEEDS:    No education needs identified at this time  Maureen Chatters, RD, LDN Pager #: [phone] After-Hours Pager #: [phone]

## 2016-09-22 NOTE — Progress Notes (Signed)
eLink Physician-Brief Progress Note Patient Name: Ray Hayden DOB: [birth year] MRN: [id number]   Date of Service  09/22/2016  HPI/Events of Note  Pt was on aldactone pre-admit and now with hyperkalemia but improving uop despite levophed dep hypotension   Intake/Output Summary (Last 24 hours) at 09/22/16 2140 Last data filed at 09/22/16 2000  Gross per 24 hour  Intake          7528.46 ml  Output             1550 ml  Net          5978.46 ml     eICU Interventions  Lasix/ fluids /hydrocortisone and CaCl2 prn      Intervention Category Major Interventions: Electrolyte abnormality - evaluation and management  Sandrea Hughs 09/22/2016, 9:37 PM

## 2016-09-23 ENCOUNTER — Inpatient Hospital Stay (HOSPITAL_COMMUNITY): Payer: PRIVATE HEALTH INSURANCE

## 2016-09-23 DIAGNOSIS — R7989 Other specified abnormal findings of blood chemistry: Secondary | ICD-10-CM

## 2016-09-23 DIAGNOSIS — R945 Abnormal results of liver function studies: Secondary | ICD-10-CM

## 2016-09-23 LAB — BASIC METABOLIC PANEL
Anion gap: 5 (ref 5–15)
Anion gap: 7 (ref 5–15)
BUN: 46 mg/dL — ABNORMAL HIGH (ref 6–20)
BUN: 45 mg/dL — AB (ref 6–20)
CALCIUM: 6.5 mg/dL — AB (ref 8.9–10.3)
CO2: 24 mmol/L (ref 22–32)
CO2: 23 mmol/L (ref 22–32)
CREATININE: 0.87 mg/dL (ref 0.61–1.24)
CREATININE: 0.91 mg/dL (ref 0.61–1.24)
Calcium: 6.5 mg/dL — ABNORMAL LOW (ref 8.9–10.3)
Chloride: 108 mmol/L (ref 101–111)
Chloride: 107 mmol/L (ref 101–111)
GFR calc Af Amer: >60 mL/min (ref >60)
GLUCOSE: 131 mg/dL — AB (ref 65–99)
Glucose, Bld: 119 mg/dL — ABNORMAL HIGH (ref 65–99)
POTASSIUM: 4.7 mmol/L (ref 3.5–5.1)
Potassium: 4.6 mmol/L (ref 3.5–5.1)
SODIUM: 137 mmol/L (ref 135–145)
SODIUM: 137 mmol/L (ref 135–145)

## 2016-09-23 LAB — CBC
HCT: 24.5 % — ABNORMAL LOW (ref 39.0–52.0)
HEMOGLOBIN: 8.2 g/dL — AB (ref 13.0–17.0)
MCH: 30.3 pg (ref 26.0–34.0)
MCHC: 33.5 g/dL (ref 30.0–36.0)
MCV: 90.4 fL (ref 78.0–100.0)
PLATELETS: 90 10*3/uL — AB (ref 150–400)
RBC: 2.71 MIL/uL — AB (ref 4.22–5.81)
RDW: 19.2 % — ABNORMAL HIGH (ref 11.5–15.5)
WBC: 9.8 10*3/uL (ref 4.0–10.5)

## 2016-09-23 LAB — GLUCOSE, CAPILLARY
GLUCOSE-CAPILLARY: 120 mg/dL — AB (ref 65–99)
GLUCOSE-CAPILLARY: 128 mg/dL — AB (ref 65–99)
GLUCOSE-CAPILLARY: 133 mg/dL — AB (ref 65–99)
Glucose-Capillary: 129 mg/dL — ABNORMAL HIGH (ref 65–99)
Glucose-Capillary: 141 mg/dL — ABNORMAL HIGH (ref 65–99)

## 2016-09-23 LAB — PHOSPHORUS: PHOSPHORUS: 2.5 mg/dL (ref 2.5–4.6)

## 2016-09-23 LAB — PROCALCITONIN: Procalcitonin: 2.13 ng/mL

## 2016-09-23 LAB — AMMONIA: Ammonia: 97 umol/L — ABNORMAL HIGH (ref 9–35)

## 2016-09-23 LAB — MAGNESIUM: MAGNESIUM: 1.4 mg/dL — AB (ref 1.7–2.4)

## 2016-09-23 MED ORDER — DEXMEDETOMIDINE HCL IN NACL 400 MCG/100ML IV SOLN
0.4000 ug/kg/h | INTRAVENOUS | Status: DC
  Administered 2016-09-24 (×2): 0.6 ug/kg/h via INTRAVENOUS
  Administered 2016-09-24: 0.7 ug/kg/h via INTRAVENOUS
  Administered 2016-09-24: 0.6 ug/kg/h via INTRAVENOUS
  Administered 2016-09-25: 0.9 ug/kg/h via INTRAVENOUS
  Administered 2016-09-25: 1.1 ug/kg/h via INTRAVENOUS
  Administered 2016-09-25 (×2): 1 ug/kg/h via INTRAVENOUS
  Administered 2016-09-25: 1.1 ug/kg/h via INTRAVENOUS
  Administered 2016-09-25: 0.978 ug/kg/h via INTRAVENOUS
  Administered 2016-09-25: 1 ug/kg/h via INTRAVENOUS
  Administered 2016-09-26 (×2): 1.173 ug/kg/h via INTRAVENOUS
  Administered 2016-09-26: 0.8 ug/kg/h via INTRAVENOUS
  Administered 2016-09-26: 1.1 ug/kg/h via INTRAVENOUS
  Administered 2016-09-26 (×2): 1.173 ug/kg/h via INTRAVENOUS
  Administered 2016-09-27: 1.2 ug/kg/h via INTRAVENOUS
  Administered 2016-09-27: 0.7 ug/kg/h via INTRAVENOUS
  Administered 2016-09-27: 1.2 ug/kg/h via INTRAVENOUS
  Administered 2016-09-28: 0.5 ug/kg/h via INTRAVENOUS
  Filled 2016-09-23 (×23): qty 100

## 2016-09-23 MED ORDER — LACTULOSE ENEMA
300.0000 mL | Freq: Once | ORAL | Status: AC
  Administered 2016-09-23: 300 mL via RECTAL
  Filled 2016-09-23: qty 300

## 2016-09-23 MED ORDER — SODIUM CHLORIDE 0.9 % IV SOLN
1.0000 g | Freq: Once | INTRAVENOUS | Status: AC
  Administered 2016-09-23: 1 g via INTRAVENOUS
  Filled 2016-09-23: qty 10

## 2016-09-23 MED ORDER — HYDROCORTISONE NA SUCCINATE PF 100 MG IJ SOLR
50.0000 mg | Freq: Two times a day (BID) | INTRAMUSCULAR | Status: DC
  Administered 2016-09-23 (×2): 50 mg via INTRAVENOUS
  Filled 2016-09-23 (×2): qty 2

## 2016-09-23 NOTE — Progress Notes (Signed)
[facility] Progress Note  Chief Complaint: Esophageal variceal bleed  Subjective  History:  This patient has improved somewhat from yesterday. He is less tachycardic, and requiring less pressor support. He is still agitated and requiring Precedex. Hemoglobin remains stable without further transfusion, renal function also stable. The patient can provide no history or review of systems because he is intubated and sedated. His brother Brett Canales is once again at the bedside.   Objective:  Med list reviewed  Vital signs in last 24 hrs: Vitals:   09/23/16 1100 09/23/16 1215  BP: 122/76 119/73  Pulse: 86   Resp: 10   Temp: 98.2 F (36.8 C)     Physical Exam   HEENT: sclera anicteric, Intubated  Neck: supple, no thyromegaly, JVD or lymphadenopathy  Cardiac: RRR without murmurs, S1S2 heard, + peripheral edema  Pulm: Rhonchi bilaterally, respiratory rate 20 and normal effort noted. He is on pressure support ventilation  Abdomen: soft, he has large volume ascites but is not tense. Unable to assess tenderness. active bowel sounds. No guarding or palpable hepatosplenomegaly, but limited by abdominal girth  Skin; warm and dry, no jaundice or rash  Recent Labs:   Recent Labs Lab 09/22/16 0853  09/22/16 1206 09/22/16 2001 09/23/16 0420  WBC 14.3*  --  12.0*  --  9.8  HGB 7.8*  < > 7.4* 8.3* 8.2*  HCT 23.1*  < > 21.9* 25.0* 24.5*  PLT 102*  --  89*  --  90*  < > = values in this interval not displayed.  Recent Labs Lab 09/22/16 0853  09/23/16 0420  NA 135  < > 137  K 6.7*  < > 4.6  CL 106  < > 107  CO2 23  < > 23  BUN 40*  < > 46*  ALBUMIN 1.9*  --   --   ALKPHOS 54  --   --   ALT 182*  --   --   AST 633*  --   --   GLUCOSE 131*  < > 119*  < > = values in this interval not displayed.  Recent Labs Lab 09/22/16 0742  INR 2.23    Chest x-ray today shows mild pulmonary venous congestion  @ Assessment:  Esophageal variceal bleed requiring  ligation for hemostasis 2 days ago Acute blood loss anemia Alcoholic cirrhosis Portal hypertensive ascites Coagulopathy Abnormal LFTs from ischemic hepatopathy  I am encouraged that he has no recurrent bleeding at this point. The large volume ascites may become an issue in the next few days if it impedes his ventilation and ability to extubate.  Plan: Discontinue octreotide Change Protonix drip over to split twice daily dosing Serial hemoglobin and hematocrit Monitor his ascites volume and abdominal exam. He is currently too unstable to leave the department for a radiology guided paracentesis and less it were an emergency. He is also coagulopathic, which raises those risks.  If he is able to extubate in the next day or 2, and if his renal function remains stable off pressors, we may be able to diuresis and rather than run the risk of paracentesis.  We will make those decisions day by day.  I spent a total of 40  minutes with the patient reviewing hospital notes, imaging reports, pathology (if applicable),  labs and examining the patient as well as establishing an assessment and plan that was discussed with the patient's family.  > 50% of time was spent in direct patient care.    Extensive discussion  was had with his brother, and all questions answered.  Charlie Pitter III Pager [phone] Mon-Fri 8a-5p [phone] after 5p, weekends, holidays

## 2016-09-24 ENCOUNTER — Inpatient Hospital Stay (HOSPITAL_COMMUNITY): Payer: PRIVATE HEALTH INSURANCE

## 2016-09-24 ENCOUNTER — Encounter (HOSPITAL_COMMUNITY): Payer: Self-pay | Admitting: *Deleted

## 2016-09-24 DIAGNOSIS — K7290 Hepatic failure, unspecified without coma: Secondary | ICD-10-CM

## 2016-09-24 DIAGNOSIS — K7682 Hepatic encephalopathy: Secondary | ICD-10-CM

## 2016-09-24 LAB — CULTURE, RESPIRATORY

## 2016-09-24 LAB — BASIC METABOLIC PANEL
ANION GAP: 8 (ref 5–15)
BUN: 36 mg/dL — ABNORMAL HIGH (ref 6–20)
CALCIUM: 7.1 mg/dL — AB (ref 8.9–10.3)
CHLORIDE: 108 mmol/L (ref 101–111)
CO2: 22 mmol/L (ref 22–32)
CREATININE: 0.67 mg/dL (ref 0.61–1.24)
GFR calc non Af Amer: >60 mL/min (ref >60)
Glucose, Bld: 149 mg/dL — ABNORMAL HIGH (ref 65–99)
Potassium: 4.2 mmol/L (ref 3.5–5.1)
SODIUM: 138 mmol/L (ref 135–145)

## 2016-09-24 LAB — PROTIME-INR
INR: 2.07
PROTHROMBIN TIME: 23.6 s — AB (ref 11.4–15.2)

## 2016-09-24 LAB — CBC
HCT: 26.1 % — ABNORMAL LOW (ref 39.0–52.0)
HEMOGLOBIN: 8.8 g/dL — AB (ref 13.0–17.0)
MCH: 30.7 pg (ref 26.0–34.0)
MCHC: 33.7 g/dL (ref 30.0–36.0)
MCV: 90.9 fL (ref 78.0–100.0)
PLATELETS: 109 10*3/uL — AB (ref 150–400)
RBC: 2.87 MIL/uL — AB (ref 4.22–5.81)
RDW: 19.4 % — ABNORMAL HIGH (ref 11.5–15.5)
WBC: 7.5 10*3/uL (ref 4.0–10.5)

## 2016-09-24 LAB — MAGNESIUM: MAGNESIUM: 1.9 mg/dL (ref 1.7–2.4)

## 2016-09-24 LAB — GLUCOSE, CAPILLARY
GLUCOSE-CAPILLARY: 103 mg/dL — AB (ref 65–99)
GLUCOSE-CAPILLARY: 130 mg/dL — AB (ref 65–99)
GLUCOSE-CAPILLARY: 131 mg/dL — AB (ref 65–99)
GLUCOSE-CAPILLARY: 136 mg/dL — AB (ref 65–99)
GLUCOSE-CAPILLARY: 141 mg/dL — AB (ref 65–99)
Glucose-Capillary: 147 mg/dL — ABNORMAL HIGH (ref 65–99)
Glucose-Capillary: 121 mg/dL — ABNORMAL HIGH (ref 65–99)

## 2016-09-24 LAB — LACTIC ACID, PLASMA: LACTIC ACID, VENOUS: 1.9 mmol/L (ref 0.5–1.9)

## 2016-09-24 LAB — CULTURE, RESPIRATORY W GRAM STAIN

## 2016-09-24 LAB — HEPATIC FUNCTION PANEL
ALBUMIN: 2.1 g/dL — AB (ref 3.5–5.0)
ALT: 277 U/L — ABNORMAL HIGH (ref 17–63)
AST: 670 U/L — ABNORMAL HIGH (ref 15–41)
Alkaline Phosphatase: 61 U/L (ref 38–126)
Bilirubin, Direct: 3.5 mg/dL — ABNORMAL HIGH (ref 0.1–0.5)
Indirect Bilirubin: 3.9 mg/dL — ABNORMAL HIGH (ref 0.3–0.9)
TOTAL PROTEIN: 5.4 g/dL — AB (ref 6.5–8.1)
Total Bilirubin: 7.4 mg/dL — ABNORMAL HIGH (ref 0.3–1.2)

## 2016-09-24 LAB — AMMONIA: Ammonia: 88 umol/L — ABNORMAL HIGH (ref 9–35)

## 2016-09-24 LAB — PHOSPHORUS: Phosphorus: 2.1 mg/dL — ABNORMAL LOW (ref 2.5–4.6)

## 2016-09-24 LAB — PROCALCITONIN: Procalcitonin: 1.45 ng/mL

## 2016-09-24 MED ORDER — SODIUM CHLORIDE 0.9% FLUSH
3.0000 mL | INTRAVENOUS | Status: DC | PRN
Start: 2016-09-24 — End: 2016-10-13
  Administered 2016-10-08: 3 mL via INTRAVENOUS
  Filled 2016-09-24: qty 3

## 2016-09-24 MED ORDER — FUROSEMIDE 10 MG/ML IJ SOLN
40.0000 mg | Freq: Three times a day (TID) | INTRAMUSCULAR | Status: AC
  Administered 2016-09-24 (×2): 40 mg via INTRAVENOUS
  Filled 2016-09-24 (×2): qty 4

## 2016-09-24 MED ORDER — LACTULOSE ENEMA
300.0000 mL | Freq: Two times a day (BID) | ORAL | Status: DC
  Administered 2016-09-24 – 2016-09-26 (×5): 300 mL via RECTAL
  Filled 2016-09-24 (×6): qty 300

## 2016-09-24 MED ORDER — SODIUM CHLORIDE 0.9% FLUSH
3.0000 mL | Freq: Two times a day (BID) | INTRAVENOUS | Status: DC
  Administered 2016-09-24 – 2016-10-13 (×34): 3 mL via INTRAVENOUS

## 2016-09-24 NOTE — Plan of Care (Signed)
Problem: Nutrition: Goal: Adequate nutrition will be maintained Outcome: Not Progressing Remains NPO

## 2016-09-24 NOTE — Progress Notes (Signed)
Progress Note   Subjective  Chief Complaint: esophageal variceal bleed  Pt remains about the same as yesterday. Per nursing they have weaned Levophed to , but patient seems less responsive today. Continues to be no further signs of bleeding. His brother Brett Canales is still at bedside.  Per nursing, questioning nutrition options?    Objective   Vital signs in last 24 hours: Temp:  [97 F (36.1 C)-98.6 F (37 C)] 98.6 F (37 C) (04/21 1115) Pulse Rate:  [68-104] 83 (04/21 1115) Resp:  [9-22] 16 (04/21 1115) BP: (83-147)/(50-91) 111/59 (04/21 1115) SpO2:  [92 %-97 %] 97 % (04/21 1115) FiO2 (%):  [40 %-50 %] 50 % (04/21 0800) Weight:  [241 lb 10 oz (109.6 kg)] 241 lb 10 oz (109.6 kg) (04/21 0253) Last BM Date: 09/23/16 General: Caucasian male-intubated and sedated Heart:  Regular rate and rhythm; no murmurs + peripheral edema Lungs: Respirations even and unlabored, lungs CTA bilaterally Pressure support ventilation Abdomen:  Soft, nontender and large volume ascites. Normal bowel sounds. Neurologic:  sedated  Intake/Output from previous day: 04/20 0701 - 04/21 0700 In: 3177 [I.V.:3027; IV Piggyback:150] Out: 1375 [Urine:1275; Stool:100] Intake/Output this shift: Total I/O In: 287.8 [I.V.:275.3; IV Piggyback:12.5] Out: 715 [Urine:715]  Lab Results:  Recent Labs  09/22/16 1206 09/22/16 2001 09/23/16 0420 09/24/16 0450  WBC 12.0*  --  9.8 7.5  HGB 7.4* 8.3* 8.2* 8.8*  HCT 21.9* 25.0* 24.5* 26.1*  PLT 89*  --  90* 109*   BMET  Recent Labs  09/23/16 0049 09/23/16 0420 09/24/16 0450  NA 137 137 138  K 4.7 4.6 4.2  CL 108 107 108  CO2 GLUCOSE 131* 119* 149*  BUN 45* 46* 36*  CREATININE 0.91 0.87 0.67  CALCIUM 6.5* 6.5* 7.1*   LFT  Recent Labs  09/24/16 0450  PROT 5.4*  ALBUMIN 2.1*  AST 670*  ALT 277*  ALKPHOS 61  BILITOT 7.4*  BILIDIR 3.5*  IBILI 3.9*   PT/INR  Recent Labs  09/22/16 0742 09/24/16 0450  LABPROT 25.1* 23.6*  INR  2.23 2.07    Studies/Results: Dg Chest Port 1 View  Result Date: 09/24/2016 CLINICAL DATA:  Ventilator dependent. EXAM: PORTABLE CHEST 1 VIEW COMPARISON:  09/23/2016 FINDINGS: Endotracheal tube terminates 3.3 cm above the carina. Left jugular catheter terminates over the upper SVC. Cardiomediastinal silhouette is unchanged. Lung volumes remain diminished with mild pulmonary vascular congestion. Patchy left greater than right base airspace opacities have mildly improved. There may be a small left pleural effusion. No pneumothorax is seen. IMPRESSION: Persistent low lung volumes with slightly improved aeration of the lung bases. Electronically Signed   By: Sebastian Ache M.D.   On: 09/24/2016 07:18   Dg Chest Port 1 View  Result Date: 09/23/2016 CLINICAL DATA:  Endotracheal tube.  Respiratory failure. EXAM: PORTABLE CHEST 1 VIEW COMPARISON:  09/22/2016 FINDINGS: Endotracheal tube terminates 2.8 cm above carina.Left internal jugular line tip unchanged with tip at high SVC. Mildly degraded exam due to AP portable technique and patient body habitus. Cardiomegaly accentuated by AP portable technique. Probable layering left pleural effusion. No pneumothorax. Mild pulmonary venous congestion, superimposed upon low lung volumes. Left worse than right base airspace disease. IMPRESSION: Mild pulmonary venous congestion. Layering left pleural effusion with bibasilar airspace disease, similar. Electronically Signed   By: Jeronimo Greaves M.D.   On: 09/23/2016 08:11       Assessment / Plan:   Assessment: 1. Esophageal Variceal Bleed requiring ligation  for hemostasis 3d ago 2. Acute blood loss anemia; hgb stable x2d 3. Alcoholic Cirrhosis 4. Portal hypertensive ascites 5. Coagulopathy 6. Abnormal LFT's from ischemic hepatopathy  Plan: 1. Continue with current recommendations 2. Will discuss nutrition recommendations with Dr. Myrtie Neither 3. Please await further recommendations from Dr. Myrtie Neither later today  Thank you  for your kind consultation, we will continue to follow   LOS: 3 days   Unk Lightning  09/24/2016, 11:34 AM  Pager # [phone]

## 2016-09-24 NOTE — Progress Notes (Signed)
Remains on pressors.  Didn't tolerate SBT >> low Vt.  BP (!) 142/78   Pulse 75   Temp 97.3 F (36.3 C)   Resp (!) 22   Ht 6' (1.829 m)   Wt 241 lb 10 oz (109.6 kg)   SpO2 94%   BMI 32.77 kg/m   I/O last 3 completed shifts: In: 5855 [I.V.:5355; Blood:300; IV Piggyback:200] Out: 3250 [Urine:3150; Stool:100]  RASS -3.  ETT in place.  HR regular.  No wheeze.  Abd distended with increased tympany.  2+ edema.  CMP Latest Ref Rng & Units 09/24/2016 09/23/2016 09/23/2016  Glucose 65 - 99 mg/dL 914(N) 829(F) 621(H)  BUN 6 - 20 mg/dL 08(M) 57(Q) 46(N)  Creatinine 0.61 - 1.24 mg/dL 6.29 5.28 4.13  Sodium 135 - 145 mmol/L 138 137 137  Potassium 3.5 - 5.1 mmol/L 4.2 4.6 4.7  Chloride 101 - 111 mmol/L 108 107 108  CO2 22 - 32 mmol/L Calcium 8.9 - 10.3 mg/dL 7.1(L) 6.5(L) 6.5(L)  Total Protein 6.5 - 8.1 g/dL 2.4(M) - -  Total Bilirubin 0.3 - 1.2 mg/dL 7.4(H) - -  Alkaline Phos 38 - 126 U/L 61 - -  AST 15 - 41 U/L 670(H) - -  ALT 17 - 63 U/L 277(H) - -   CBC Latest Ref Rng & Units 09/24/2016 09/23/2016 09/22/2016  WBC 4.0 - 10.5 K/uL 7.5 9.8 -  Hemoglobin 13.0 - 17.0 g/dL 0.1(U) 2.7(O) 8.3(L)  Hematocrit 39.0 - 52.0 % 26.1(L) 24.5(L) 25.0(L)  Platelets 150 - 400 K/uL 109(L) 90(L) -   ABG    Component Value Date/Time   PHART 7.402 09/22/2016 1500   PCO2ART 34.3 09/22/2016 1500   PO2ART 73.0 (L) 09/22/2016 1500   HCO3 21.3 09/22/2016 1500   TCO2 22 09/22/2016 1500   ACIDBASEDEF 3.0 (H) 09/22/2016 1500   O2SAT 94.0 09/22/2016 1500   Lab Results  Component Value Date   INR 2.07 09/24/2016   INR 2.23 09/22/2016   INR 2.16 09/21/2016   CBG (last 3)   Recent Labs  09/23/16 1938 09/24/16 0011 09/24/16 0406  GLUCAP 141* 130* 147*    Dg Chest Port 1 View  Result Date: 09/24/2016 CLINICAL DATA:  Ventilator dependent. EXAM: PORTABLE CHEST 1 VIEW COMPARISON:  09/23/2016 FINDINGS: Endotracheal tube terminates 3.3 cm above the carina. Left jugular catheter terminates over  the upper SVC. Cardiomediastinal silhouette is unchanged. Lung volumes remain diminished with mild pulmonary vascular congestion. Patchy left greater than right base airspace opacities have mildly improved. There may be a small left pleural effusion. No pneumothorax is seen. IMPRESSION: Persistent low lung volumes with slightly improved aeration of the lung bases. Electronically Signed   By: Sebastian Ache M.D.   On: 09/24/2016 07:18   Dg Chest Port 1 View  Result Date: 09/23/2016 CLINICAL DATA:  Endotracheal tube.  Respiratory failure. EXAM: PORTABLE CHEST 1 VIEW COMPARISON:  09/22/2016 FINDINGS: Endotracheal tube terminates 2.8 cm above carina.Left internal jugular line tip unchanged with tip at high SVC. Mildly degraded exam due to AP portable technique and patient body habitus. Cardiomegaly accentuated by AP portable technique. Probable layering left pleural effusion. No pneumothorax. Mild pulmonary venous congestion, superimposed upon low lung volumes. Left worse than right base airspace disease. IMPRESSION: Mild pulmonary venous congestion. Layering left pleural effusion with bibasilar airspace disease, similar. Electronically Signed   By: Jeronimo Greaves M.D.   On: 09/23/2016 08:11    Assessment/plan:  Acute respiratory failure with compromised airway. - adjust  FiO2 to keep SpO2 > 92% - f/u CXR  Septic shock with aspiration pneumonia. - pressors to keep MAP > 65 - continue Abx  Acute blood loss anemia with hemorrhagic shock from variceal bleeding. Thrombocytopenia. - f/u CBC  Upper GI bleeding from esophageal varices s/p banding. ETOH with cirrhosis and ascites. - protonix BID - thiamine, folic acid  Hypervolemia. - lasix 40 mg IV q8h x 2 on 4/21  Acute encephalopathy with elevated ammonia. - RASS goal 0 to -1  CC time 32 minutes  Coralyn Helling, MD [facility] Pulmonary/Critical Care 09/24/2016, 7:53 AM Pager:  [phone] After 3pm call: [phone]

## 2016-09-25 ENCOUNTER — Inpatient Hospital Stay (HOSPITAL_COMMUNITY): Payer: PRIVATE HEALTH INSURANCE

## 2016-09-25 DIAGNOSIS — K7460 Unspecified cirrhosis of liver: Secondary | ICD-10-CM

## 2016-09-25 DIAGNOSIS — K769 Liver disease, unspecified: Secondary | ICD-10-CM

## 2016-09-25 DIAGNOSIS — J9690 Respiratory failure, unspecified, unspecified whether with hypoxia or hypercapnia: Secondary | ICD-10-CM

## 2016-09-25 LAB — TYPE AND SCREEN
ABO/RH(D): AB POS
Antibody Screen: NEGATIVE
UNIT DIVISION: 0
UNIT DIVISION: 0
UNIT DIVISION: 0
UNIT DIVISION: 0
Unit division: 0
Unit division: 0
Unit division: 0
Unit division: 0
Unit division: 0
Unit division: 0

## 2016-09-25 LAB — BPAM RBC
BLOOD PRODUCT EXPIRATION DATE: 201804282359
BLOOD PRODUCT EXPIRATION DATE: 201805012359
BLOOD PRODUCT EXPIRATION DATE: 201805082359
BLOOD PRODUCT EXPIRATION DATE: 201805082359
BLOOD PRODUCT EXPIRATION DATE: 201805092359
BLOOD PRODUCT EXPIRATION DATE: 201805092359
BLOOD PRODUCT EXPIRATION DATE: 201805102359
BLOOD PRODUCT EXPIRATION DATE: 201805132359
Blood Product Expiration Date: 201805092359
Blood Product Expiration Date: 201805152359
ISSUE DATE / TIME: 201804180756
ISSUE DATE / TIME: 201804180756
ISSUE DATE / TIME: 201804180923
ISSUE DATE / TIME: 201804181049
ISSUE DATE / TIME: 201804180923
ISSUE DATE / TIME: 201804181049
ISSUE DATE / TIME: 201804191744
UNIT TYPE AND RH: 6200
UNIT TYPE AND RH: 6200
UNIT TYPE AND RH: 8400
UNIT TYPE AND RH: 8400
UNIT TYPE AND RH: 8400
UNIT TYPE AND RH: 9500
Unit Type and Rh: 6200
Unit Type and Rh: 6200
Unit Type and Rh: 8400
Unit Type and Rh: 9500

## 2016-09-25 LAB — BASIC METABOLIC PANEL
ANION GAP: 5 (ref 5–15)
BUN: 37 mg/dL — ABNORMAL HIGH (ref 6–20)
CALCIUM: 7.3 mg/dL — AB (ref 8.9–10.3)
CO2: 24 mmol/L (ref 22–32)
CREATININE: 0.76 mg/dL (ref 0.61–1.24)
Chloride: 112 mmol/L — ABNORMAL HIGH (ref 101–111)
GLUCOSE: 114 mg/dL — AB (ref 65–99)
Potassium: 3.3 mmol/L — ABNORMAL LOW (ref 3.5–5.1)
Sodium: 141 mmol/L (ref 135–145)

## 2016-09-25 LAB — GLUCOSE, CAPILLARY
GLUCOSE-CAPILLARY: 146 mg/dL — AB (ref 65–99)
GLUCOSE-CAPILLARY: 120 mg/dL — AB (ref 65–99)
Glucose-Capillary: 114 mg/dL — ABNORMAL HIGH (ref 65–99)
Glucose-Capillary: 117 mg/dL — ABNORMAL HIGH (ref 65–99)
Glucose-Capillary: 134 mg/dL — ABNORMAL HIGH (ref 65–99)

## 2016-09-25 LAB — CBC
HCT: 23.8 % — ABNORMAL LOW (ref 39.0–52.0)
Hemoglobin: 7.9 g/dL — ABNORMAL LOW (ref 13.0–17.0)
MCH: 30.9 pg (ref 26.0–34.0)
MCHC: 33.2 g/dL (ref 30.0–36.0)
MCV: 93 fL (ref 78.0–100.0)
Platelets: 79 10*3/uL — ABNORMAL LOW (ref 150–400)
RBC: 2.56 MIL/uL — ABNORMAL LOW (ref 4.22–5.81)
RDW: 19.9 % — ABNORMAL HIGH (ref 11.5–15.5)
WBC: 4.7 10*3/uL (ref 4.0–10.5)

## 2016-09-25 LAB — MAGNESIUM: MAGNESIUM: 1.9 mg/dL (ref 1.7–2.4)

## 2016-09-25 LAB — AMMONIA: AMMONIA: 47 umol/L — AB (ref 9–35)

## 2016-09-25 LAB — PHOSPHORUS: PHOSPHORUS: 2.1 mg/dL — AB (ref 2.5–4.6)

## 2016-09-25 MED ORDER — FUROSEMIDE 10 MG/ML IJ SOLN
40.0000 mg | Freq: Three times a day (TID) | INTRAMUSCULAR | Status: AC
  Administered 2016-09-25 (×3): 40 mg via INTRAVENOUS
  Filled 2016-09-25 (×3): qty 4

## 2016-09-25 MED ORDER — POTASSIUM PHOSPHATES 15 MMOLE/5ML IV SOLN
40.0000 meq | Freq: Once | INTRAVENOUS | Status: AC
  Administered 2016-09-25: 40 meq via INTRAVENOUS
  Filled 2016-09-25: qty 9.09

## 2016-09-25 NOTE — Progress Notes (Signed)
Off pressors. Confused and agitated, on sedatives.   BP 105/80   Pulse 83   Temp (!) 96.6 F (35.9 C)   Resp (!) 28   Ht 6' (1.829 m)   Wt 236 lb 1.8 oz (107.1 kg)   SpO2 95%   BMI 32.02 kg/m   I/O last 3 completed shifts: In: 3771 [I.V.:2496; Other:1000; IV Piggyback:275] Out: 3675 [Urine:3325; Stool:350]  Vitals reviewed. General: getting up in bed, confused, not answering questions HEENT: eyes open, PERRL Cardiac: RRR Pulm: coarse BS, ETT in place. No wheezing.  Abd: distended abdomen.  Ext: warm and well perfused, 2+ pitting edema.  Neuro: confused, not following any commands, not answering questions.   CMP Latest Ref Rng & Units 09/25/2016 09/24/2016 09/23/2016  Glucose 65 - 99 mg/dL 454(U) 981(X) 914(N)  BUN 6 - 20 mg/dL 82(N) 56(O) 13(Y)  Creatinine 0.61 - 1.24 mg/dL 8.65 7.84 6.96  Sodium 135 - 145 mmol/L 141 138 137  Potassium 3.5 - 5.1 mmol/L 3.3(L) 4.2 4.6  Chloride 101 - 111 mmol/L 112(H) 108 107  CO2 22 - 32 mmol/L Calcium 8.9 - 10.3 mg/dL 7.3(L) 7.1(L) 6.5(L)  Total Protein 6.5 - 8.1 g/dL - 5.4(L) -  Total Bilirubin 0.3 - 1.2 mg/dL - 7.4(H) -  Alkaline Phos 38 - 126 U/L - 61 -  AST 15 - 41 U/L - 670(H) -  ALT 17 - 63 U/L - 277(H) -   CBC Latest Ref Rng & Units 09/25/2016 09/24/2016 09/23/2016  WBC 4.0 - 10.5 K/uL 4.7 7.5 9.8  Hemoglobin 13.0 - 17.0 g/dL 7.9(L) 8.8(L) 8.2(L)  Hematocrit 39.0 - 52.0 % 23.8(L) 26.1(L) 24.5(L)  Platelets 150 - 400 K/uL 79(L) 109(L) 90(L)   ABG    Component Value Date/Time   PHART 7.402 09/22/2016 1500   PCO2ART 34.3 09/22/2016 1500   PO2ART 73.0 (L) 09/22/2016 1500   HCO3 21.3 09/22/2016 1500   TCO2 22 09/22/2016 1500   ACIDBASEDEF 3.0 (H) 09/22/2016 1500   O2SAT 94.0 09/22/2016 1500   Lab Results  Component Value Date   INR 2.07 09/24/2016   INR 2.23 09/22/2016   INR 2.16 09/21/2016   CBG (last 3)   Recent Labs  09/24/16 2329 09/25/16 0325 09/25/16 0812  GLUCAP 103* 114* 117*    Dg Chest Port 1  View  Result Date: 09/25/2016 CLINICAL DATA:  Respiratory failure EXAM: PORTABLE CHEST 1 VIEW COMPARISON:  09/24/2016 FINDINGS: Endotracheal tube tip is approximately 2.1 cm superior to the carina. There are low lung volumes bilaterally. No change in small left effusion and dense left lower lobe consolidation. Stable borderline cardiomegaly. Left-sided central venous catheter tip overlies the upper SVC. IMPRESSION: 1. No significant interval change in small left pleural effusion and dense left lower lobe consolidation. Mild right basilar atelectasis. 2. Hypoventilatory changes. Electronically Signed   By: Jasmine Pang M.D.   On: 09/25/2016 03:57   Dg Chest Port 1 View  Result Date: 09/24/2016 CLINICAL DATA:  Ventilator dependent. EXAM: PORTABLE CHEST 1 VIEW COMPARISON:  09/23/2016 FINDINGS: Endotracheal tube terminates 3.3 cm above the carina. Left jugular catheter terminates over the upper SVC. Cardiomediastinal silhouette is unchanged. Lung volumes remain diminished with mild pulmonary vascular congestion. Patchy left greater than right base airspace opacities have mildly improved. There may be a small left pleural effusion. No pneumothorax is seen. IMPRESSION: Persistent low lung volumes with slightly improved aeration of the lung bases. Electronically Signed   By: Jolaine Click.D.  On: 09/24/2016 07:18    Assessment/plan:  Acute respiratory failure with compromised airway. L small pleur eff Possible asp PNA - keep intubated today due to AMS. Sat >92% goal.  - give lasix again today.   Septic shock with aspiration pneumonia. -off pressors, continue Abx Sputum cx +candida - will not treat this.   Acute blood loss anemia with hemorrhagic shock from variceal bleeding. Thrombocytopenia. Stable for now - trend CBC  Upper GI bleeding from esophageal varices s/p banding. ETOH with cirrhosis and ascites. - protonix BID - thiamine, folic acid  Hypervolemia. -giving lasix  HypoK+ and  Hypophos - repleting  Acute encephalopathy with hx of alcohol abuse in the setting of shock - RASS goal 0 to -1,  cont lactulose enema, precedex, fent, prn versed.   Hyacinth Meeker, M.D.

## 2016-09-25 NOTE — Progress Notes (Addendum)
[facility] Progress Note  Chief Complaint: Esophageal variceal bleed  Subjective  History:  Josian remains critically ill in the ICU. He is still ventilated and sedated. Pressors are now off, and attempts are being made to wean back as sedation with the hopes of extubating in the near future. I started lactulose enemas on him last evening, and he is having copious loose stool. There is no evidence of recurrent GI bleeding since the initial upper endoscopy with banding. The patient can provide no history or review of systems at this point due to his condition.   Objective:  Med list reviewed  Vital signs in last 24 hrs: Vitals:   09/25/16 0700 09/25/16 0812  BP: (!) 104/56 105/80  Pulse: 65 83  Resp: 16 (!) 28  Temp: (!) 96.6 F (35.9 C)     Physical Exam   HEENT: sclera icteric, ET tube in position  Neck: supple, no thyromegaly, JVD or lymphadenopathy  Cardiac: RRR without murmurs, S1S2 heard, no peripheral edema  Pulm: clear to auscultation bilaterally, normal RR and effort noted. On PRBC ventilator settings, normal pressures in good volumes  Abdomen: soft, cannot assess tenderness, with active bowel sounds. He is volume of ascites seems similar today than yesterday. It is certainly not tense  Skin; warm and dry, + mild jaundice , no rash  Recent Labs:   Recent Labs Lab 09/23/16 0420 09/24/16 0450 09/25/16 0338  WBC 9.8 7.5 4.7  HGB 8.2* 8.8* 7.9*  HCT 24.5* 26.1* 23.8*  PLT 90* 109* 79*    Recent Labs Lab 09/24/16 0450 09/25/16 0338  NA 138 141  K 4.2 3.3*  CL 108 112*  CO2 22 24  BUN 36* 37*  ALBUMIN 2.1*  --   ALKPHOS 61  --   ALT 277*  --   AST 670*  --   GLUCOSE 149* 114*    Recent Labs Lab 09/24/16 0450  INR 2.07     @ AssessmentAnd plan Esophageal variceal bleed requiring band ligation for hemostasis 3 days ago Acute blood loss anemia Alcoholic cirrhosis Portal hypertensive ascites. The volume of it is  manageable right now he does not need a paracentesis. Continue diuresis as per the critical care team. Coagulopathy from underlying liver disease Abnormal LFTs from ischemic hepatopathy, no recheck yet today. Hepatic encephalopathy from underlying cirrhosis exacerbated by upper GI bleeding  Unfortunately, he shows no signs of rebleeding and his renal function is stable. The goal right now is waking him up and extubating him.  When that happens, oral lactulose can be started at 20 g 3 times daily and the lactulose enemas can then be discontinued.  No further recommendations offer at this time, we will follow him daily.  I spent a total of 25  minutes with the patient reviewing hospital notes, imaging reports, pathology (if applicable),  labs and examining the patient as well as establishing an assessment and plan that was discussed with the patient.  > 50% of time was spent in direct patient care.      Charlie Pitter III Pager [phone] Mon-Fri 8a-5p [phone] after 5p, weekends, holidays

## 2016-09-25 NOTE — Plan of Care (Signed)
Problem: Coping: Goal: Level of anxiety will decrease Outcome: Not Progressing Pt extremely agitated with attempted WUA without reducing Precedex drip below 0.8 mcg/kg/min. Pt immediately attempts self-extubation, (-) FC, verbal consoling unsuccessful.    Problem: Nutritional: Goal: Intake of prescribed amount of daily calories will improve Outcome: Not Progressing After EGD clipping of multiple esophageal varicese, Md delaying placing feeding tube for the time being. Pt NPO without nutrient intake x 3 days.

## 2016-09-26 ENCOUNTER — Inpatient Hospital Stay (HOSPITAL_COMMUNITY): Payer: PRIVATE HEALTH INSURANCE

## 2016-09-26 DIAGNOSIS — K7460 Unspecified cirrhosis of liver: Secondary | ICD-10-CM

## 2016-09-26 DIAGNOSIS — K769 Liver disease, unspecified: Secondary | ICD-10-CM

## 2016-09-26 LAB — CBC
HEMATOCRIT: 26.7 % — AB (ref 39.0–52.0)
Hemoglobin: 8.9 g/dL — ABNORMAL LOW (ref 13.0–17.0)
MCH: 31.3 pg (ref 26.0–34.0)
MCHC: 33.3 g/dL (ref 30.0–36.0)
MCV: 94 fL (ref 78.0–100.0)
PLATELETS: 93 10*3/uL — AB (ref 150–400)
RBC: 2.84 MIL/uL — AB (ref 4.22–5.81)
RDW: 20.4 % — AB (ref 11.5–15.5)
WBC: 6.9 10*3/uL (ref 4.0–10.5)

## 2016-09-26 LAB — BASIC METABOLIC PANEL
ANION GAP: 9 (ref 5–15)
Anion gap: 8 (ref 5–15)
BUN: 28 mg/dL — ABNORMAL HIGH (ref 6–20)
BUN: 22 mg/dL — AB (ref 6–20)
CALCIUM: 7.5 mg/dL — AB (ref 8.9–10.3)
CHLORIDE: 111 mmol/L (ref 101–111)
CHLORIDE: 114 mmol/L — AB (ref 101–111)
CO2: 22 mmol/L (ref 22–32)
CO2: 21 mmol/L — ABNORMAL LOW (ref 22–32)
CREATININE: 0.73 mg/dL (ref 0.61–1.24)
Calcium: 7.3 mg/dL — ABNORMAL LOW (ref 8.9–10.3)
Creatinine, Ser: 0.71 mg/dL (ref 0.61–1.24)
GFR calc Af Amer: >60 mL/min (ref >60)
GFR calc non Af Amer: >60 mL/min (ref >60)
GFR calc non Af Amer: >60 mL/min (ref >60)
Glucose, Bld: 114 mg/dL — ABNORMAL HIGH (ref 65–99)
Glucose, Bld: 120 mg/dL — ABNORMAL HIGH (ref 65–99)
POTASSIUM: 3.2 mmol/L — AB (ref 3.5–5.1)
Potassium: 3 mmol/L — ABNORMAL LOW (ref 3.5–5.1)
SODIUM: 143 mmol/L (ref 135–145)
Sodium: 142 mmol/L (ref 135–145)

## 2016-09-26 LAB — GLUCOSE, CAPILLARY
GLUCOSE-CAPILLARY: 104 mg/dL — AB (ref 65–99)
GLUCOSE-CAPILLARY: 114 mg/dL — AB (ref 65–99)
Glucose-Capillary: 107 mg/dL — ABNORMAL HIGH (ref 65–99)
Glucose-Capillary: 108 mg/dL — ABNORMAL HIGH (ref 65–99)
Glucose-Capillary: 110 mg/dL — ABNORMAL HIGH (ref 65–99)

## 2016-09-26 LAB — MAGNESIUM: Magnesium: 1.8 mg/dL (ref 1.7–2.4)

## 2016-09-26 LAB — AMMONIA: Ammonia: 45 umol/L — ABNORMAL HIGH (ref 9–35)

## 2016-09-26 LAB — PHOSPHORUS: PHOSPHORUS: 3.6 mg/dL (ref 2.5–4.6)

## 2016-09-26 MED ORDER — MIDAZOLAM HCL 2 MG/2ML IJ SOLN
1.0000 mg | INTRAMUSCULAR | Status: DC | PRN
  Administered 2016-09-26 (×2): 2 mg via INTRAVENOUS
  Filled 2016-09-26 (×2): qty 2

## 2016-09-26 MED ORDER — POTASSIUM CHLORIDE 20 MEQ/15ML (10%) PO SOLN: 40.0000 meq | Freq: Three times a day (TID) | ORAL | Status: DC

## 2016-09-26 MED ORDER — FUROSEMIDE 10 MG/ML IJ SOLN
40.0000 mg | Freq: Three times a day (TID) | INTRAMUSCULAR | Status: AC
  Administered 2016-09-26: 40 mg via INTRAVENOUS
  Filled 2016-09-26: qty 4

## 2016-09-26 MED ORDER — MAGNESIUM SULFATE 2 GM/50ML IV SOLN
2.0000 g | Freq: Once | INTRAVENOUS | Status: AC
  Administered 2016-09-26: 2 g via INTRAVENOUS
  Filled 2016-09-26: qty 50

## 2016-09-26 MED ORDER — MIDAZOLAM HCL 2 MG/2ML IJ SOLN
INTRAMUSCULAR | Status: AC
  Filled 2016-09-26: qty 2

## 2016-09-26 MED ORDER — MIDAZOLAM HCL 2 MG/2ML IJ SOLN
2.0000 mg | INTRAMUSCULAR | Status: DC | PRN
  Administered 2016-09-26 – 2016-09-27 (×5): 2 mg via INTRAVENOUS
  Filled 2016-09-26 (×4): qty 2

## 2016-09-26 MED ORDER — SODIUM CHLORIDE 0.9 % IV SOLN
30.0000 meq | Freq: Once | INTRAVENOUS | Status: AC
  Administered 2016-09-26: 30 meq via INTRAVENOUS
  Filled 2016-09-26: qty 15

## 2016-09-26 MED ORDER — FUROSEMIDE 10 MG/ML IJ SOLN
40.0000 mg | Freq: Three times a day (TID) | INTRAMUSCULAR | Status: DC
  Administered 2016-09-26: 40 mg via INTRAVENOUS
  Filled 2016-09-26: qty 4

## 2016-09-26 NOTE — Progress Notes (Signed)
eLink Physician-Brief Progress Note Patient Name: Ray Hayden DOB: 05-10-1966 MRN: [id number]   Date of Service  09/26/2016  HPI/Events of Note  Nurse reports that patient is less responsive than earlier today.   eICU Interventions  Will order: 1. Hold all sedatives and narcotics.    If not improved in 2 hours with need Head CT Scan w/o contrast.      Intervention Category Major Interventions: Change in mental status - evaluation and management  Sommer,Steven Eugene 09/26/2016, 10:32 PM

## 2016-09-26 NOTE — Progress Notes (Signed)
[facility] ADULT ICU REPLACEMENT PROTOCOL FOR AM LAB REPLACEMENT ONLY  The patient does apply for the [facility] Adult ICU Electrolyte Replacment Protocol based on the criteria listed below:   1. Is GFR >/= 40 ml/min? Yes.    [name] GFR today is >60 2. Is urine output >/= 0.5 ml/kg/hr for the last 6 hours? Yes.   [name] UOP is 1.5 ml/kg/hr 3. Is BUN < 60 mg/dL? Yes.    [name] BUN today is 28 4. Abnormal electrolyte(s): K+3.2  Mg 1.8 5. Ordered repletion with: Protocol 6. If a panic level lab has been reported, has the CCM MD in charge been notified? No..   Physician:  Adrian Blackwater 09/26/2016 5:12 AM

## 2016-09-26 NOTE — Progress Notes (Signed)
eLink Physician-Brief Progress Note Patient Name: Ray Hayden DOB: 1965-06-10 MRN: [id number]   Date of Service  09/26/2016  HPI/Events of Note  Agitated despite fent gtt and percdex gtt  eICU Interventions  Increase versed prn range      Intervention Category Major Interventions: Delirium, psychosis, severe agitation - evaluation and management  [name] 09/26/2016, 6:52 AM

## 2016-09-26 NOTE — Plan of Care (Signed)
Problem: Role Relationship: Goal: Method of communication will improve Outcome: Not Progressing Pt requiring moderate sedation with Precedex and Fentyanl to maintain ordered RASS 0 to -1. With WUA, Pt becomes extremely agitated and thrashes in bed when sedation gradually weaned down, requiring PRN bolus' to obtain calm. Verbal reassurance inefffective.

## 2016-09-26 NOTE — Progress Notes (Signed)
Remains Off pressors. On fent and precedex gtt. Confused and agitated overnight Needed to increase versed overnight.   BP 105/60   Pulse 65   Temp 98.2 F (36.8 C)   Resp 16   Ht 6' (1.829 m)   Wt 236 lb 8.9 oz (107.3 kg)   SpO2 100%   BMI 32.08 kg/m   I/O last 3 completed shifts: In: 76 [I.V.:2254; Other:2300; IV Piggyback:1040] Out: 6495 [Urine:3920; Stool:2575]  Vitals reviewed. General: lying in bed, sedated, wakes up and opens eyes, does not follow any commands HEENT: eyes open, PERRL Cardiac: RRR, loud systolic murmur.  Pulm: coarse BS, ETT in place. No wheezing.  Abd: distended abdomen.  Ext: warm and well perfused, 2+ pitting edema.  Neuro: confused, not following any commands, not answering questions.   CMP Latest Ref Rng & Units 09/26/2016 09/25/2016 09/24/2016  Glucose 65 - 99 mg/dL 161(W) 960(A) 540(J)  BUN 6 - 20 mg/dL 81(X) 91(Y) 78(G)  Creatinine 0.61 - 1.24 mg/dL 9.56 2.13 0.86  Sodium 135 - 145 mmol/L 142 141 138  Potassium 3.5 - 5.1 mmol/L 3.2(L) 3.3(L) 4.2  Chloride 101 - 111 mmol/L 111 112(H) 108  CO2 22 - 32 mmol/L Calcium 8.9 - 10.3 mg/dL 7.5(L) 7.3(L) 7.1(L)  Total Protein 6.5 - 8.1 g/dL - - 5.4(L)  Total Bilirubin 0.3 - 1.2 mg/dL - - 7.4(H)  Alkaline Phos 38 - 126 U/L - - 61  AST 15 - 41 U/L - - 670(H)  ALT 17 - 63 U/L - - 277(H)   CBC Latest Ref Rng & Units 09/26/2016 09/25/2016 09/24/2016  WBC 4.0 - 10.5 K/uL 6.9 4.7 7.5  Hemoglobin 13.0 - 17.0 g/dL 5.7(Q) 7.9(L) 8.8(L)  Hematocrit 39.0 - 52.0 % 26.7(L) 23.8(L) 26.1(L)  Platelets 150 - 400 K/uL 93(L) 79(L) 109(L)   ABG    Component Value Date/Time   PHART 7.402 09/22/2016 1500   PCO2ART 34.3 09/22/2016 1500   PO2ART 73.0 (L) 09/22/2016 1500   HCO3 21.3 09/22/2016 1500   TCO2 22 09/22/2016 1500   ACIDBASEDEF 3.0 (H) 09/22/2016 1500   O2SAT 94.0 09/22/2016 1500   Lab Results  Component Value Date   INR 2.07 09/24/2016   INR 2.23 09/22/2016   INR 2.16 09/21/2016   CBG (last  3)   Recent Labs  09/25/16 2014 09/26/16 0000 09/26/16 0407  GLUCAP 134* 104* 107*    Dg Chest Port 1 View  Result Date: 09/25/2016 CLINICAL DATA:  Respiratory failure EXAM: PORTABLE CHEST 1 VIEW COMPARISON:  09/24/2016 FINDINGS: Endotracheal tube tip is approximately 2.1 cm superior to the carina. There are low lung volumes bilaterally. No change in small left effusion and dense left lower lobe consolidation. Stable borderline cardiomegaly. Left-sided central venous catheter tip overlies the upper SVC. IMPRESSION: 1. No significant interval change in small left pleural effusion and dense left lower lobe consolidation. Mild right basilar atelectasis. 2. Hypoventilatory changes. Electronically Signed   By: Jasmine Pang M.D.   On: 09/25/2016 03:57   Assessment/plan:  Acute respiratory failure with compromised airway. L small pleur eff Possible asp PNA - keep intubated until agitation and AMS improves. Sat >92% goal.  - give lasix again today.   Septic shock with aspiration pneumonia. Zosyn 4/19 >> resp cx +candida -off pressors, d/c abx today Sputum cx +candida - will not treat this.   Acute blood loss anemia with hemorrhagic shock from variceal bleeding. Thrombocytopenia. Stable for now - trend CBC  Upper GI bleeding  from esophageal varices s/p banding. ETOH with cirrhosis and ascites. - protonix BID - thiamine, folic acid  Hypervolemia. -giving lasix  HypoK+ and Hypophos - repleting  Acute encephalopathy with hx of alcohol abuse in the setting of shock ICU delirium  - RASS goal 0 to -1,  cont lactulose enema, precedex, fent, prn versed.  Switch to lactulose PO when extubated.   Hyacinth Meeker, M.D.   Attending Note:  51 year old male with etoh history and upper GI bleeding who was intubated due to agitation.  Remains on pressors and precedex.  Weaned this AM an failed weaning due to agitation.  On exam, lungs with coarse BS diffusely.  I reviewed CXR myself,  ETT in good position, low lung volumes.  Will continue diureses.  Continue weaning efforts.  Maintain intubated given agitation and failure.  Will attempt extubaiton in AM.  Discussed with bedside RN.  Sister updated bedside.  The patient is critically ill with multiple organ systems failure and requires high complexity decision making for assessment and support, frequent evaluation and titration of therapies, application of advanced monitoring technologies and extensive interpretation of multiple databases.   Critical Care Time devoted to patient care services described in this note is  35  Minutes. This time reflects time of care of this signee Dr Koren Bound. This critical care time does not reflect procedure time, or teaching time or supervisory time of PA/NP/Med student/Med Resident etc but could involve care discussion time.  Alyson Reedy, M.D. [facility] Pulmonary/Critical Care Medicine. Pager: [phone]. After hours pager: [phone].

## 2016-09-26 NOTE — Progress Notes (Signed)
eLink Physician-Brief Progress Note Patient Name: Ray Hayden DOB: 1965/08/05 MRN: [id number]   Date of Service  09/26/2016  HPI/Events of Note  Agitiation  eICU Interventions  Will change Versed dose to 2 mg IV Q 1 hour PRN agitation/sedation.      Intervention Category Minor Interventions: Agitation / anxiety - evaluation and management  Lenell Antu 09/26/2016, 5:17 PM

## 2016-09-26 NOTE — Progress Notes (Signed)
eLink Physician-Brief Progress Note Patient Name: Ray Hayden DOB: 1966/03/12 MRN: [id number]   Date of Service  09/26/2016  HPI/Events of Note  K+ = 3.0 and Creatinine = 0.73.  eICU Interventions  Will replace K+.     Intervention Category Major Interventions: Electrolyte abnormality - evaluation and management  Ray Hayden 09/26/2016, 7:02 PM

## 2016-09-26 NOTE — Plan of Care (Signed)
Problem: Activity: Goal: Risk for activity intolerance will decrease Outcome: Not Progressing Pt has been intubated for multiple days and unable to participate in any activity.

## 2016-09-26 NOTE — Progress Notes (Signed)
Patient switched back to full support due to excessive agitation.

## 2016-09-26 NOTE — Progress Notes (Signed)
Daily Rounding Note  09/26/2016, 10:59 AM  LOS: 5 days   SUBJECTIVE:   Chief complaint: confusion, agitation but family says he was appropriate earlier this AM.  Remains intubated on precedex, Fentanyl, levophed drips.  Stool in flexiseal is mostly clear with black sediment at bottom.        OBJECTIVE:         Vital signs in last 24 hours:    Temp:  [96.1 F (35.6 C)-99.7 F (37.6 C)] 98.2 F (36.8 C) (04/23 1000) Pulse Rate:  [59-108] 78 (04/23 1000) Resp:  [13-18] 16 (04/23 1000) BP: (83-119)/(44-74) 107/66 (04/23 1000) SpO2:  [99 %-100 %] 100 % (04/23 1000) FiO2 (%):  [40 %] 40 % (04/23 1000) Weight:  [107.3 kg (236 lb 8.9 oz)] 107.3 kg (236 lb 8.9 oz) (04/23 0700) Last BM Date: 09/25/16 Filed Weights   09/24/16 0253 09/25/16 0300 09/26/16 0700  Weight: 109.6 kg (241 lb 10 oz) 107.1 kg (236 lb 1.8 oz) 107.3 kg (236 lb 8.9 oz)   General: intubated, agitated, not responding to voice   Heart: RRR Chest: clear bil.  ETT/vent in place Abdomen: protuberant, active BS.  Non-tender.  Penile edema.  Watery stool in flexiseal bag, black sediment at the bottom.  No redness  Extremities: + LE edema.   Neuro/Psych:  Agitated, not responsive.   Intake/Output from previous day: 04/22 0701 - 04/23 0700 In: 3989.2 [I.V.:1711.7; IV Piggyback:977.5] Out: 5455 [Urine:3230; Stool:2225]  Intake/Output this shift: Total I/O In: 147.7 [I.V.:147.7] Out: 880 [Urine:880]  Lab Results:  Recent Labs  09/24/16 0450 09/25/16 0338 09/26/16 0412  WBC 7.5 4.7 6.9  HGB 8.8* 7.9* 8.9*  HCT 26.1* 23.8* 26.7*  PLT 109* 79* 93*   BMET  Recent Labs  09/24/16 0450 09/25/16 0338 09/26/16 0412  NA 138 141 142  K 4.2 3.3* 3.2*  CL 108 112* 111  CO2 GLUCOSE 149* 114* 114*  BUN 36* 37* 28*  CREATININE 0.67 0.76 0.71  CALCIUM 7.1* 7.3* 7.5*   LFT  Recent Labs  09/24/16 0450  PROT 5.4*  ALBUMIN 2.1*  AST 670*    ALT 277*  ALKPHOS 61  BILITOT 7.4*  BILIDIR 3.5*  IBILI 3.9*   PT/INR  Recent Labs  09/24/16 0450  LABPROT 23.6*  INR 2.07    ASSESMENT:   *  UGIB in cirrhoitic with previous hx GI bleeds, EGDs and banding of esophageal varices dating 12/2015 - 07/2016 at  [facility].   EGD 4/18: grade 3 esophageal varices with active bleeding.  Banded x 4.    Completed 72 hours octreotide and Protonix.    *  ABL anemia.  s/p PRBC x 5.  Last on 4/19.    *  ETOH cirrhosis.  Continued to drink until arrested for DUI 4/12, [facility] 4[facility].   *  Ischemic and ETOH hepatic injury, elevated LFTs.  *  Coagulopathy.  s/p FFP x 3 on 4/18.   Thrombocytopenia.    *  Ascites per CT.    *  AMS.  Intubated, sedated due to agitation     PLAN   *  CMET, CBC, Lipase, coags in AM.    *  Ok to start tube feding via OGT, defer decision to CCM.      Jennye Moccasin  09/26/2016, 10:59 AM Pager: [phone]    [facility] Attending   I have taken an interval history,  reviewed the chart and examined the patient. I agree with the Advanced Practitioner's note, impression and recommendations.    Remains critically ill - but overall better. Manage lungs per CCM  [place] supportive GI care after banding of varices.  Iva Boop, MD, [facility] Gastroenterology [phone] (pager) [phone] after 5 PM, weekends and holidays  09/26/2016 9:45 PM

## 2016-09-27 ENCOUNTER — Inpatient Hospital Stay (HOSPITAL_COMMUNITY): Payer: PRIVATE HEALTH INSURANCE

## 2016-09-27 DIAGNOSIS — R601 Generalized edema: Secondary | ICD-10-CM

## 2016-09-27 LAB — COMPREHENSIVE METABOLIC PANEL
ALT: 154 U/L — ABNORMAL HIGH (ref 17–63)
ANION GAP: 7 (ref 5–15)
AST: 191 U/L — ABNORMAL HIGH (ref 15–41)
Albumin: 2 g/dL — ABNORMAL LOW (ref 3.5–5.0)
Alkaline Phosphatase: 65 U/L (ref 38–126)
BILIRUBIN TOTAL: 7.5 mg/dL — AB (ref 0.3–1.2)
BUN: 19 mg/dL (ref 6–20)
CO2: 24 mmol/L (ref 22–32)
Calcium: 7.7 mg/dL — ABNORMAL LOW (ref 8.9–10.3)
Chloride: 113 mmol/L — ABNORMAL HIGH (ref 101–111)
Creatinine, Ser: 0.61 mg/dL (ref 0.61–1.24)
GFR calc Af Amer: >60 mL/min (ref >60)
GFR calc non Af Amer: >60 mL/min (ref >60)
GLUCOSE: 115 mg/dL — AB (ref 65–99)
POTASSIUM: 3.3 mmol/L — AB (ref 3.5–5.1)
Sodium: 144 mmol/L (ref 135–145)
Total Protein: 5.5 g/dL — ABNORMAL LOW (ref 6.5–8.1)

## 2016-09-27 LAB — MAGNESIUM: Magnesium: 2.2 mg/dL (ref 1.7–2.4)

## 2016-09-27 LAB — BLOOD GAS, ARTERIAL
Acid-Base Excess: 0.2 mmol/L (ref 0.0–2.0)
Bicarbonate: 23.7 mmol/L (ref 20.0–28.0)
DRAWN BY: 39898
FIO2: 40
O2 SAT: 96.5 %
PATIENT TEMPERATURE: 98.6
PCO2 ART: 34.6 mmHg (ref 32.0–48.0)
PEEP: 5 cmH2O
PH ART: 7.451 — AB (ref 7.350–7.450)
RATE: 16 resp/min
VT: 620 mL
pO2, Arterial: 82 mmHg — ABNORMAL LOW (ref 83.0–108.0)

## 2016-09-27 LAB — CBC
HEMATOCRIT: 26.5 % — AB (ref 39.0–52.0)
Hemoglobin: 8.6 g/dL — ABNORMAL LOW (ref 13.0–17.0)
MCH: 30.7 pg (ref 26.0–34.0)
MCHC: 32.5 g/dL (ref 30.0–36.0)
MCV: 94.6 fL (ref 78.0–100.0)
Platelets: 103 10*3/uL — ABNORMAL LOW (ref 150–400)
RBC: 2.8 MIL/uL — ABNORMAL LOW (ref 4.22–5.81)
RDW: 20.5 % — AB (ref 11.5–15.5)
WBC: 8.7 10*3/uL (ref 4.0–10.5)

## 2016-09-27 LAB — GLUCOSE, CAPILLARY
GLUCOSE-CAPILLARY: 110 mg/dL — AB (ref 65–99)
GLUCOSE-CAPILLARY: 130 mg/dL — AB (ref 65–99)
GLUCOSE-CAPILLARY: 132 mg/dL — AB (ref 65–99)
Glucose-Capillary: 116 mg/dL — ABNORMAL HIGH (ref 65–99)
Glucose-Capillary: 116 mg/dL — ABNORMAL HIGH (ref 65–99)
Glucose-Capillary: 117 mg/dL — ABNORMAL HIGH (ref 65–99)
Glucose-Capillary: 123 mg/dL — ABNORMAL HIGH (ref 65–99)
Glucose-Capillary: 139 mg/dL — ABNORMAL HIGH (ref 65–99)

## 2016-09-27 LAB — PHOSPHORUS: Phosphorus: 3.2 mg/dL (ref 2.5–4.6)

## 2016-09-27 LAB — PROTIME-INR
INR: 2.17
Prothrombin Time: 24.5 seconds — ABNORMAL HIGH (ref 11.4–15.2)

## 2016-09-27 LAB — LIPASE, BLOOD: LIPASE: 57 U/L — AB (ref 11–51)

## 2016-09-27 MED ORDER — FUROSEMIDE 40 MG PO TABS: 40.0000 mg | ORAL_TABLET | Freq: Every day | ORAL | Status: DC

## 2016-09-27 MED ORDER — POTASSIUM CHLORIDE 2 MEQ/ML IV SOLN
30.0000 meq | Freq: Once | INTRAVENOUS | Status: AC
  Administered 2016-09-27: 30 meq via INTRAVENOUS
  Filled 2016-09-27: qty 15

## 2016-09-27 MED ORDER — VITAMIN B-1 100 MG PO TABS
100.0000 mg | ORAL_TABLET | Freq: Every day | ORAL | Status: DC
  Administered 2016-09-27: 100 mg via ORAL
  Filled 2016-09-27: qty 1

## 2016-09-27 MED ORDER — LACTULOSE 10 GM/15ML PO SOLN
20.0000 g | Freq: Three times a day (TID) | ORAL | Status: DC
  Administered 2016-09-27 – 2016-10-05 (×25): 20 g via ORAL
  Filled 2016-09-27 (×24): qty 30

## 2016-09-27 MED ORDER — SODIUM CHLORIDE 0.9 % IV SOLN
30.0000 meq | Freq: Once | INTRAVENOUS | Status: AC
  Administered 2016-09-27: 30 meq via INTRAVENOUS
  Filled 2016-09-27: qty 15

## 2016-09-27 MED ORDER — FOLIC ACID 1 MG PO TABS: 1.0000 mg | ORAL_TABLET | Freq: Every day | ORAL | Status: DC

## 2016-09-27 MED ORDER — FUROSEMIDE 10 MG/ML IJ SOLN
40.0000 mg | Freq: Four times a day (QID) | INTRAMUSCULAR | Status: AC
  Administered 2016-09-27 (×3): 40 mg via INTRAVENOUS
  Filled 2016-09-27 (×3): qty 4

## 2016-09-27 MED ORDER — FOLIC ACID 1 MG PO TABS
1.0000 mg | ORAL_TABLET | Freq: Every day | ORAL | Status: DC
  Administered 2016-09-27: 1 mg via ORAL
  Filled 2016-09-27: qty 1

## 2016-09-27 MED ORDER — SPIRONOLACTONE 25 MG PO TABS
100.0000 mg | ORAL_TABLET | Freq: Every day | ORAL | Status: DC
  Administered 2016-09-27 – 2016-09-29 (×3): 100 mg via ORAL
  Filled 2016-09-27 (×3): qty 4

## 2016-09-27 MED ORDER — RIFAXIMIN 550 MG PO TABS
550.0000 mg | ORAL_TABLET | Freq: Two times a day (BID) | ORAL | Status: DC
  Administered 2016-09-27 – 2016-10-13 (×31): 550 mg via ORAL
  Filled 2016-09-27 (×33): qty 1

## 2016-09-27 NOTE — Progress Notes (Addendum)
[facility] ADULT ICU REPLACEMENT PROTOCOL FOR AM LAB REPLACEMENT ONLY  The patient does apply for the [facility] Adult ICU Electrolyte Replacment Protocol based on the criteria listed below:   1. Is GFR >/= 40 ml/min? Yes.    [name] GFR today is >60 . Is urine output >/= 0.5 ml/kg/hr for the last 6 hours? Yes.   [name] UOP is 0.5 ml/kg/hr 3. Is BUN < 60 mg/dL? Yes.    [name] BUN today is 19 4. Abnormal electrolyte(s): K+3.3 5. Ordered repletion with: Protocol 6. If a panic level lab has been reported, has the CCM MD in charge been notified? No..   Physician:  Adrian Blackwater 09/27/2016 5:55 AM

## 2016-09-27 NOTE — Progress Notes (Signed)
Daily Rounding Note  09/27/2016, 10:39 AM  LOS: 6 days   SUBJECTIVE:   Chief complaint: non melenic and non-bloody stool in Flexiseal.      Extubated without incidence this AM.  Precedex, Levophed drips remain in place.  Fentanyl d/cd.    OBJECTIVE:         Vital signs in last 24 hours:    Temp:  [97.5 F (36.4 C)-99.7 F (37.6 C)] 99 F (37.2 C) (04/24 0715) Pulse Rate:  [64-107] 80 (04/24 0807) Resp:  [12-21] 16 (04/24 0807) BP: (76-127)/(45-79) 76/45 (04/24 0807) SpO2:  [94 %-100 %] 100 % (04/24 0943) FiO2 (%):  [40 %] 40 % (04/24 0943) Weight:  [103.5 kg (228 lb 2.8 oz)] 103.5 kg (228 lb 2.8 oz) (04/24 0408) Last BM Date: 09/26/16 Filed Weights   09/25/16 0300 09/26/16 0700 09/27/16 0408  Weight: 107.1 kg (236 lb 1.8 oz) 107.3 kg (236 lb 8.9 oz) 103.5 kg (228 lb 2.8 oz)   General: alert, confused, sitting quietly   Mouth: tongue dry, surface with flaking tissue, no blood Heart: RRR Chest: clear bil.  No labored breathing Abdomen: soft, somewhat distended.  Active BS, NT.   ple brown water with brown stool sediment in flexiseal bag.   Extremities: PAS hose and ulcer prevention booties in place.  Non pitting pedal edema.  Neuro/Psych:  Oriented to hospital, self, birthdate.  Not to year or situation/reason for admission or recent events  Intake/Output from previous day: 04/23 0701 - 04/24 0700 In: 1982.2 [I.V.:1717.2; IV Piggyback:265] Out: 3065 [Urine:3065]   Lab Results:  Recent Labs  09/25/16 0338 09/26/16 0412 09/27/16 0430  WBC 4.7 6.9 8.7  HGB 7.9* 8.9* 8.6*  HCT 23.8* 26.7* 26.5*  PLT 79* 93* 103*   BMET  Recent Labs  09/26/16 0412 09/26/16 1752 09/27/16 0430  NA 142 143 144  K 3.2* 3.0* 3.3*  CL 111 114* 113*  CO2 22 21* 24  GLUCOSE 114* 120* 115*  BUN 28* 22* 19  CREATININE 0.71 0.73 0.61  CALCIUM 7.5* 7.3* 7.7*      ASSESMENT:   *  UGIB in cirrhotic with previous hx GI  bleeds, EGDs and banding of esophageal varices dating 12/2015 - 07/2016 at  [facility].   EGD 4/18: grade 3 esophageal varices with active bleeding.  Banded x 4.    Completed 72 hours octreotide and Protonix.  On BID IV Protonix.   *  ABL anemia.  s/p PRBC x 5.  Last on 4/19.  Hgb relatively stable.   Thrombocytopenia, non-critical, improved.   *  ETOH cirrhosis.  Continued to drink until arrested for DUI 4/12, [facility].   *  Ischemic and ETOH hepatic injury, elevated LFTs are improving.  *  Coagulopathy.  s/p FFP x 3 on 4/18.   Thrombocytopenia.    *  Ascites per CT.  On Lasix 40, aldactone 100 mg daily PTA.    *  AMS.  Intubated, sedated due to agitation.  Minor elevation Ammonia.       *  Resp failure.  Remain on vent.  CXR today: multi-segment atx, acn not r/o PNA  *  Hypokalemia.  Supplemented per CCM.    *  hypoproteinemia, suspect protein malnutrition.    PLAN   *  Ok to start clears and advance as tolerated.  Switch to oral Lactulose and add Rifaximin.  Switch to oral thiamine and folic acid.  Begin oral lasix/aldactone per outpt doses.  As he has orders for 3 doses of IV lasix today, will start po tomorrow.  Need to watch potassium level as between IV runs and restarting aldactone, he could flip to hyperkalemia.  CBC, BMET in AM.     Jennye Moccasin  09/27/2016, 10:39 AM Pager: [phone]    [facility] Attending   I have taken an interval history, reviewed the chart and examined the patient. I agree with the Advanced Practitioner's note, impression and recommendations.   Extubated Fair amount of ascites present - could need a paracentesis - not urgent. Mod encephalopathic vs ICU confusion or both Child's C for sure - ? How much he can improve/recover though too early to tell.  Iva Boop, MD, [facility] [phone] (pager) [phone] after 5 PM, weekends and holidays  09/27/2016 6:40 PM

## 2016-09-27 NOTE — Procedures (Signed)
Extubation Procedure Note  Patient Details:   Name: Ray Hayden DOB: 1965-09-04 MRN: [id number]   Airway Documentation:     Evaluation  O2 sats: stable throughout Complications: No apparent complications Patient did tolerate procedure well. Bilateral Breath Sounds: Clear   Patient extubated to 4 L [place] without complication. Cuff leak noted prior to extubation. No distress noted. Patient able to speak post extubation.  [place] M Sargun Rummell 09/27/2016, 10:46 AM

## 2016-09-27 NOTE — Progress Notes (Signed)
On low dose leveophed, On fent and precedex gtt.   BP 119/74   Pulse 68   Temp 99 F (37.2 C)   Resp 16   Ht 6' (1.829 m)   Wt 228 lb 2.8 oz (103.5 kg)   SpO2 100%   BMI 30.95 kg/m   I/O last 3 completed shifts: In: 4218.3 [I.V.:2575.8; Other:1000; IV Piggyback:642.5] Out: 1610 [RUEAV:4098; Stool:775]  Vitals reviewed. General: lying in bed, sedated, wakes up and opens eyes, does not follow any commands HEENT: eyes open, PERRL Cardiac: RRR, loud systolic murmur.  Pulm: coarse BS, ETT in place. No wheezing.  Abd: distended abdomen.  Ext: warm and well perfused, 2+ pitting edema.  Neuro: confused, not following any commands, not answering questions.   CMP Latest Ref Rng & Units 09/27/2016 09/26/2016 09/26/2016  Glucose 65 - 99 mg/dL 119(J) 478(G) 956(O)  BUN 6 - 20 mg/dL 19 13(Y) 86(V)  Creatinine 0.61 - 1.24 mg/dL 7.84 6.96 2.95  Sodium 135 - 145 mmol/L 144 143 142  Potassium 3.5 - 5.1 mmol/L 3.3(L) 3.0(L) 3.2(L)  Chloride 101 - 111 mmol/L 113(H) 114(H) 111  CO2 22 - 32 mmol/L 24 21(L) 22  Calcium 8.9 - 10.3 mg/dL 7.7(L) 7.3(L) 7.5(L)  Total Protein 6.5 - 8.1 g/dL 2.8(U) - -  Total Bilirubin 0.3 - 1.2 mg/dL 7.5(H) - -  Alkaline Phos 38 - 126 U/L 65 - -  AST 15 - 41 U/L 191(H) - -  ALT 17 - 63 U/L 154(H) - -   CBC Latest Ref Rng & Units 09/27/2016 09/26/2016 09/25/2016  WBC 4.0 - 10.5 K/uL 8.7 6.9 4.7  Hemoglobin 13.0 - 17.0 g/dL 1.3(K) 8.9(L) 7.9(L)  Hematocrit 39.0 - 52.0 % 26.5(L) 26.7(L) 23.8(L)  Platelets 150 - 400 K/uL 103(L) 93(L) 79(L)   ABG    Component Value Date/Time   PHART 7.451 (H) 09/27/2016 0415   PCO2ART 34.6 09/27/2016 0415   PO2ART 82.0 (L) 09/27/2016 0415   HCO3 23.7 09/27/2016 0415   TCO2 22 09/22/2016 1500   ACIDBASEDEF 3.0 (H) 09/22/2016 1500   O2SAT 96.5 09/27/2016 0415   Lab Results  Component Value Date   INR 2.17 09/27/2016   INR 2.07 09/24/2016   INR 2.23 09/22/2016   CBG (last 3)   Recent Labs  09/26/16 1148 09/26/16 1618  09/27/16 0400  GLUCAP 110* 114* 110*    Dg Chest Port 1 View  Result Date: 09/27/2016 CLINICAL DATA:  Endotracheal tube EXAM: PORTABLE CHEST 1 VIEW COMPARISON:  Yesterday FINDINGS: Endotracheal tube tip just below the clavicular heads. Left IJ central line with tip at the SVC level. Low volume chest with streaky opacities. Air bronchograms noted at the left base. No Kerley lines, effusion, or pneumothorax. IMPRESSION: 1. Stable positioning of endotracheal tube and central line. 2. Unchanged low volume chest with multi segment atelectasis. Cannot exclude superimposed pneumonia. Electronically Signed   By: Marnee Spring M.D.   On: 09/27/2016 07:30   Dg Chest Port 1 View  Result Date: 09/26/2016 CLINICAL DATA:  Intubation. EXAM: PORTABLE CHEST 1 VIEW COMPARISON:  09/25/2016 . FINDINGS: Endotracheal tube and left IJ line stable position. Heart size stable. Persistent left base consolidation and left pleural effusion. Low lung volumes with basilar atelectasis IMPRESSION: 1. Lines and tubes in stable position. 2. Persistent left lower lobe consolidation and left pleural effusion. Low lung volumes with basilar atelectasis. Chest is unchanged from prior exam. Electronically Signed   By: Maisie Fus  Register   On: 09/26/2016 07:43  Assessment/plan:  Acute respiratory failure with compromised airway L small pleur eff Possible asp PNA - finished 5 days of zosyn. - Cont lasix.  - Try extubating today - Monitor fever curves.   Acute blood loss anemia with hemorrhagic shock from variceal bleeding. Thrombocytopenia. Stable for now - trend CBC  Upper GI bleeding from esophageal varices s/p banding. ETOH with cirrhosis  Significant ascites. - protonix BID - thiamine, folic acid - diuresis with lasix  HypoK+ and Hypophos - repleting  Acute encephalopathy with hx of alcohol abuse in the setting of shock ICU delirium  - RASS goal 0 to -1,  cont lactulose enema, precedex, fent, prn versed.   Switch to lactulose PO when extubated.   Hyacinth Meeker, M.D.   Attending Note:  51 year old male with etoh abuse presenting with a variceal bleeding that was banded via GI.  Was intubated and had frequent agitation resulting in him not being able to extubate.  Patient was given versed overnight for agitation but seems to be improved this AM.  On exam, lungs are clear with 3+ edema diffusely.  I reviewed CXR myself with ETT in good position.  Will place on 5/5 and advised patient to stay calm.  Continue precedex and likely extubate him on precedex.  Lasix 40 mg IV q6 x3 doses with preemptive K replacement.  Updated family bedside.  Will re-evaluate for extubation in 30 minutes.  The patient is critically ill with multiple organ systems failure and requires high complexity decision making for assessment and support, frequent evaluation and titration of therapies, application of advanced monitoring technologies and extensive interpretation of multiple databases.   Critical Care Time devoted to patient care services described in this note is  35  Minutes. This time reflects time of care of this signee Dr Koren Bound. This critical care time does not reflect procedure time, or teaching time or supervisory time of PA/NP/Med student/Med Resident etc but could involve care discussion time.  Alyson Reedy, M.D. [facility] Pulmonary/Critical Care Medicine. Pager: [phone]. After hours pager: [phone].

## 2016-09-27 NOTE — Evaluation (Signed)
Clinical/Bedside Swallow Evaluation Patient Details  Name: Ray Hayden MRN: [id number] Date of Birth: Aug 05, 1965  Today's Date: 09/27/2016 Time: SLP Start Time (ACUTE ONLY): 1548 SLP Stop Time (ACUTE ONLY): 1618 SLP Time Calculation (min) (ACUTE ONLY): 30 min  Past Medical History:  Past Medical History:  Diagnosis Date  . Cirrhosis (HCC)   . Hypertension    Past Surgical History:  Past Surgical History:  Procedure Laterality Date  . ESOPHAGOGASTRODUODENOSCOPY N/A 09/21/2016   Procedure: ESOPHAGOGASTRODUODENOSCOPY (EGD);  Surgeon: Sherrilyn Rist, MD;  Location: [facility] ENDOSCOPY;  Service: Endoscopy;  Laterality: N/A;   HPI:  51 yo male with hx of ETOH with cirrhosis with ascites and varices presented with vomiting blood.  He required intubation for airway protection.  He was hypotensive in ER   Assessment / Plan / Recommendation Clinical Impression  Pt presents with mild oropharyngeal dysphagia s/p 6 day intubation with extubation this am. Swallow function impacted by altered mental status as impulsivity with thin liquids evidenced and resulted in intermittent cough. Further trials of thin liquids by cup and straw sip without overt s/sx of aspiration though multiple swallows evidenced with suspected delay in swallow initiation. Pt with strong cough. Note prolonged mastication of solid PO with generalized oral weakness. Recommend dysphagia 3 (mechanical soft) and thin liquids with full supervision with all PO and medicines whole in puree. Brother at bedside, educated regarding swallow precautions. ST to follow up for diet tolerance.  SLP Visit Diagnosis: Dysphagia, oropharyngeal phase (R13.12)    Aspiration Risk  Moderate aspiration risk    Diet Recommendation   Dysphagia 3 (mechancial soft) thin liquids  Medication Administration: Whole meds with puree    Other  Recommendations Oral Care Recommendations: Oral care BID   Follow up Recommendations        Frequency and Duration min 1  x/week  1 week       Prognosis Prognosis for Safe Diet Advancement: Good Barriers to Reach Goals: Cognitive deficits;Time post onset      Swallow Study   General Date of Onset: 09/21/16 HPI: 51 yo male with hx of ETOH with cirrhosis with ascites and varices presented with vomiting blood.  He required intubation for airway protection.  He was hypotensive in ER Type of Study: Bedside Swallow Evaluation Previous Swallow Assessment: none on file Diet Prior to this Study: NPO Temperature Spikes Noted: Yes Respiratory Status: Nasal cannula History of Recent Intubation: Yes Length of Intubations (days): 6 days Date extubated: 09/27/16 Behavior/Cognition: Alert Oral Cavity Assessment: Dry Oral Care Completed by SLP: Yes Oral Cavity - Dentition: Adequate natural dentition Self-Feeding Abilities: Needs assist Patient Positioning: Upright in bed Baseline Vocal Quality: Low vocal intensity Volitional Cough: Strong Volitional Swallow: Able to elicit    Oral/Motor/Sensory Function Overall Oral Motor/Sensory Function: Generalized oral weakness Facial ROM: Within Functional Limits   Ice Chips Ice chips: Within functional limits   Thin Liquid Thin Liquid: Impaired Presentation: Cup;Straw Oral Phase Functional Implications: Prolonged oral transit Pharyngeal  Phase Impairments: Suspected delayed Swallow;Multiple swallows;Cough - Delayed;Cough - Immediate    Nectar Thick Nectar Thick Liquid: Not tested   Honey Thick Honey Thick Liquid: Not tested   Puree Puree: Within functional limits   Solid   GO   Solid: Impaired Presentation: Self Fed Oral Phase Functional Implications: Prolonged oral transit Pharyngeal Phase Impairments: Suspected delayed Swallow;Multiple swallows       Marcene Duos MA, CCC-SLP Acute Care Speech Language Pathologist    Kennieth Rad 09/27/2016,4:26 PM

## 2016-09-28 LAB — BASIC METABOLIC PANEL
Anion gap: 8 (ref 5–15)
BUN: 16 mg/dL (ref 6–20)
CALCIUM: 7.8 mg/dL — AB (ref 8.9–10.3)
CO2: 24 mmol/L (ref 22–32)
CREATININE: 0.65 mg/dL (ref 0.61–1.24)
Chloride: 111 mmol/L (ref 101–111)
GFR calc non Af Amer: >60 mL/min (ref >60)
Glucose, Bld: 94 mg/dL (ref 65–99)
Potassium: 3.4 mmol/L — ABNORMAL LOW (ref 3.5–5.1)
Sodium: 143 mmol/L (ref 135–145)

## 2016-09-28 LAB — GLUCOSE, CAPILLARY
GLUCOSE-CAPILLARY: 99 mg/dL (ref 65–99)
GLUCOSE-CAPILLARY: 92 mg/dL (ref 65–99)
Glucose-Capillary: 143 mg/dL — ABNORMAL HIGH (ref 65–99)
Glucose-Capillary: 130 mg/dL — ABNORMAL HIGH (ref 65–99)
Glucose-Capillary: 110 mg/dL — ABNORMAL HIGH (ref 65–99)

## 2016-09-28 LAB — CBC
HCT: 24.7 % — ABNORMAL LOW (ref 39.0–52.0)
Hemoglobin: 8.2 g/dL — ABNORMAL LOW (ref 13.0–17.0)
MCH: 31.3 pg (ref 26.0–34.0)
MCHC: 33.2 g/dL (ref 30.0–36.0)
MCV: 94.3 fL (ref 78.0–100.0)
PLATELETS: 95 10*3/uL — AB (ref 150–400)
RBC: 2.62 MIL/uL — AB (ref 4.22–5.81)
RDW: 20.7 % — AB (ref 11.5–15.5)
WBC: 8.3 10*3/uL (ref 4.0–10.5)

## 2016-09-28 MED ORDER — ADULT MULTIVITAMIN W/MINERALS CH
1.0000 | ORAL_TABLET | Freq: Every day | ORAL | Status: DC
  Administered 2016-09-28 – 2016-10-13 (×15): 1 via ORAL
  Filled 2016-09-28 (×16): qty 1

## 2016-09-28 MED ORDER — PHYTONADIONE 5 MG PO TABS
10.0000 mg | ORAL_TABLET | Freq: Every day | ORAL | Status: AC
  Administered 2016-09-28 – 2016-09-30 (×3): 10 mg via ORAL
  Filled 2016-09-28 (×3): qty 2

## 2016-09-28 MED ORDER — FOLIC ACID 1 MG PO TABS
1.0000 mg | ORAL_TABLET | Freq: Every day | ORAL | Status: DC
  Administered 2016-09-28 – 2016-10-13 (×16): 1 mg via ORAL
  Filled 2016-09-28 (×16): qty 1

## 2016-09-28 MED ORDER — ORAL CARE MOUTH RINSE
15.0000 mL | Freq: Two times a day (BID) | OROMUCOSAL | Status: DC
  Administered 2016-09-28 – 2016-10-13 (×23): 15 mL via OROMUCOSAL

## 2016-09-28 MED ORDER — LORAZEPAM 2 MG/ML IJ SOLN
1.0000 mg | Freq: Four times a day (QID) | INTRAMUSCULAR | Status: DC | PRN
  Administered 2016-09-30 – 2016-10-01 (×3): 1 mg via INTRAVENOUS
  Filled 2016-09-28 (×2): qty 1

## 2016-09-28 MED ORDER — ACETAMINOPHEN 325 MG PO TABS
325.0000 mg | ORAL_TABLET | Freq: Four times a day (QID) | ORAL | Status: DC | PRN
  Administered 2016-09-30 (×2): 325 mg via ORAL
  Filled 2016-09-28 (×2): qty 1

## 2016-09-28 MED ORDER — FUROSEMIDE 10 MG/ML IJ SOLN
40.0000 mg | Freq: Three times a day (TID) | INTRAMUSCULAR | Status: AC
  Administered 2016-09-28 (×2): 40 mg via INTRAVENOUS
  Filled 2016-09-28 (×2): qty 4

## 2016-09-28 MED ORDER — ENSURE ENLIVE PO LIQD
237.0000 mL | Freq: Two times a day (BID) | ORAL | Status: DC
  Administered 2016-09-28 – 2016-10-09 (×9): 237 mL via ORAL

## 2016-09-28 MED ORDER — LORAZEPAM 2 MG/ML IJ SOLN
0.0000 mg | Freq: Four times a day (QID) | INTRAMUSCULAR | Status: AC
  Administered 2016-09-28: 2 mg via INTRAVENOUS
  Administered 2016-09-28: 1 mg via INTRAVENOUS
  Administered 2016-09-29 – 2016-09-30 (×6): 2 mg via INTRAVENOUS
  Filled 2016-09-28 (×8): qty 1

## 2016-09-28 MED ORDER — FUROSEMIDE 40 MG PO TABS: 40.0000 mg | ORAL_TABLET | Freq: Two times a day (BID) | ORAL | Status: DC

## 2016-09-28 MED ORDER — INSULIN ASPART 100 UNIT/ML ~~LOC~~ SOLN
2.0000 [IU] | SUBCUTANEOUS | Status: DC
  Administered 2016-09-28 – 2016-10-03 (×7): 2 [IU] via SUBCUTANEOUS

## 2016-09-28 MED ORDER — LORAZEPAM 1 MG PO TABS: 1.0000 mg | ORAL_TABLET | Freq: Four times a day (QID) | ORAL | Status: DC | PRN

## 2016-09-28 MED ORDER — THIAMINE HCL 100 MG/ML IJ SOLN
100.0000 mg | Freq: Every day | INTRAMUSCULAR | Status: DC
  Filled 2016-09-28: qty 2

## 2016-09-28 MED ORDER — VITAMIN B-1 100 MG PO TABS
100.0000 mg | ORAL_TABLET | Freq: Every day | ORAL | Status: DC
  Administered 2016-09-28 – 2016-10-03 (×6): 100 mg via ORAL
  Filled 2016-09-28 (×5): qty 1

## 2016-09-28 MED ORDER — POTASSIUM CHLORIDE CRYS ER 20 MEQ PO TBCR
20.0000 meq | EXTENDED_RELEASE_TABLET | ORAL | Status: DC
Start: 2016-09-28 — End: 2016-09-28
  Administered 2016-09-28: 20 meq via ORAL
  Filled 2016-09-28: qty 1

## 2016-09-28 MED ORDER — LORAZEPAM 2 MG/ML IJ SOLN
0.0000 mg | Freq: Two times a day (BID) | INTRAMUSCULAR | Status: DC
  Administered 2016-09-30: 2 mg via INTRAVENOUS
  Filled 2016-09-28: qty 1

## 2016-09-28 MED ORDER — POTASSIUM CHLORIDE CRYS ER 20 MEQ PO TBCR
40.0000 meq | EXTENDED_RELEASE_TABLET | Freq: Three times a day (TID) | ORAL | Status: AC
  Administered 2016-09-28 (×2): 40 meq via ORAL
  Filled 2016-09-28 (×2): qty 2

## 2016-09-28 MED ORDER — FUROSEMIDE 10 MG/ML IJ SOLN
40.0000 mg | Freq: Two times a day (BID) | INTRAMUSCULAR | Status: DC
  Administered 2016-09-29 – 2016-10-05 (×13): 40 mg via INTRAVENOUS
  Filled 2016-09-28 (×13): qty 4

## 2016-09-28 MED ORDER — POTASSIUM CHLORIDE 20 MEQ/15ML (10%) PO SOLN: 20.0000 meq | ORAL | Status: DC

## 2016-09-28 NOTE — Progress Notes (Signed)
  Speech Language Pathology Treatment: Dysphagia  Patient Details Name: Ray Hayden MRN: [id number] DOB: [birth year] Today's Date: 09/28/2016 Time: 4098-1191 SLP Time Calculation (min) (ACUTE ONLY): 20 min  Assessment / Plan / Recommendation Clinical Impression  Pt seen at bedside during breakfast for assessment of diet tolerance. Pt was feeding himself. RN present and provided pt with multiple pills, which pt took with sips of water. No difficulty noted with po meds taken with liquid. Pt was observed to take small bites at slow rate during the meal.  Pt appears to be quite confused at this time, but was pleasant and cooperative with SLP and RN. ST will continue to follow for diet tolerance/advancement, and education.   HPI HPI: 51 yo male with hx of ETOH with cirrhosis with ascites and varices presented with vomiting blood.  He required intubation for airway protection.  He was hypotensive in ER      SLP Plan  Continue with current plan of care    Recommendations  Diet recommendations: Dysphagia 3 (mechanical soft);Thin liquid Liquids provided via: Straw;Cup Medication Administration: Whole meds with liquid Supervision: Patient able to self feed;Intermittent supervision to cue for compensatory strategies Compensations: Minimize environmental distractions;Slow rate;Small sips/bites Postural Changes and/or Swallow Maneuvers: Seated upright 90 degrees               Oral Care Recommendations: Oral care BID Follow up Recommendations: None SLP Visit Diagnosis: Dysphagia, oropharyngeal phase (R13.12) Plan: Continue with current plan of care       Celia B. Murvin Natal [facility], CCC-SLP [phone]  Leigh Aurora 09/28/2016, 10:43 AM

## 2016-09-28 NOTE — Progress Notes (Signed)
[facility] ADULT ICU REPLACEMENT PROTOCOL FOR AM LAB REPLACEMENT ONLY  The patient does apply for the [facility] Adult ICU Electrolyte Replacment Protocol based on the criteria listed below:   1. Is GFR >/= 40 ml/min? Yes.    [name] GFR today is >60 2. Is urine output >/= 0.5 ml/kg/hr for the last 6 hours? Yes.   [name] UOP is 1.0 ml/kg/hr 3. Is BUN < 60 mg/dL? Yes.    [name] BUN today is 16 4. Abnormal electrolyte(s):K3.4 5. Ordered repletion with: per protocol 6. If a panic level lab has been reported, has the CCM MD in charge been notified? Yes.  .   Physician:  Robbie Lis, MD  Melrose Nakayama 09/28/2016 6:24 AM

## 2016-09-28 NOTE — Progress Notes (Signed)
Pt admitted to 5w15 via bed from unit 2H.  Pt introduced to unit and routines for this unit. Pt is currently calm and cooperative and oriented to person and place only.  Telesitter is in use. Pt skin is intact.  Pt was given call bell and explained  how to use and phone is near pt. Pt was given info about tele sitter and he responded with "throw that thing out". Bed alarm is also on.

## 2016-09-28 NOTE — Progress Notes (Signed)
Nutrition Follow Up  DOCUMENTATION CODES:   Obesity unspecified  INTERVENTION:    Ensure Enlive po BID, each supplement provides 350 kcal and 20 grams of protein  NEW NUTRITION DIAGNOSIS:   Increased nutrient needs related to acute illness, chronic illness as evidenced by estimated needs, ongoing  GOAL:   Patient will meet greater than or equal to 90% of their needs, progressing  MONITOR:   PO intake, Supplement acceptance, Labs, Skin, I & O's  ASSESSMENT:   51 y.o. Male with PMH of ETOH cirrhosis.  Ascites, paracentesis 05/2016 x 3 (2.6, ?? , 4.6 liters, no SBP). ETOH hepatitis.  Hep B immune.   Depression.  Blood loss anemia/transfusion 04/2016.  Thrombocytopenia. OSA.  s/p appendectomy.  s/p hernia repair.  Right lung nodule per CT 05/2015.  Negative cardiac stress test in 06/2013.     Pt s/p procedure 4/18: EGD with VARICEAL BANDING  Pt with hx of ETOH, cirrhosis with ascites presented with vomiting blood.   Pt extubated 4/24.  S/p bedside swallow evaluation.  Advanced to Dys 3-thin liquids. No % PO intake records available, however, would benefit from addition of oral nutrition supplements. Continues on CIWA Protocol.  Receiving thiamine, folic acid and MVI daily. Labs reviewed.  Potassium 3.4 (L). CBG's [id number].  Diet Order:  DIET DYS 3 Room service appropriate? Yes; Fluid consistency: Thin  Skin:  Reviewed, no issues  Last BM:  4/24 flexiseal  Height:   Ht Readings from Last 1 Encounters:  09/21/16 6' (1.829 m)   Weight:   Wt Readings from Last 1 Encounters:  09/28/16 230 lb 9.6 oz (104.6 kg)   Ideal Body Weight:  81 kg  BMI:  Body mass index is 31.28 kg/m.  Estimated Nutritional Needs:   Kcal:  2300-2500  Protein:  125-135 gm  Fluid:  2.3-2.5 L  EDUCATION NEEDS:   No education needs identified at this time  Maureen Chatters, RD, LDN Pager #: [phone] After-Hours Pager #: [phone]

## 2016-09-28 NOTE — Progress Notes (Signed)
Extubated yesterday, off levo, remains on precedex gtt. Talking, drinking water now, has complaints about not always getting water.   BP 104/71   Pulse 75   Temp 97.9 F (36.6 C) (Axillary)   Resp (!) 24   Ht 6' (1.829 m)   Wt 230 lb 9.6 oz (104.6 kg)   SpO2 100%   BMI 31.28 kg/m   I/O last 3 completed shifts: In: 2601 [P.O.:440; I.V.:1896; IV Piggyback:265] Out: 5120 [Urine:4320; Stool:800]  Vitals reviewed. General: lying in bed,  HEENT: [place], at, t Cardiac: RRR, loud systolic murmur.  Pulm: coarse BS, ETT in place. No wheezing.  Abd: distended abdomen.  Ext: warm and well perfused, 2+ pitting edema.  Neuro: talking.  CMP Latest Ref Rng & Units 09/28/2016 09/27/2016 09/26/2016  Glucose 65 - 99 mg/dL 94 161(W) 960(A)  BUN 6 - 20 mg/dL 16 19 54(U)  Creatinine 0.61 - 1.24 mg/dL 9.81 1.91 4.78  Sodium 135 - 145 mmol/L 143 144 143  Potassium 3.5 - 5.1 mmol/L 3.4(L) 3.3(L) 3.0(L)  Chloride 101 - 111 mmol/L 111 113(H) 114(H)  CO2 22 - 32 mmol/L 24 24 21(L)  Calcium 8.9 - 10.3 mg/dL 7.8(L) 7.7(L) 7.3(L)  Total Protein 6.5 - 8.1 g/dL - 5.5(L) -  Total Bilirubin 0.3 - 1.2 mg/dL - 7.5(H) -  Alkaline Phos 38 - 126 U/L - 65 -  AST 15 - 41 U/L - 191(H) -  ALT 17 - 63 U/L - 154(H) -   CBC Latest Ref Rng & Units 09/28/2016 09/27/2016 09/26/2016  WBC 4.0 - 10.5 K/uL 8.3 8.7 6.9  Hemoglobin 13.0 - 17.0 g/dL 8.2(L) 8.6(L) 8.9(L)  Hematocrit 39.0 - 52.0 % 24.7(L) 26.5(L) 26.7(L)  Platelets 150 - 400 K/uL 95(L) 103(L) 93(L)   ABG    Component Value Date/Time   PHART 7.451 (H) 09/27/2016 0415   PCO2ART 34.6 09/27/2016 0415   PO2ART 82.0 (L) 09/27/2016 0415   HCO3 23.7 09/27/2016 0415   TCO2 22 09/22/2016 1500   ACIDBASEDEF 3.0 (H) 09/22/2016 1500   O2SAT 96.5 09/27/2016 0415   Lab Results  Component Value Date   INR 2.17 09/27/2016   INR 2.07 09/24/2016   INR 2.23 09/22/2016   CBG (last 3)   Recent Labs  09/27/16 2022 09/27/16 2333 09/28/16 0329  GLUCAP 130* 132* 99     Dg Chest Port 1 View  Result Date: 09/27/2016 CLINICAL DATA:  Endotracheal tube EXAM: PORTABLE CHEST 1 VIEW COMPARISON:  Yesterday FINDINGS: Endotracheal tube tip just below the clavicular heads. Left IJ central line with tip at the SVC level. Low volume chest with streaky opacities. Air bronchograms noted at the left base. No Kerley lines, effusion, or pneumothorax. IMPRESSION: 1. Stable positioning of endotracheal tube and central line. 2. Unchanged low volume chest with multi segment atelectasis. Cannot exclude superimposed pneumonia. Electronically Signed   By: Marnee Spring M.D.   On: 09/27/2016 07:30   Assessment/plan:  Acute respiratory failure with compromised airway L small pleur eff Possible asp PNA - finished 5 days of zosyn. Extubated 4/24 -1.9L with IV lasix 40 x3 yesterday. Will do lasix  bid PO today. - Monitor fever curves.   Acute blood loss anemia with hemorrhagic shock from variceal bleeding. Thrombocytopenia. Stable for now - trend CBC  Upper GI bleeding from esophageal varices s/p banding. ETOH with cirrhosis  Significant ascites. - protonix BID - thiamine, folic acid - diuresis with lasix  HypoK+ and Hypophos - repleting  Acute encephalopathy with hx of alcohol  abuse in the setting of shock ICU delirium  - RASS goal 0 to -1,  cont lactulose PO Try to wean precedex off today, keep PRN versed.  Likely transfer out today.  Hyacinth Meeker, M.D.   Attending Note:  51 year old alcoholic male with cirrhosis who presents with UGI bleed due to varices that was banded.  Patient was intubated for withdrawal and EGD that is now much improved.  Patient remains somewhat confused but lungs are clear.  Remained on precedex this AM but now off.  I reviewed CXR myself, no acute abnormalities noted.  Discussed with TRH-MD and PCCM-resident.  Hepatic encephalopathy:  - Lactulose  - No need for further ammonia levels.  - D/C precedex.  Cirrhosis: has  ascites  - May need para but not now  Alcoholism:  - Thiamine  - Folate  - CIWA with IV ativan  Anasarca:  - Lasix 40 mg PO daily  - Additional lasix 40 mg IV q8 x2 doses  - K-dur  - BMET in AM  - Aldactone  Transfer to tele and to [facility] service with PCCM off 4/26.  Patient seen and examined, agree with above note.  I dictated the care and orders written for this patient under my direction.  Alyson Reedy, MD [phone]

## 2016-09-28 NOTE — Progress Notes (Signed)
Daily Rounding Note  09/28/2016, 11:06 AM  LOS: 7 days   SUBJECTIVE:   Chief complaint: belly uncomfortable due to swellling.  Previous paracentesis at [facility].  Tolerating regular diet.     Extubated 4/24. Off Levo,off Fentanyl, Precedex dc'd this AM.     OBJECTIVE:         Vital signs in last 24 hours:    Temp:  [90.3 F (32.4 C)-100.2 F (37.9 C)] 97.9 F (36.6 C) (04/25 0700) Pulse Rate:  [74-123] 117 (04/25 1000) Resp:  [15-39] 39 (04/25 1000) BP: (92-139)/(53-91) 115/86 (04/25 1000) SpO2:  [90 %-100 %] 90 % (04/25 1000) Weight:  [104.6 kg (230 lb 9.6 oz)] 104.6 kg (230 lb 9.6 oz) (04/25 0331) Last BM Date: 09/27/16 (flexiseal) Filed Weights   09/26/16 0700 09/27/16 0408 09/28/16 0331  Weight: 107.3 kg (236 lb 8.9 oz) 103.5 kg (228 lb 2.8 oz) 104.6 kg (230 lb 9.6 oz)   General: alert, not ill looking.   Heart: RRR Chest: clear bil.  No labored breathing.   Abdomen: tense, distended.  Minimally tender.  BS muffled Rectal: black liquid stool (no maroon or bloody cast) in flexiseal   Extremities: LE/pedal edema.   Neuro/Psych:  Cooperative,   Intake/Output from previous day: 04/24 0701 - 04/25 0700 In: 1607.4 [P.O.:440; I.V.:1167.4] Out: 1610 [Urine:2785; Stool:800]  Intake/Output this shift: Total I/O In: 791.8 [P.O.:720; I.V.:71.8] Out: 110 [Urine:110]  Lab Results:  Recent Labs  09/26/16 0412 09/27/16 0430 09/28/16 0335  WBC 6.9 8.7 8.3  HGB 8.9* 8.6* 8.2*  HCT 26.7* 26.5* 24.7*  PLT 93* 103* 95*   BMET  Recent Labs  09/26/16 1752 09/27/16 0430 09/28/16 0335  NA 143 144 143  K 3.0* 3.3* 3.4*  CL 114* 113* 111  CO2 21* 24 24  GLUCOSE 120* 115* 94  BUN 22* 19 16  CREATININE 0.73 0.61 0.65  CALCIUM 7.3* 7.7* 7.8*   LFT  Recent Labs  09/27/16 0430  PROT 5.5*  ALBUMIN 2.0*  AST 191*  ALT 154*  ALKPHOS 65  BILITOT 7.5*   PT/INR  Recent Labs  09/27/16 0430  LABPROT 24.5*   INR 2.17   Hepatitis Panel No results for input(s): HEPBSAG, HCVAB, HEPAIGM, HEPBIGM in the last 72 hours.  Studies/Results: Dg Chest Port 1 View  Result Date: 09/27/2016 CLINICAL DATA:  Endotracheal tube EXAM: PORTABLE CHEST 1 VIEW COMPARISON:  Yesterday FINDINGS: Endotracheal tube tip just below the clavicular heads. Left IJ central line with tip at the SVC level. Low volume chest with streaky opacities. Air bronchograms noted at the left base. No Kerley lines, effusion, or pneumothorax. IMPRESSION: 1. Stable positioning of endotracheal tube and central line. 2. Unchanged low volume chest with multi segment atelectasis. Cannot exclude superimposed pneumonia. Electronically Signed   By: Marnee Spring M.D.   On: 09/27/2016 07:30   Scheduled Meds: . Chlorhexidine Gluconate Cloth  6 each Topical Daily  . folic acid  1 mg Oral Daily  . furosemide  40 mg Intravenous Q8H  . insulin aspart  2-6 Units Subcutaneous Q4H  . lactulose  20 g Oral TID  . LORazepam  0-4 mg Intravenous Q6H   Followed by  . [START ON 09/30/2016] LORazepam  0-4 mg Intravenous Q12H  . mouth rinse  15 mL Mouth Rinse BID  . multivitamin with minerals  1 tablet Oral Daily  . pantoprazole  40 mg Intravenous Q12H  . potassium chloride  40 mEq  Oral TID  . rifaximin  550 mg Oral BID  . sodium chloride flush  10-40 mL Intracatheter Q12H  . sodium chloride flush  3 mL Intravenous Q12H  . spironolactone  100 mg Oral Daily  . thiamine  100 mg Oral Daily   Or  . thiamine  100 mg Intravenous Daily   Continuous Infusions: . sodium chloride Stopped (09/28/16 0900)   PRN Meds:.LORazepam **OR** LORazepam, sodium chloride flush, sodium chloride flush   ASSESMENT:   * UGIB in cirrhotic with previous hx GI bleeds, EGDs and banding of esophageal varices dating 12/2015 - 07/2016 at [facility].  EGD 4/18: grade 3 esophageal varices with active bleeding. Banded x 4.  Completed 72 hours octreotide and Protonix. On BID  IV Protonix.   * ABL anemia. s/p PRBC x 5. Last on 4/19. Hgb relatively stable.   Thrombocytopenia, non-critical.     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  Transaminitis, due to ETOH hepatitis and shock liver, are improved   *  ETOH dependence.  Continued to drink until arrested for DUI 4/12, [facility].   * Coagulopathy. s/p FFP x 3 on 4/18.  Persists.  Thrombocytopenia.   * Ascites, Anasarca. On Lasix 40, aldactone 100 mg daily PTA.   Started back Aldactone 4/24.  Today receiving IV Lasix 40 mg q 8 hours x 2 doses.  No compromise in renal fx.  Previous paracentesis x 3 at [facility]: no SBP   * AMS.  Minor elevation Ammonia.  On oral lactulose and Rifaximin.  Remains confused.      *  Resp failure.  Remain on vent.  CXR today: multi-segment atx, acn not r/o PNA  *  Hypokalemia.    *  hypoproteinemia, suspect protein malnutrition.  Eating well and drinking Ensure supplements.   PLAN   *  3 days of po Vitamin K.  Check coags in 2 days.  Follow BMET.  Remove Flexiseal.   *  ? Paracentesis tomorrow?   *  PO lasix tomorrow.  Start with 40 mg BID.      Jennye Moccasin  09/28/2016, 11:06 AM Pager: [phone]  Agree with Ms. Heron Nay assessment and plan except IV furosemide while here and would do paracentesis - defer to CCM on ordeing that Iva Boop, MD, [facility]

## 2016-09-28 NOTE — Progress Notes (Signed)
Informed pt's brother, Laine Giovanetti, pt was moved to 5W01.

## 2016-09-29 ENCOUNTER — Inpatient Hospital Stay (HOSPITAL_COMMUNITY): Payer: PRIVATE HEALTH INSURANCE

## 2016-09-29 ENCOUNTER — Encounter (HOSPITAL_COMMUNITY): Payer: Self-pay | Admitting: Student

## 2016-09-29 DIAGNOSIS — K7290 Hepatic failure, unspecified without coma: Secondary | ICD-10-CM

## 2016-09-29 DIAGNOSIS — K922 Gastrointestinal hemorrhage, unspecified: Secondary | ICD-10-CM

## 2016-09-29 HISTORY — PX: IR PARACENTESIS: IMG2679

## 2016-09-29 LAB — CBC
HCT: 27.8 % — ABNORMAL LOW (ref 39.0–52.0)
HEMOGLOBIN: 9.2 g/dL — AB (ref 13.0–17.0)
MCH: 30.9 pg (ref 26.0–34.0)
MCHC: 33.1 g/dL (ref 30.0–36.0)
MCV: 93.3 fL (ref 78.0–100.0)
PLATELETS: 144 10*3/uL — AB (ref 150–400)
RBC: 2.98 MIL/uL — AB (ref 4.22–5.81)
RDW: 20 % — ABNORMAL HIGH (ref 11.5–15.5)
WBC: 10.8 10*3/uL — ABNORMAL HIGH (ref 4.0–10.5)

## 2016-09-29 LAB — BASIC METABOLIC PANEL
ANION GAP: 8 (ref 5–15)
Anion gap: 9 (ref 5–15)
BUN: 13 mg/dL (ref 6–20)
BUN: 11 mg/dL (ref 6–20)
CHLORIDE: 107 mmol/L (ref 101–111)
CO2: 23 mmol/L (ref 22–32)
CO2: 23 mmol/L (ref 22–32)
CREATININE: 0.75 mg/dL (ref 0.61–1.24)
Calcium: 8 mg/dL — ABNORMAL LOW (ref 8.9–10.3)
Calcium: 8 mg/dL — ABNORMAL LOW (ref 8.9–10.3)
Chloride: 108 mmol/L (ref 101–111)
Creatinine, Ser: 0.76 mg/dL (ref 0.61–1.24)
GFR calc Af Amer: >60 mL/min (ref >60)
GLUCOSE: 101 mg/dL — AB (ref 65–99)
Glucose, Bld: 89 mg/dL (ref 65–99)
POTASSIUM: 3.5 mmol/L (ref 3.5–5.1)
Potassium: 3.6 mmol/L (ref 3.5–5.1)
Sodium: 139 mmol/L (ref 135–145)
Sodium: 139 mmol/L (ref 135–145)

## 2016-09-29 LAB — GLUCOSE, CAPILLARY
GLUCOSE-CAPILLARY: 125 mg/dL — AB (ref 65–99)
GLUCOSE-CAPILLARY: 102 mg/dL — AB (ref 65–99)
GLUCOSE-CAPILLARY: 109 mg/dL — AB (ref 65–99)
Glucose-Capillary: 107 mg/dL — ABNORMAL HIGH (ref 65–99)
Glucose-Capillary: 149 mg/dL — ABNORMAL HIGH (ref 65–99)
Glucose-Capillary: 150 mg/dL — ABNORMAL HIGH (ref 65–99)

## 2016-09-29 LAB — PHOSPHORUS: PHOSPHORUS: 3.4 mg/dL (ref 2.5–4.6)

## 2016-09-29 LAB — GRAM STAIN: Gram Stain: NONE SEEN

## 2016-09-29 LAB — BODY FLUID CELL COUNT WITH DIFFERENTIAL
LYMPHS FL: 7 %
MONOCYTE-MACROPHAGE-SEROUS FLUID: 70 % (ref 50–90)
Neutrophil Count, Fluid: 23 % (ref 0–25)
Total Nucleated Cell Count, Fluid: 201 cu mm (ref 0–1000)

## 2016-09-29 LAB — PREALBUMIN: Prealbumin: <5 mg/dL — ABNORMAL LOW (ref 18–38)

## 2016-09-29 LAB — MAGNESIUM: MAGNESIUM: 1.9 mg/dL (ref 1.7–2.4)

## 2016-09-29 MED ORDER — LIDOCAINE HCL 1 % IJ SOLN
INTRAMUSCULAR | Status: AC | PRN
  Administered 2016-09-29: 10 mL

## 2016-09-29 MED ORDER — ALBUMIN HUMAN 25 % IV SOLN
25.0000 g | Freq: Four times a day (QID) | INTRAVENOUS | Status: DC
  Administered 2016-09-29: 25 g via INTRAVENOUS
  Filled 2016-09-29: qty 50

## 2016-09-29 MED ORDER — DEXTROSE 5 % IV SOLN
2.0000 g | INTRAVENOUS | Status: DC
  Administered 2016-09-29 – 2016-10-02 (×4): 2 g via INTRAVENOUS
  Filled 2016-09-29 (×4): qty 2

## 2016-09-29 MED ORDER — LIDOCAINE HCL 1 % IJ SOLN
INTRAMUSCULAR | Status: AC
Start: 2016-09-29 — End: 2016-09-29
  Filled 2016-09-29: qty 20

## 2016-09-29 MED ORDER — ALBUMIN HUMAN 25 % IV SOLN
25.0000 g | Freq: Four times a day (QID) | INTRAVENOUS | Status: AC
  Administered 2016-09-29: 25 g via INTRAVENOUS
  Filled 2016-09-29 (×2): qty 50

## 2016-09-29 MED ORDER — SPIRONOLACTONE 25 MG PO TABS
150.0000 mg | ORAL_TABLET | Freq: Every day | ORAL | Status: DC
  Administered 2016-09-30 – 2016-10-06 (×8): 150 mg via ORAL
  Filled 2016-09-29 (×7): qty 6

## 2016-09-29 MED ORDER — DEXTROSE 5 % IV SOLN
500.0000 mg | INTRAVENOUS | Status: DC
  Administered 2016-09-29: 500 mg via INTRAVENOUS
  Filled 2016-09-29 (×2): qty 500

## 2016-09-29 MED ORDER — LEVOFLOXACIN IN D5W 500 MG/100ML IV SOLN: 500.0000 mg | INTRAVENOUS | Status: DC

## 2016-09-29 NOTE — Progress Notes (Signed)
Returned from [employer], NAD noted.

## 2016-09-29 NOTE — Progress Notes (Signed)
Paracentesis completed, only 2.1 liters removed.  spok with IR PA and she noted it was a challenge to tap this amount.   Despite his significant abdominal protuberance, he does not have that much ascites.    Fluid WBCs 201, not c/w SBP.    Jennye Moccasin PA-C.    Agree May need repeat larger volume paracentesis   Iva Boop, MD, [facility]

## 2016-09-29 NOTE — Progress Notes (Signed)
  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 RN reported pt coughed once using straw and this therapist observed several coughing episode with straw use. He required moderate cues to attend and follow precautions. Frequent brief belching-like spasms observed likely due to ETOH irritating esophagus. Continue Dys 3 texture, thin liquids, added NO straws to pt's recommendations. Pt has low grade fever. Will continue to follow.    HPI HPI: 51 yo male with hx of ETOH with cirrhosis with ascites and varices presented with vomiting blood.  He required intubation for airway protection.  He was hypotensive in ER      SLP Plan  Continue with current plan of care       Recommendations  Diet recommendations: Dysphagia 3 (mechanical soft);Thin liquid Liquids provided via: Cup;No straw Medication Administration: Whole meds with liquid Supervision: Staff to assist with self feeding;Full supervision/cueing for compensatory strategies Compensations: Slow rate;Small sips/bites;Minimize environmental distractions Postural Changes and/or Swallow Maneuvers: Seated upright 90 degrees;Upright 30-60 min after meal                Oral Care Recommendations: Oral care BID Follow up Recommendations:  (TBD) SLP Visit Diagnosis: Dysphagia, oropharyngeal phase (R13.12) Plan: Continue with current plan of care       GO                Royce Macadamia 09/29/2016, 9:47 AM Breck Coons Lonell Face.Ed [employer] [phone]

## 2016-09-29 NOTE — Progress Notes (Addendum)
Daily Rounding Note  09/29/2016, 10:46 AM  LOS: 8 days     ASSESMENT:   * UGIB in cirrhotic with previous hx GI bleeds, EGDs and banding of esophageal varices dating 12/2015 - 07/2016 at [facility].  EGD 4/18: grade 3 esophageal varices with active bleeding. Banded x 4.  Completed 72 hours octreotide and Protonix. On BID IV Protonix.   * ABL anemia. s/p PRBC x 5. Last on 4/19. Hgb stable.  Thrombocytopenia, non-critical, improved.    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  Transaminitis, due to ETOH hepatitis and shock liver, are improved   *  ETOH dependence.  Continued to drink until arrested for DUI 4/12, [facility] 4/[facility].   * Coagulopathy. s/p FFP x 3 on 4/18.  Persists. Day 2/3 of oral Vitamin K. Thrombocytopenia.   * Ascites, Anasarca. On Lasix 40, aldactone 100 mg daily PTA.  Started back Aldactone 4/24.  IV lasix last few days, now on IV Lasix 40 mg BID as of this AM.  Previous paracentesis x 3 at [facility]: no SBP. No paracentesis ordered.    * AMS. Minor elevation Ammonia. On oral lactulose and Rifaximin.  MS better but still confused and mentally/physically slow.   * hypoproteinemia, suspect protein malnutrition.  Eating well and drinking Ensure supplements.    PLAN   *  coags in AM.  Paracentesis with studies to r/o SBP.  Albumin x 2 doses    Ray Hayden  09/29/2016, 10:46 AM Pager: [phone]    [facility] Attending   I have taken an interval history, reviewed the chart and examined the patient. I agree with the Advanced Practitioner's note, impression and recommendations.   Had paracentesis only 2.1 L no SBP  Will recehcek NH3 and coags in AM  Bump spironaloactone to 150 mg  His mental status not coming around quickly  Iva Boop, MD, [facility] [phone] (pager) [phone] after 5 PM, weekends and holidays    09/29/2016 4:39 PM    SUBJECTIVE:   Chief complaint:  I feel like S++tTight abdomen, it is uncomfortable.  Some nausea, no vomiting Smear of stool this AM, dark.       OBJECTIVE:         Vital signs in last 24 hours:    Temp:  [98.4 F (36.9 C)-100.8 F (38.2 C)] 100.3 F (37.9 C) (04/26 0531) Pulse Rate:  [108-140] 119 (04/26 0600) Resp:  [18-38] 30 (04/26 0600) BP: (97-130)/(60-79) 112/71 (04/26 0600) SpO2:  [91 %-99 %] 94 % (04/26 0600) Weight:  [111.6 kg (246 lb)-112.2 kg (247 lb 4.8 oz)] 111.6 kg (246 lb) (04/26 0417) Last BM Date: 09/28/16 Filed Weights   09/28/16 0331 09/28/16 1835 09/29/16 0417  Weight: 104.6 kg (230 lb 9.6 oz) 112.2 kg (247 lb 4.8 oz) 111.6 kg (246 lb)   General: looks ill, mildly somnolent   Heart: regular , tachy Chest: clear  Abdomen: tense, distended, slightly tender.  Muffled BS  Extremities: no CCE Neuro/Psych:  Alert.  Oriented to [facility] hospital and to self.  Not to year.  spychomotor retardation.  Trouble following commands.  No obvious asterixis.   Intake/Output from previous day: 04/25 0701 - 04/26 0700 In: 1271.8 [P.O.:1200; I.V.:71.8] Out: 970 [Urine:970]   Lab Results:  Recent Labs  09/27/16 0430 09/28/16 0335 09/29/16 0858  WBC 8.7 8.3 10.8*  HGB 8.6* 8.2* 9.2*  HCT 26.5* 24.7* 27.8*  PLT 103* 95* 144*  BMET  Recent Labs  09/27/16 0430 09/28/16 0335 09/29/16 0858  NA 144 143 139  K 3.3* 3.4* 3.6  CL 113* 111 107  CO2 GLUCOSE 115* 94 89  BUN CREATININE 0.61 0.65 0.75  CALCIUM 7.7* 7.8* 8.0*   LFT  Recent Labs  09/27/16 0430  PROT 5.5*  ALBUMIN 2.0*  AST 191*  ALT 154*  ALKPHOS 65  BILITOT 7.5*   PT/INR  Recent Labs  09/27/16 0430  LABPROT 24.5*  INR 2.17

## 2016-09-29 NOTE — Progress Notes (Signed)
PT Cancellation Note  Patient Details Name: Ray Hayden MRN: [id number] DOB: 05-Sep-1965   Cancelled Treatment:    Reason Eval/Treat Not Completed: Medical issues which prohibited therapy.  Just returned from procedure, lethargic and has been medicated.  Nsg reported to PT that pt will not be up for this for now.  Try again tomorrow.   Ivar Drape 09/29/2016, 1:01 PM   Samul Dada, PT MS Acute Rehab Dept. Number: [facility] [id number] and [facility] [phone]

## 2016-09-29 NOTE — Progress Notes (Signed)
RN called requesting to make RRT aware of possible SIRS criteria due to abnormal VS. Pt a/o x4, resting comfortably. Advised RN to continue to monitor VS, page MD and RRT for any changes. No interventions from RRT.

## 2016-09-29 NOTE — Progress Notes (Signed)
Rapid Response RN contacted concerning pts vitals and SIRS criteria. Will evaluate patients VS q2h and follow up with RRT

## 2016-09-29 NOTE — Progress Notes (Signed)
To IR via bed, for procedure: sister in law at bedside.

## 2016-09-29 NOTE — Procedures (Signed)
PROCEDURE SUMMARY:  Successful US guided paracentesis from left lateral abdomen.  Yielded 2.1 liters of clear, yellow fluid.  No immediate complications.  Pt tolerated well.   Specimen was sent for labs.  Hoyt Koch PA-C 09/29/2016 12:55 PM

## 2016-09-29 NOTE — Progress Notes (Signed)
PROGRESS NOTE    Ray Hayden  ZOX:[id number] DOB: 1965-07-13 DOA: 09/21/2016 PCP: Marcelo Baldy, MD    Brief Narrative: 51 year old alcoholic male with cirrhosis who presents with UGI bleed due to varices that was banded.  Patient was intubated for withdrawal and EGD. He was extubated on 4/24 and precedex discontinued on 4/25. He was transferred to [facility] on 4/26.   Assessment & Plan:   Active Problems:   GI bleed   LFTs abnormal   Encephalopathy, hepatic (HCC)   Liver disease, chronic, with cirrhosis (HCC)   Respiratory failure (HCC)   Upper GI bleed  in cirrhotic with previous hx GI bleeds,  EGDs and banding of esophageal varices dating 12/2015 - 07/2016 at [facility].  EGD 4/18: grade 3 esophageal varices with active bleeding. Banded x 4.  Completed 72 hours octreotide and Protonix. On BID IV Protonix.  Plan to change to oral Protonix in am.  Currently on dysphagia 3 diet with thin liquids and able to tolerate well.  3 days of vitamin K for coagulopathy   Alcoholic liver disease with cirrhosis and ascites;  US paracentesis will be ordered.  Resume rifaximin and lactulose.  Resume lasix and aldactone.  Remains confused .   Acute hepatic encephalopathy: still confused, resume rifaximin and lactulose.  Trend ammonia levels as needed.    ETOH dependence, with ETOH hepatitis and shock: improving.  Social worker consulted.    Acute respiratory failure possibly from aspiration pneumonia.  Completed 5 day s of zosyn, still febrile, restart antibiotics for another 3 days.    Hypokalemia:  Replaced.    DVT prophylaxis: SCD'S Code Status: full code.  Family Communication: none at beside Disposition Plan: pending PT eval.    Consultants:   Gastroenterology  PCCM.    Procedures:EGD on 4/18   Antimicrobials: zosyn completed 5 days,    Subjective: Reports not feeling good. Feels heavy  Objective: Vitals:   09/29/16 0417 09/29/16 0422 09/29/16  0531 09/29/16 0600  BP:  104/64  112/71  Pulse:  (!) 122  (!) 119  Resp:  (!) 28  (!) 30  Temp:  98.4 F (36.9 C) 100.3 F (37.9 C)   TempSrc:   Rectal   SpO2:  92%  94%  Weight: 111.6 kg (246 lb)     Height:        Intake/Output Summary (Last 24 hours) at 09/29/16 1039 Last data filed at 09/29/16 0900  Gross per 24 hour  Intake              480 ml  Output             2060 ml  Net            -1580 ml   Filed Weights   09/28/16 0331 09/28/16 1835 09/29/16 0417  Weight: 104.6 kg (230 lb 9.6 oz) 112.2 kg (247 lb 4.8 oz) 111.6 kg (246 lb)    Examination:  General exam: Appears calm and comfortable  Respiratory system: diminished at bases, scattered rhonchi, no wheezing.  Cardiovascular system: S1 & S2 heard, RRR.  2 +  pedal edema. Gastrointestinal system: Abdomen is distended, soft and nontender.Normal bowel sounds heard. Central nervous system: Alert , with slow speech. Confused. Able to move extremities .  Skin: No rashes, lesions or ulcers Psychiatry: flat affect, sloe speech.     Data Reviewed: I have personally reviewed following labs and imaging studies  CBC:  Recent Labs Lab 09/25/16 0338 09/26/16 0412 09/27/16  0430 09/28/16 0335 09/29/16 0858  WBC 4.7 6.9 8.7 8.3 10.8*  HGB 7.9* 8.9* 8.6* 8.2* 9.2*  HCT 23.8* 26.7* 26.5* 24.7* 27.8*  MCV 93.0 94.0 94.6 94.3 93.3  PLT 79* 93* 103* 95* 144*   Basic Metabolic Panel:  Recent Labs Lab 09/24/16 0450 09/25/16 0338 09/26/16 0412 09/26/16 1752 09/27/16 0430 09/28/16 0335 09/29/16 0858  NA 138 141 142 143 144 143 139  K 4.2 3.3* 3.2* 3.0* 3.3* 3.4* 3.6  CL 108 112* 111 114* 113* 111 107  CO2 21* GLUCOSE 149* 114* 114* 120* 115* 94 89  BUN 36* 37* 28* 22* CREATININE 0.67 0.76 0.71 0.73 0.61 0.65 0.75  CALCIUM 7.1* 7.3* 7.5* 7.3* 7.7* 7.8* 8.0*  MG 1.9 1.9 1.8  --  2.2  --  1.9  PHOS 2.1* 2.1* 3.6  --  3.2  --  3.4   GFR: Estimated Creatinine Clearance: 140.9 mL/min  (by C-G formula based on SCr of 0.75 mg/dL). Liver Function Tests:  Recent Labs Lab 09/24/16 0450 09/27/16 0430  AST 670* 191*  ALT 277* 154*  ALKPHOS 61 65  BILITOT 7.4* 7.5*  PROT 5.4* 5.5*  ALBUMIN 2.1* 2.0*    Recent Labs Lab 09/27/16 0430  LIPASE 57*    Recent Labs Lab 09/23/16 0420 09/24/16 1217 09/25/16 0337 09/26/16 0432  AMMONIA 97* 88* 47* 45*   Coagulation Profile:  Recent Labs Lab 09/24/16 0450 09/27/16 0430  INR 2.07 2.17   Cardiac Enzymes: No results for input(s): CKTOTAL, CKMB, CKMBINDEX, TROPONINI in the last 168 hours. BNP (last 3 results) No results for input(s): PROBNP in the last 8760 hours. HbA1C: No results for input(s): HGBA1C in the last 72 hours. CBG:  Recent Labs Lab 09/28/16 1552 09/28/16 2107 09/29/16 0049 09/29/16 0418 09/29/16 0758  GLUCAP 130* 110* 149* 125* 102*   Lipid Profile: No results for input(s): CHOL, HDL, LDLCALC, TRIG, CHOLHDL, LDLDIRECT in the last 72 hours. Thyroid Function Tests: No results for input(s): TSH, T4TOTAL, FREET4, T3FREE, THYROIDAB in the last 72 hours. Anemia Panel: No results for input(s): VITAMINB12, FOLATE, FERRITIN, TIBC, IRON, RETICCTPCT in the last 72 hours. Sepsis Labs:  Recent Labs Lab 09/22/16 1206 09/23/16 0420 09/24/16 0450  PROCALCITON 1.87 2.13 1.45  LATICACIDVEN  --   --  1.9    Recent Results (from the past 240 hour(s))  MRSA PCR Screening     Status: None   Collection Time: 09/21/16 12:24 PM  Result Value Ref Range Status   MRSA by PCR NEGATIVE NEGATIVE Final    Comment:        The GeneXpert MRSA Assay (FDA approved for NASAL specimens only), is one component of a comprehensive MRSA colonization surveillance program. It is not intended to diagnose MRSA infection nor to guide or monitor treatment for MRSA infections.   Culture, respiratory (NON-Expectorated)     Status: None   Collection Time: 09/22/16 11:38 AM  Result Value Ref Range Status   Specimen  Description TRACHEAL ASPIRATE  Final   Special Requests NONE  Final   Gram Stain   Final    MODERATE WBC PRESENT, PREDOMINANTLY PMN FEW YEAST FEW GRAM POSITIVE COCCI RARE GRAM POSITIVE RODS FEW GRAM NEGATIVE COCCOBACILLI    Culture FEW CANDIDA ALBICANS  Final   Report Status 09/24/2016 FINAL  Final         Radiology Studies: No results found.      Scheduled Meds: .  feeding supplement (ENSURE ENLIVE)  237 mL Oral BID BM  . folic acid  1 mg Oral Daily  . furosemide  40 mg Intravenous BID  . insulin aspart  2-6 Units Subcutaneous Q4H  . lactulose  20 g Oral TID  . LORazepam  0-4 mg Intravenous Q6H   Followed by  . [START ON 09/30/2016] LORazepam  0-4 mg Intravenous Q12H  . mouth rinse  15 mL Mouth Rinse BID  . multivitamin with minerals  1 tablet Oral Daily  . pantoprazole  40 mg Intravenous Q12H  . phytonadione  10 mg Oral Daily  . rifaximin  550 mg Oral BID  . sodium chloride flush  3 mL Intravenous Q12H  . spironolactone  100 mg Oral Daily  . thiamine  100 mg Oral Daily   Or  . thiamine  100 mg Intravenous Daily   Continuous Infusions: . sodium chloride Stopped (09/28/16 0900)  . azithromycin    . cefTRIAXone (ROCEPHIN)  IV       LOS: 8 days    Time spent: 35 minutes.     Kathlen Mody, MD [facility] Pager [phone]  If 7PM-7AM, please contact night-coverage [email or web address] Password [facility] 09/29/2016, 10:39 AM

## 2016-09-30 ENCOUNTER — Inpatient Hospital Stay (HOSPITAL_COMMUNITY): Payer: PRIVATE HEALTH INSURANCE

## 2016-09-30 DIAGNOSIS — R140 Abdominal distension (gaseous): Secondary | ICD-10-CM

## 2016-09-30 DIAGNOSIS — K7031 Alcoholic cirrhosis of liver with ascites: Principal | ICD-10-CM

## 2016-09-30 LAB — GLUCOSE, CAPILLARY
GLUCOSE-CAPILLARY: 102 mg/dL — AB (ref 65–99)
GLUCOSE-CAPILLARY: 105 mg/dL — AB (ref 65–99)
GLUCOSE-CAPILLARY: 89 mg/dL (ref 65–99)
GLUCOSE-CAPILLARY: 90 mg/dL (ref 65–99)
Glucose-Capillary: 96 mg/dL (ref 65–99)
Glucose-Capillary: 104 mg/dL — ABNORMAL HIGH (ref 65–99)

## 2016-09-30 LAB — PROTIME-INR
INR: 2.18
PROTHROMBIN TIME: 24.6 s — AB (ref 11.4–15.2)

## 2016-09-30 LAB — PATHOLOGIST SMEAR REVIEW

## 2016-09-30 LAB — AMMONIA: Ammonia: 54 umol/L — ABNORMAL HIGH (ref 9–35)

## 2016-09-30 MED ORDER — AZITHROMYCIN 500 MG PO TABS
500.0000 mg | ORAL_TABLET | Freq: Every day | ORAL | Status: DC
  Administered 2016-09-30 – 2016-10-02 (×3): 500 mg via ORAL
  Filled 2016-09-30 (×3): qty 1

## 2016-09-30 MED ORDER — METOCLOPRAMIDE HCL 5 MG/ML IJ SOLN
10.0000 mg | Freq: Four times a day (QID) | INTRAMUSCULAR | Status: AC
  Administered 2016-09-30 – 2016-10-01 (×3): 10 mg via INTRAVENOUS
  Filled 2016-09-30 (×3): qty 2

## 2016-09-30 MED ORDER — PANTOPRAZOLE SODIUM 40 MG PO TBEC
40.0000 mg | DELAYED_RELEASE_TABLET | Freq: Every day | ORAL | Status: DC
  Administered 2016-10-01 – 2016-10-13 (×13): 40 mg via ORAL
  Filled 2016-09-30 (×13): qty 1

## 2016-09-30 NOTE — Plan of Care (Signed)
Problem: Safety: Goal: Ability to remain free from injury will improve Outcome: Progressing Bed alarm on; room near nurses station, SR up x 3, call light within reach, bed in lowest position, hourly rounding

## 2016-09-30 NOTE — Progress Notes (Signed)
  Speech Language Pathology Treatment: Dysphagia  Patient Details Name: Jonuel Butterfield MRN: [id number] DOB: May 15, 1966 Today's Date: 09/30/2016 Time: 4098-1191 SLP Time Calculation (min) (ACUTE ONLY): 17 min  Assessment / Plan / Recommendation Clinical Impression  Frequent eructation during swallow increasing discoordination of swallow and chance of backflow into airway. Cognitive deficits and being a dependent feeder increase risk as well. Pt should sit fully upright with meals, stay up 45 min, no straws, small sips. RN stated she gave pills with water and SLP educated her to put whole in puree to promote bolus cohesion and transit. Continue Dys 3, thin with strict precautions. ST continue to follow.    HPI HPI: 51 yo male with hx of ETOH with cirrhosis with ascites and varices presented with vomiting blood.  He required intubation for airway protection.  He was hypotensive in ER      SLP Plan  Continue with current plan of care       Recommendations  Diet recommendations: Dysphagia 3 (mechanical soft);Thin liquid Liquids provided via: Cup;No straw Medication Administration: Whole meds with puree Supervision: Staff to assist with self feeding;Full supervision/cueing for compensatory strategies Compensations: Slow rate;Small sips/bites;Minimize environmental distractions Postural Changes and/or Swallow Maneuvers: Seated upright 90 degrees;Upright 30-60 min after meal                Oral Care Recommendations: Oral care BID Follow up Recommendations: Skilled Nursing facility SLP Visit Diagnosis: Dysphagia, oropharyngeal phase (R13.12) Plan: Continue with current plan of care       GO                Royce Macadamia 09/30/2016, 2:36 PM   Breck Coons Lonell Face.Ed [employer] [phone]

## 2016-09-30 NOTE — Progress Notes (Signed)
PROGRESS NOTE    Ray Hayden  ZOX:[id number] DOB: [birth year] DOA: 09/21/2016 PCP: Marcelo Baldy, MD    Brief Narrative: 51 year old alcoholic male with cirrhosis who presents with UGI bleed due to varices that was banded.  Patient was intubated for withdrawal and EGD. He was extubated on 4/24 and precedex discontinued on 4/25. He was transferred to [facility] on 4/26.   Assessment & Plan:   Active Problems:   GI bleed   LFTs abnormal   Encephalopathy, hepatic (HCC)   Liver disease, chronic, with cirrhosis (HCC)   Respiratory failure (HCC)   Upper GI bleed  in cirrhotic with previous hx GI bleeds,  EGDs and banding of esophageal varices dating 12/2015 - 07/2016 at [facility].  EGD 4/18: grade 3 esophageal varices with active bleeding. Banded x 4.  Completed 72 hours octreotide and Protonix. On BID IV Protonix. Changed to oral.  Currently on dysphagia 3 diet with thin liquids and able to tolerate well.  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 Able to pass flatulence and 2 bowel movements yesterday. GI ordered abd film, for evaluation of ileus,. Follow up.   Ascitic fluid analysis  Shows yellow fluid, with 201 wbc'S , gram stain does not show any wbc or organisms. No signs of SBP.   Resume rifaximin and lactulose.  Resume lasix and aldactone.  Remains confused .   Acute hepatic encephalopathy: still confused, resume rifaximin and lactulose.  Trend ammonia levels as needed.  Ammonia is 54.    ETOH dependence, with ETOH hepatitis and shock: improving.  Social worker consulted.    Acute respiratory failure possibly from aspiration pneumonia.  Completed 5 day s of zosyn,low grade temp last night 100.2  restarted antibiotics for another 3 days.    Hypokalemia:  Replaced.    DVT prophylaxis: SCD'S Code Status: full code.    Family Communication: none at beside Disposition Plan: pending PT eval.    Consultants:   Gastroenterology  PCCM.    Procedures:EGD on 4/18   Antimicrobials: zosyn completed 5 days, rocephin and zithromax.    Subjective: Wants to know when he can go home.   Objective: Vitals:   09/29/16 1403 09/29/16 2131 09/30/16 0551 09/30/16 0603  BP: (!) 117/52 (!) 116/48 120/71   Pulse: (!) 115 (!) 110 (!) 109   Resp: Temp: 98.6 F (37 C) 98.7 F (37.1 C) 98.5 F (36.9 C)   TempSrc:      SpO2: 91% 95% 95%   Weight:    103.5 kg (228 lb 2.8 oz)  Height:        Intake/Output Summary (Last 24 hours) at 09/30/16 1037 Last data filed at 09/30/16 8119  Gross per 24 hour  Intake             1160 ml  Output             2300 ml  Net            -1140 ml   Filed Weights   09/28/16 1835 09/29/16 0417 09/30/16 0603  Weight: 112.2 kg (247 lb 4.8 oz) 111.6 kg (246 lb) 103.5 kg (228 lb 2.8 oz)    Examination:  General exam: Appears calm and comfortable  Respiratory system: diminished at bases, scattered rhonchi, no wheezing.  Cardiovascular system: S1 & S2 heard, RRR.  2 +  pedal edema. Gastrointestinal system: Abdomen is distended, FIRM and nontender.Normal bowel sounds heard. Central nervous system: Alert , with slow speech. Confused. Able to move extremities .  Skin: No rashes, lesions or ulcers Psychiatry: flat affect, slow speech.     Data Reviewed: I have personally reviewed following labs and imaging studies  CBC:  Recent Labs Lab 09/25/16 0338 09/26/16 0412 09/27/16 0430 09/28/16 0335 09/29/16 0858  WBC 4.7 6.9 8.7 8.3 10.8*  HGB 7.9* 8.9* 8.6* 8.2* 9.2*  HCT 23.8* 26.7* 26.5* 24.7* 27.8*  MCV 93.0 94.0 94.6 94.3 93.3  PLT 79* 93* 103* 95* 144*   Basic Metabolic Panel:  Recent Labs Lab 09/24/16 0450 09/25/16 0338 09/26/16 0412 09/26/16 1752 09/27/16 0430 09/28/16 0335 09/29/16 0858 09/29/16 1833  NA 138 141 142 143 144 143 139 139  K  4.2 3.3* 3.2* 3.0* 3.3* 3.4* 3.6 3.5  CL 108 112* 111 114* 113* 111 107 108  CO2 21* GLUCOSE 149* 114* 114* 120* 115* 94 89 101*  BUN 36* 37* 28* 22* CREATININE 0.67 0.76 0.71 0.73 0.61 0.65 0.75 0.76  CALCIUM 7.1* 7.3* 7.5* 7.3* 7.7* 7.8* 8.0* 8.0*  MG 1.9 1.9 1.8  --  2.2  --  1.9  --   PHOS 2.1* 2.1* 3.6  --  3.2  --  3.4  --    GFR: Estimated Creatinine Clearance: 136 mL/min (by C-G formula based on SCr of 0.76 mg/dL). Liver Function Tests:  Recent Labs Lab 09/24/16 0450 09/27/16 0430  AST 670* 191*  ALT 277* 154*  ALKPHOS 61 65  BILITOT 7.4* 7.5*  PROT 5.4* 5.5*  ALBUMIN 2.1* 2.0*    Recent Labs Lab 09/27/16 0430  LIPASE 57*    Recent Labs Lab 09/24/16 1217 09/25/16 0337 09/26/16 0432 09/30/16 0711  AMMONIA 88* 47* 45* 54*   Coagulation Profile:  Recent Labs Lab 09/24/16 0450 09/27/16 0430 09/30/16 0711  INR 2.07 2.17 2.18   Cardiac Enzymes: No results for input(s): CKTOTAL, CKMB, CKMBINDEX, TROPONINI in the last 168 hours. BNP (last 3 results) No results for input(s): PROBNP in the last 8760 hours. HbA1C: No results for input(s): HGBA1C in the last 72 hours. CBG:  Recent Labs Lab 09/29/16 1542 09/29/16 2027 09/30/16 0002 09/30/16 0403 09/30/16 0748  GLUCAP 150* 107* 105* 102* 96   Lipid Profile: No results for input(s): CHOL, HDL, LDLCALC, TRIG, CHOLHDL, LDLDIRECT in the last 72 hours. Thyroid Function Tests: No results for input(s): TSH, T4TOTAL, FREET4, T3FREE, THYROIDAB in the last 72 hours. Anemia Panel: No results for input(s): VITAMINB12, FOLATE, FERRITIN, TIBC, IRON, RETICCTPCT in the last 72 hours. Sepsis Labs:  Recent Labs Lab 09/24/16 0450  PROCALCITON 1.45  LATICACIDVEN 1.9    Recent Results (from the past 240 hour(s))  MRSA PCR Screening     Status: None   Collection Time: 09/21/16 12:24 PM  Result Value Ref Range Status   MRSA by PCR NEGATIVE NEGATIVE Final    Comment:          The GeneXpert MRSA Assay (FDA approved for NASAL specimens only), is one component of a comprehensive MRSA colonization surveillance program. It is not intended to diagnose MRSA infection nor to guide or monitor treatment for MRSA infections.   Culture, respiratory (NON-Expectorated)     Status: None   Collection Time: 09/22/16 11:38 AM  Result Value Ref Range Status   Specimen Description TRACHEAL ASPIRATE  Final   Special Requests NONE  Final   Gram Stain   Final    MODERATE WBC PRESENT, PREDOMINANTLY PMN FEW YEAST FEW GRAM POSITIVE COCCI RARE GRAM POSITIVE RODS FEW GRAM NEGATIVE COCCOBACILLI    Culture FEW CANDIDA ALBICANS  Final   Report Status 09/24/2016 FINAL  Final  Gram stain     Status: None   Collection Time: 09/29/16 12:14 PM  Result Value Ref Range Status   Specimen Description PERITONEAL  Final   Special Requests NONE  Final   Gram Stain NO WBC SEEN NO ORGANISMS SEEN   Final   Report Status 09/29/2016 FINAL  Final         Radiology Studies: Ir Paracentesis  Result Date: 09/29/2016 INDICATION: Patient with ascites. Request is made for diagnostic and therapeutic paracentesis. EXAM: ULTRASOUND GUIDED DIAGNOSTIC AND THERAPEUTIC PARACENTESIS MEDICATIONS: 10 mL 1% lidocaine COMPLICATIONS: None immediate. PROCEDURE: Informed written consent was obtained from the patient after a discussion of the risks, benefits and alternatives to treatment. A timeout was performed prior to the initiation of the procedure. Initial ultrasound scanning demonstrates a large amount of ascites within the right lateral abdomen. The right lateral abdomen was prepped and draped in the usual sterile fashion. 1% lidocaine was used for local anesthesia. Following this, a 19 gauge, 7-cm, Yueh catheter was introduced. An ultrasound image was saved for documentation purposes. The paracentesis was performed. The catheter was removed and a dressing was applied. The patient tolerated the procedure  well without immediate post procedural complication. FINDINGS: A total of approximately 2.1 of clear, yellow fluid was removed. Samples were sent to the laboratory as requested by the clinical team. IMPRESSION: Successful ultrasound-guided diagnostic and therapeutic paracentesis yielding 2.1 liters of peritoneal fluid. Read by:  Loyce Dys PA-C Electronically Signed   By: Corlis Leak M.D.   On: 09/29/2016 14:29        Scheduled Meds: . feeding supplement (ENSURE ENLIVE)  237 mL Oral BID BM  . folic acid  1 mg Oral Daily  . furosemide  40 mg Intravenous BID  . insulin aspart  2-6 Units Subcutaneous Q4H  . lactulose  20 g Oral TID  . LORazepam  0-4 mg Intravenous Q12H  . mouth rinse  15 mL Mouth Rinse BID  . multivitamin with minerals  1 tablet Oral Daily  . pantoprazole  40 mg Intravenous Q12H  . phytonadione  10 mg Oral Daily  . rifaximin  550 mg Oral BID  . sodium chloride flush  3 mL Intravenous Q12H  . spironolactone  150 mg Oral Daily  . thiamine  100 mg Oral Daily   Continuous Infusions: . sodium chloride 30 mL/hr at 09/29/16 2323  . azithromycin Stopped (09/29/16 1507)  . cefTRIAXone (ROCEPHIN)  IV Stopped (09/29/16 1435)     LOS: 9 days    Time spent: 35 minutes.     Kathlen Mody, MD [facility] Pager [phone]  If 7PM-7AM, please contact night-coverage [email or web address] Password [facility] 09/30/2016, 10:37 AM

## 2016-09-30 NOTE — Evaluation (Signed)
Physical Therapy Evaluation Patient Details Name: Ray Hayden MRN: [id number] DOB: October 03, 1965 Today's Date: 09/30/2016   History of Present Illness  51 year old alcoholic male adm 09/21/2016 with UGI bleed due to varices that was banded. Patient vomiting blood & intubated for airway protection in setting of ETOH withdrawal; He was extubated on 4/24; PMHx: cirrhosis, ETOH, smoker, HTN  Clinical Impression  Pt admitted with above diagnosis. Pt currently with functional limitations due to the deficits listed below (see PT Problem List).  Pt will benefit from skilled PT to increase their independence and safety with mobility to allow discharge to the venue listed below.   Pt is globally weak and deconditioned; Eval is limited today by pt cognition, RN giving many meds and pt needed in xray (tech waiting);  will likely need SNF rehab prior to substance/ETOH rehab; will continue to follow in acute setting; discussed PT role with pt's family;     Follow Up Recommendations SNF    Equipment Recommendations  Other (comment) (TBD)    Recommendations for Other Services       Precautions / Restrictions Precautions Precautions: Fall Restrictions Weight Bearing Restrictions: No      Mobility  Bed Mobility               General bed mobility comments: assisted with partial roll and sitting up in bed; pt grossly mod to assist d/t cognition and difficulty following commands consistently  Transfers                    Ambulation/Gait                Stairs            Wheelchair Mobility    Modified Rankin (Stroke Patients Only)       Balance Overall balance assessment:  (unable to test this session)                                           Pertinent Vitals/Pain Pain Assessment: Faces Faces Pain Scale: Hurts a little bit Pain Location: with UE/LE movement; c/o abd and back pain at rest Pain Descriptors / Indicators:  Discomfort;Grimacing Pain Intervention(s): Monitored during session    Home Living Family/patient expects to be discharged to:: Skilled nursing facility Living Arrangements: Alone Available Help at Discharge: Family (one brother nearby and one in [place])             Additional Comments: per pt family he is independent with mobility at baseline, no DME to best of their knowledge; pt has difficulty answering questions relate to PLOF    Prior Function Level of Independence: Independent               Hand Dominance        Extremity/Trunk Assessment   Upper Extremity Assessment Upper Extremity Assessment: Generalized weakness    Lower Extremity Assessment Lower Extremity Assessment: Generalized weakness;RLE deficits/detail;LLE deficits/detail;Difficult to assess due to impaired cognition RLE Deficits / Details: AAROM WFL RLE Coordination: decreased gross motor LLE Deficits / Details: AAROM WFL LLE Coordination: decreased gross motor       Communication   Communication: Expressive difficulties  Cognition Arousal/Alertness: Awake/alert Behavior During Therapy: Flat affect Overall Cognitive Status: Impaired/Different from baseline Area of Impairment: Following commands;Safety/judgement;Problem solving;Orientation  Orientation Level: Disoriented to;Situation     Following Commands: Follows one step commands with increased time;Follows multi-step commands inconsistently Safety/Judgement: Decreased awareness of deficits   Problem Solving: Slow processing;Decreased initiation;Requires verbal cues;Requires tactile cues General Comments: pt has difficulty completing sentences/thoughts and staying on task; requires cues/redirection; pt requires cues, redirection to state his name and DOB for PT and RN      General Comments      Exercises     Assessment/Plan    PT Assessment Patient needs continued PT services  PT Problem List Decreased  strength;Decreased activity tolerance;Decreased coordination;Decreased mobility;Decreased cognition       PT Treatment Interventions DME instruction;Gait training;Functional mobility training;Therapeutic activities;Therapeutic exercise;Patient/family education    PT Goals (Current goals can be found in the Care Plan section)  Acute Rehab PT Goals Patient Stated Goal: per pt family--rehab at SNF PT Goal Formulation: With family Time For Goal Achievement: 10/14/16 Potential to Achieve Goals: Fair    Frequency Min 3X/week   Barriers to discharge        Co-evaluation               End of Session   Activity Tolerance: Other (comment) Patient left: in bed;with call bell/phone within reach;with family/visitor present;with bed alarm set   PT Visit Diagnosis: Muscle weakness (generalized) (M62.81)    Time: 1035-1050 PT Time Calculation (min) (ACUTE ONLY): 15 min   Charges:   PT Evaluation $PT Eval Moderate Complexity: 1 Procedure     PT G CodesDrucilla Chalet, PT Pager: [phone] 09/30/2016   [facility] 09/30/2016, 11:10 AM

## 2016-09-30 NOTE — Progress Notes (Signed)
Daily Rounding Note  09/30/2016, 9:58 AM  LOS: 9 days   SUBJECTIVE:   Chief complaint: Feels poorly but nothing specific. His abdomen is still distended. Denies nausea. Had at least 3 or 4 bowel movements yesterday. These were watery/brown.     OBJECTIVE:         Vital signs in last 24 hours:    Temp:  [98.5 F (36.9 C)-99.9 F (37.7 C)] 98.5 F (36.9 C) (04/27 0551) Pulse Rate:  [109-116] 109 (04/27 0551) Resp:  [18-33] 18 (04/27 0551) BP: (116-130)/(48-71) 120/71 (04/27 0551) SpO2:  [91 %-97 %] 95 % (04/27 0551) Weight:  [103.5 kg (228 lb 2.8 oz)] 103.5 kg (228 lb 2.8 oz) (04/27 0603) Last BM Date: 09/29/16 Filed Weights   09/28/16 1835 09/29/16 0417 09/30/16 0603  Weight: 112.2 kg (247 lb 4.8 oz) 111.6 kg (246 lb) 103.5 kg (228 lb 2.8 oz)   General: Looks unwell, bloated. Slow to respond to questions and commands.   Heart: tachy.  regular Chest: minor exp wheezing.  Fine crackles in bases.  No cough, no labored breathing Abdomen: bloated, protuberant.  Some tinkling/tympanitic BD.  NT.    Extremities:2 to 3 + pitting pedal edema Neuro/Psych:  No asterixis.  Psychomotor retardation.   Intake/Output from previous day: 04/26 0701 - 04/27 0700 In: 1976 [P.O.:1676; IV Piggyback:300] Out: 3500 [Urine:3500]   Lab Results: PT/INR  Recent Labs  09/30/16 0711  LABPROT 24.6*  INR 2.18   NH3 54   ASSESMENT:   * UGIB in cirrhotic with previous hx GI bleeds, EGDs and banding of esophageal varices dating 12/2015 - 07/2016 at [facility].  EGD 4/18: grade 3 esophageal varices with active bleeding. Banded x 4.  Completed 72 hours octreotide and Protonix. On BID IV Protonix.   * ABL anemia. s/p PRBC x 5. Last on 4/19. Hgb improved.  Thrombocytopenia, non-critical, improved.   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 Transaminitis, due to ETOH hepatitis and shock liver, are improved  * ETOH  dependence. Continued to drink until arrested for DUI 4/12, [facility].   * Coagulopathy. s/p FFP x 3 on 4/18.  Persists. Day 3/3 of oral Vitamin K and Coags not improved. Thrombocytopenia.   * Ascites, Anasarca.On Lasix 40, aldactone 100 mg daily PTA.  Now on Lasix 80 mg daily and Aldactone 50 mg daily.  Paracentesis 4/26, removed 2.1 liters.  No SBP.  PA said it was challenging to remove  The 2.1 liters, as he did not have that much ascites.   * AMS. Minor elevation Ammonia. On oral lactulose and Rifaximin.  Having at least 3 BMs per day. MS better but still with significant psychomotor slowing.  No asterixis.     * hypoproteinemia, suspect protein malnutrition. Eating well and drinking Ensure supplements.    *  CAP.  Fever 4/25.  WBCs elevated 4/26. Day 2 Zithromax, Rocephin.    PLAN   *  Switch to daily Protonix  PO.    *  2V abdomen with chest to assess for ileus as cause of abd distention and to reassess if he truly has PNA.    *  Needs PT/OT.  The way he appears clinically now, it looks like he is going to need a SNF rehab stay before resuming inpt ETOH rehab.    *  Does pt need 2D echo to assess for CHF?  It was normal in 2010.  But he could  have ETOH or other cardiomyopathy.    Addendum 1520: Diffuse SB and colonic ileus, anasarca per AAS.  Lungs with atx vs consolidation.  Going to add 3 doses of q 6 hour Reglan IV.    Jennye Moccasin  09/30/2016, 9:58 AM Pager: [phone]    [facility] Attending   I have taken an interval history, reviewed the chart and examined the patient. I agree with the Advanced Practitioner's note, impression and recommendations.    Iva Boop, MD, [facility] [phone] (pager) [phone] after 5 PM, weekends and holidays  09/30/2016 4:17 PM

## 2016-10-01 LAB — GLUCOSE, CAPILLARY
GLUCOSE-CAPILLARY: 136 mg/dL — AB (ref 65–99)
Glucose-Capillary: 102 mg/dL — ABNORMAL HIGH (ref 65–99)
Glucose-Capillary: 98 mg/dL (ref 65–99)
Glucose-Capillary: 115 mg/dL — ABNORMAL HIGH (ref 65–99)
Glucose-Capillary: 100 mg/dL — ABNORMAL HIGH (ref 65–99)

## 2016-10-01 LAB — BASIC METABOLIC PANEL
ANION GAP: 14 (ref 5–15)
BUN: 11 mg/dL (ref 6–20)
CHLORIDE: 106 mmol/L (ref 101–111)
CO2: 18 mmol/L — AB (ref 22–32)
Calcium: 8 mg/dL — ABNORMAL LOW (ref 8.9–10.3)
Creatinine, Ser: 0.84 mg/dL (ref 0.61–1.24)
GFR calc non Af Amer: >60 mL/min (ref >60)
Glucose, Bld: 113 mg/dL — ABNORMAL HIGH (ref 65–99)
Potassium: 4.1 mmol/L (ref 3.5–5.1)
Sodium: 138 mmol/L (ref 135–145)

## 2016-10-01 MED ORDER — LORAZEPAM 2 MG/ML IJ SOLN
0.0000 mg | Freq: Four times a day (QID) | INTRAMUSCULAR | Status: DC
Start: 2016-10-01 — End: 2016-10-01

## 2016-10-01 MED ORDER — LORAZEPAM 2 MG/ML IJ SOLN
1.0000 mg | Freq: Four times a day (QID) | INTRAMUSCULAR | Status: DC | PRN
  Administered 2016-10-02: 1 mg via INTRAVENOUS
  Filled 2016-10-01: qty 1

## 2016-10-01 MED ORDER — LORAZEPAM 2 MG/ML IJ SOLN
0.0000 mg | Freq: Four times a day (QID) | INTRAMUSCULAR | Status: DC
  Administered 2016-10-02: 2 mg via INTRAMUSCULAR
  Filled 2016-10-01: qty 1

## 2016-10-01 MED ORDER — LORAZEPAM 2 MG/ML IJ SOLN
0.0000 mg | Freq: Four times a day (QID) | INTRAMUSCULAR | Status: DC
  Administered 2016-10-01: 2 mg via INTRAVENOUS
  Administered 2016-10-01: 1 mg via INTRAVENOUS
  Filled 2016-10-01 (×2): qty 1

## 2016-10-01 MED ORDER — LORAZEPAM 2 MG/ML IJ SOLN
0.0000 mg | Freq: Two times a day (BID) | INTRAMUSCULAR | Status: DC
Start: 2016-10-04 — End: 2016-10-01

## 2016-10-01 MED ORDER — LORAZEPAM 1 MG PO TABS: 1.0000 mg | ORAL_TABLET | Freq: Four times a day (QID) | ORAL | Status: DC | PRN

## 2016-10-01 MED ORDER — LORAZEPAM 2 MG/ML IJ SOLN: 0.0000 mg | Freq: Two times a day (BID) | INTRAMUSCULAR | Status: DC

## 2016-10-01 MED ORDER — LORAZEPAM 2 MG/ML IJ SOLN
2.0000 mg | Freq: Once | INTRAMUSCULAR | Status: DC
  Filled 2016-10-01: qty 1

## 2016-10-01 NOTE — Clinical Social Work Note (Signed)
Clinical Social Work Assessment  Patient Details  Name: Ray Hayden MRN: [id number] Date of Birth: [birth year]  Date of referral:  10/01/16               Reason for consult:  Facility Placement                Permission sought to share information with:  Facility Medical sales representative, Family Supports Permission granted to share information::  Yes, Verbal Permission Granted  Name::     Ray Hayden::  SNFs  Relationship::  Sister in Diplomatic Services operational officer Information:  [phone]  Housing/Transportation Living arrangements for the past 2 months:  Mobile Home Source of Information:  Other (Comment Required) Patient Interpreter Needed:  None Criminal Activity/Legal Involvement Pertinent to Current Situation/Hospitalization:  No - Comment as needed Significant Relationships:  Siblings, Other Family Members, Significant Other Lives with:  Self Do you feel safe going back to the place where you live?  No Need for family participation in patient care:  Yes (Comment)  Care giving concerns:  CSW received consult for possible SNF placement at time of discharge. CSW spoke with patient's sister-in-law, Hilda Lias, regarding PT recommendation of SNF placement at time of discharge. Marie's husband is the patient's eldest brother. They live in [place] and have been trying to help coordinate patient's care. 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 Patient received several DWI's and his job mandated that he go to substance use rehab. He admitted to [facility], but was only there a short time before having to come back to the hospital. Hilda Lias reported that patient has a "sometimes involved girlfriend that is a toxic relationship." Hilda Lias and patient's brother would like for patient to go to SNF. She is concerned that patient may even end up in Hospice at his present condition. She is requesting a  psychiatry consult for a capacity evaluation as well as a palliative consult. She and patient's family are trying to sort through legal issues such as addressing patient's DNR status and filling out his FMLA paperwork. She also reports that patient's will has never been updated and has his ex wife listed as beneficiary. CSW explained that patient's brother would be able to make health care decisions but that financial matters would have to be decided with a lawyer. CSW to continue to follow and assist with discharge planning needs.   Social Worker assessment / plan:  CSW spoke with patient concerning possibility of rehab at [facility] before returning home. Hilda Lias provided CSW with patient's insurance information ([employer] [place], [place]: [id number], Group# [id number], Authorization: [id number], Customer Service: [phone])  Employment status:  Full-Time Insurance information:  Managed Care Teacher, music) PT Recommendations:  Skilled Nursing Facility Information / Referral to community resources:  Skilled Nursing Facility  Patient/Family's Response to care:  Patient recognizes need for rehab before returning home and is agreeable to a SNF. Hilda Lias reported preference for somewhere near [facility].  Patient/Family's Understanding of and Emotional Response to Diagnosis, Current Treatment, and Prognosis:  Patient/family is realistic regarding therapy needs and expressed being hopeful for SNF placement. Hilda Lias expressed understanding of CSW role and discharge process. She reports that she believes patient will never improve. No questions/concerns about plan or treatment.    Emotional Assessment Appearance:  Appears stated age Attitude/Demeanor/Rapport:  Unable to Assess Affect (typically observed):  Unable to Assess Orientation:  Oriented to Self, Oriented to Place  Alcohol / Substance use:  Not Applicable Psych involvement (Current and /or in the community):  No (Comment)  Discharge Needs  Concerns to  be addressed:  Care Coordination Readmission within the last 30 days:  Yes Current discharge risk:  None Barriers to Discharge:  Continued Medical Work up   [employer], LCSWA 10/01/2016, 3:03 PM

## 2016-10-01 NOTE — [place] FL2 (Signed)
[place] MEDICAID FL2 LEVEL OF CARE SCREENING TOOL     IDENTIFICATION  Patient Name: Ray Hayden Birthdate: 11/20/1965 Sex: male Admission Date (Current Location): 09/21/2016  [facility] and [place] Number:  Producer, television/film/video and Address:  [facility] [place], [address] [place], [place] [address]      Provider Number: [id number]  Attending Physician Name and Address:  Kathlen Mody, MD  Relative Name and Phone Number:       Current Level of Care: Hospital Recommended Level of Care: Skilled Nursing Facility Prior Approval Number:    Date Approved/Denied:   PASRR Number: [id number]  Discharge Plan: SNF    Current Diagnoses: Patient Active Problem List   Diagnosis Date Noted  . Abdominal distension   . Liver disease, chronic, with cirrhosis (HCC)   . Respiratory failure (HCC)   . Encephalopathy, hepatic (HCC)   . LFTs abnormal   . GI bleed 09/21/2016  . Hematemesis   . Acute blood loss anemia   . Alcoholic cirrhosis of liver with ascites (HCC)   . Ascites due to alcoholic cirrhosis (HCC)   . Coagulopathy (HCC)   . Hypovolemic shock (HCC)   . Lactic acidosis   . Bleeding esophageal varices (HCC)     Orientation RESPIRATION BLADDER Height & Weight     Self, Place  Normal Continent, Indwelling catheter Weight: 102.8 kg (226 lb 10.1 oz) Height:  6' (182.9 cm)  BEHAVIORAL SYMPTOMS/MOOD NEUROLOGICAL BOWEL NUTRITION STATUS      Incontinent (Rectal tube) Diet (Please see DC Summary)  AMBULATORY STATUS COMMUNICATION OF NEEDS Skin   Extensive Assist Verbally Normal                       Personal Care Assistance Level of Assistance  Bathing, Feeding, Dressing Bathing Assistance: Maximum assistance Feeding assistance: Limited assistance Dressing Assistance: Limited assistance     Functional Limitations Info             SPECIAL CARE FACTORS FREQUENCY  Speech therapy     PT Frequency: 5x/week       Speech Therapy Frequency:  2x/week      Contractures      Additional Factors Info  Code Status, Allergies, Insulin Sliding Scale Code Status Info: Full Allergies Info: Sulfa Antibiotics   Insulin Sliding Scale Info: Every 4 hours       Current Medications (10/01/2016):  This is the current hospital active medication list Current Facility-Administered Medications  Medication Dose Route Frequency Provider Last Rate Last Dose  . 0.9 %  sodium chloride infusion   Intravenous Continuous Coralyn Helling, MD 30 mL/hr at 09/29/16 2323    . acetaminophen (TYLENOL) tablet 325 mg  325 mg Oral Q6H PRN Alyson Reedy, MD   325 mg at 09/30/16 2100  . azithromycin (ZITHROMAX) tablet 500 mg  500 mg Oral Daily Kimberly B Hammons, RPH   500 mg at 10/01/16 0900  . cefTRIAXone (ROCEPHIN) 2 g in dextrose 5 % 50 mL IVPB  2 g Intravenous Q24H Kathlen Mody, MD   Stopped at 10/01/16 1231  . feeding supplement (ENSURE ENLIVE) (ENSURE ENLIVE) liquid 237 mL  237 mL Oral BID BM Coralyn Helling, MD   237 mL at 10/01/16 1309  . folic acid (FOLVITE) tablet 1 mg  1 mg Oral Daily Alyson Reedy, MD   1 mg at 10/01/16 0859  . furosemide (LASIX) injection 40 mg  40 mg Intravenous BID Iva Boop,  MD   40 mg at 10/01/16 1309  . insulin aspart (novoLOG) injection 2-6 Units  2-6 Units Subcutaneous Q4H Alyson Reedy, MD   2 Units at 09/29/16 1713  . lactulose (CHRONULAC) 10 GM/15ML solution 20 g  20 g Oral TID Dianah Field, PA-C   20 g at 10/01/16 0859  . LORazepam (ATIVAN) injection 0-4 mg  0-4 mg Intravenous Q6H Kathlen Mody, MD   1 mg at 10/01/16 1315  . MEDLINE mouth rinse  15 mL Mouth Rinse BID Coralyn Helling, MD   15 mL at 10/01/16 0909  . multivitamin with minerals tablet 1 tablet  1 tablet Oral Daily Alyson Reedy, MD   1 tablet at 10/01/16 0859  . pantoprazole (PROTONIX) EC tablet 40 mg  40 mg Oral Daily Kimberly B Hammons, RPH   40 mg at 10/01/16 0900  . rifaximin (XIFAXAN) tablet 550 mg  550 mg Oral BID Dianah Field, PA-C   550 mg at  10/01/16 0859  . sodium chloride flush (NS) 0.9 % injection 3 mL  3 mL Intravenous Q12H Coralyn Helling, MD   3 mL at 10/01/16 0902  . sodium chloride flush (NS) 0.9 % injection 3 mL  3 mL Intravenous PRN Coralyn Helling, MD      . spironolactone (ALDACTONE) tablet 150 mg  150 mg Oral Daily Iva Boop, MD   150 mg at 10/01/16 0900  . thiamine (VITAMIN B-1) tablet 100 mg  100 mg Oral Daily Alyson Reedy, MD   100 mg at 10/01/16 0900     Discharge Medications: Please see discharge summary for a list of discharge medications.  Relevant Imaging Results:  Relevant Lab Results:   Additional Information SSN: [id number]           Has [employer] insurance.   Mearl Latin, LCSWA

## 2016-10-01 NOTE — Progress Notes (Signed)
PROGRESS NOTE    Ray Hayden  VWU:[id number] DOB: 10-19-1965 DOA: 09/21/2016 PCP: Marcelo Baldy, MD    Brief Narrative: 51 year old alcoholic male with cirrhosis who presents with UGI bleed due to varices that was banded.  Patient was intubated for withdrawal and EGD. He was extubated on 4/24 and precedex discontinued on 4/25. He was [facility] to [facility] on 4/26.   Assessment & Plan:   Active Problems:   GI bleed   LFTs abnormal   Encephalopathy, hepatic (HCC)   Liver disease, chronic, with cirrhosis (HCC)   Respiratory failure (HCC)   Abdominal distension   Upper GI bleed  in cirrhotic with previous hx GI bleeds,  EGDs and banding of esophageal varices dating 12/2015 - 07/2016 at [facility].  EGD 4/18: grade 3 esophageal varices with active bleeding. Banded x 4.  Completed 72 hours octreotide and Protonix. On BID IV Protonix. Changed to oral.  Currently on dysphagia 3 diet with thin liquids and able to tolerate well.  3 days of vitamin K for coagulopathy, INR is 2.18   Alcoholic liver disease with cirrhosis and ascites;  US paracentesis by IR, only 2.1 liters removed, significant abd distention. Able to pass flatulence and 3 bowel movements yesterday. GI ordered abd film, for evaluation of ileus, it showed gaseous distention with ileus.  Recommended getting out of bed and in the chair.   Ascitic fluid analysis  Shows yellow fluid, with 201 wbc'S , gram stain does not show any wbc or organisms. No signs of SBP.   Resume rifaximin and lactulose.  Resume lasix and aldactone.  Remains confused .   Acute hepatic encephalopathy: still confused, resume rifaximin and lactulose.  Trend ammonia levels as needed.  Ammonia is 54.    ETOH dependence, with ETOH hepatitis and shock: improving.  Social worker consulted.    Acute respiratory failure possibly from aspiration pneumonia.  Completed 5 day s of zosyn,low grade temp on 4/26  100.2  restarted antibiotics for  another 3 days.    Hypokalemia:  Replaced.    DVT prophylaxis: SCD'S Code Status: full code.  Family Communication: none at beside Disposition Plan: pending PT eval.    Consultants:   Gastroenterology  PCCM.    Procedures:EGD on 4/18   Antimicrobials: zosyn completed 5 days, rocephin and zithromax.    Subjective: Confused.  Objective: Vitals:   09/30/16 2050 09/30/16 2340 10/01/16 0320 10/01/16 0518  BP: (!) 102/59 102/64  127/68  Pulse: (!) 113 (!) 113  (!) 122  Resp: Temp: 99.1 F (37.3 C) 98.8 F (37.1 C)  98.5 F (36.9 C)  TempSrc:      SpO2: 97% 95%  94%  Weight:   102.8 kg (226 lb 10.1 oz)   Height:        Intake/Output Summary (Last 24 hours) at 10/01/16 1347 Last data filed at 10/01/16 0538  Gross per 24 hour  Intake              660 ml  Output             1300 ml  Net             -640 ml   Filed Weights   09/29/16 0417 09/30/16 0603 10/01/16 0320  Weight: 111.6 kg (246 lb) 103.5 kg (228 lb 2.8 oz) 102.8 kg (226 lb 10.1 oz)    Examination:  General exam: Appears calm and comfortable  Respiratory system: diminished at bases, scattered  rhonchi, no wheezing.  Cardiovascular system: S1 & S2 heard, RRR.  2 +  pedal edema. Gastrointestinal system: Abdomen is distended, FIRM and nontender.Normal bowel sounds heard. Central nervous system: Alert , with slow speech. Confused. Able to move extremities .  Skin: No rashes, lesions or ulcers Psychiatry: flat affect, slow speech.     Data Reviewed: I have personally reviewed following labs and imaging studies  CBC:  Recent Labs Lab 09/25/16 0338 09/26/16 0412 09/27/16 0430 09/28/16 0335 09/29/16 0858  WBC 4.7 6.9 8.7 8.3 10.8*  HGB 7.9* 8.9* 8.6* 8.2* 9.2*  HCT 23.8* 26.7* 26.5* 24.7* 27.8*  MCV 93.0 94.0 94.6 94.3 93.3  PLT 79* 93* 103* 95* 144*   Basic Metabolic Panel:  Recent Labs Lab 09/25/16 0338 09/26/16 0412 09/26/16 1752 09/27/16 0430 09/28/16 0335  09/29/16 0858 09/29/16 1833  NA 141 142 143 144 143 139 139  K 3.3* 3.2* 3.0* 3.3* 3.4* 3.6 3.5  CL 112* 111 114* 113* 111 107 108  CO2 24 22 21* GLUCOSE 114* 114* 120* 115* 94 89 101*  BUN 37* 28* 22* CREATININE 0.76 0.71 0.73 0.61 0.65 0.75 0.76  CALCIUM 7.3* 7.5* 7.3* 7.7* 7.8* 8.0* 8.0*  MG 1.9 1.8  --  2.2  --  1.9  --   PHOS 2.1* 3.6  --  3.2  --  3.4  --    GFR: Estimated Creatinine Clearance: 135.5 mL/min (by C-G formula based on SCr of 0.76 mg/dL). Liver Function Tests:  Recent Labs Lab 09/27/16 0430  AST 191*  ALT 154*  ALKPHOS 65  BILITOT 7.5*  PROT 5.5*  ALBUMIN 2.0*    Recent Labs Lab 09/27/16 0430  LIPASE 57*    Recent Labs Lab 09/25/16 0337 09/26/16 0432 09/30/16 0711  AMMONIA 47* 45* 54*   Coagulation Profile:  Recent Labs Lab 09/27/16 0430 09/30/16 0711  INR 2.17 2.18   Cardiac Enzymes: No results for input(s): CKTOTAL, CKMB, CKMBINDEX, TROPONINI in the last 168 hours. BNP (last 3 results) No results for input(s): PROBNP in the last 8760 hours. HbA1C: No results for input(s): HGBA1C in the last 72 hours. CBG:  Recent Labs Lab 09/30/16 2007 09/30/16 2339 10/01/16 0354 10/01/16 0818 10/01/16 1213  GLUCAP 89 90 102* 98 115*   Lipid Profile: No results for input(s): CHOL, HDL, LDLCALC, TRIG, CHOLHDL, LDLDIRECT in the last 72 hours. Thyroid Function Tests: No results for input(s): TSH, T4TOTAL, FREET4, T3FREE, THYROIDAB in the last 72 hours. Anemia Panel: No results for input(s): VITAMINB12, FOLATE, FERRITIN, TIBC, IRON, RETICCTPCT in the last 72 hours. Sepsis Labs: No results for input(s): PROCALCITON, LATICACIDVEN in the last 168 hours.  Recent Results (from the past 240 hour(s))  Culture, respiratory (NON-Expectorated)     Status: None   Collection Time: 09/22/16 11:38 AM  Result Value Ref Range Status   Specimen Description TRACHEAL ASPIRATE  Final   Special Requests NONE  Final   Gram Stain    Final    MODERATE WBC PRESENT, PREDOMINANTLY PMN FEW YEAST FEW GRAM POSITIVE COCCI RARE GRAM POSITIVE RODS FEW GRAM NEGATIVE COCCOBACILLI    Culture FEW CANDIDA ALBICANS  Final   Report Status 09/24/2016 FINAL  Final  Culture, body fluid-bottle     Status: None (Preliminary result)   Collection Time: 09/29/16 12:14 PM  Result Value Ref Range Status   Specimen Description PERITONEAL  Final   Special Requests NONE  Final   Culture NO  GROWTH < 24 HOURS  Final   Report Status PENDING  Incomplete  Gram stain     Status: None   Collection Time: 09/29/16 12:14 PM  Result Value Ref Range Status   Specimen Description PERITONEAL  Final   Special Requests NONE  Final   Gram Stain NO WBC SEEN NO ORGANISMS SEEN   Final   Report Status 09/29/2016 FINAL  Final         Radiology Studies: Dg Abd Acute W/chest  Result Date: 09/30/2016 CLINICAL DATA:  Pneumonia, abdominal distension EXAM: DG ABDOMEN ACUTE W/ 1V CHEST COMPARISON:  09/21/2016, 09/27/2016 FINDINGS: Persistent low lung volumes with bibasilar atelectasis/consolidation. No enlarging effusion, developing edema, or pneumothorax. Trachea is midline. Mild cardiac enlargement. Moderate distention of the small bowel and colon diffusely, compatible with ileus rather than obstruction. Decubitus view is limited for free air evaluation. Scattered air-fluid levels on the decubitus view noted. Body wall edema noted compatible with anasarca. IMPRESSION: Low volume chest exam with persistent bibasilar atelectasis versus consolidation No developing pleural effusion Increased gaseous distension of small and large bowel diffusely more compatible with an ileus than obstruction Limited assessment for free air on the decubitus view Suspect body wall edema compatible with anasarca Electronically Signed   By: Judie Petit.  Shick M.D.   On: 09/30/2016 11:35        Scheduled Meds: . azithromycin  500 mg Oral Daily  . feeding supplement (ENSURE ENLIVE)  237 mL Oral  BID BM  . folic acid  1 mg Oral Daily  . furosemide  40 mg Intravenous BID  . insulin aspart  2-6 Units Subcutaneous Q4H  . lactulose  20 g Oral TID  . LORazepam  0-4 mg Intravenous Q6H  . mouth rinse  15 mL Mouth Rinse BID  . multivitamin with minerals  1 tablet Oral Daily  . pantoprazole  40 mg Oral Daily  . rifaximin  550 mg Oral BID  . sodium chloride flush  3 mL Intravenous Q12H  . spironolactone  150 mg Oral Daily  . thiamine  100 mg Oral Daily   Continuous Infusions: . sodium chloride 30 mL/hr at 09/29/16 2323  . cefTRIAXone (ROCEPHIN)  IV Stopped (10/01/16 1231)     LOS: 10 days    Time spent: 35 minutes.     Kathlen Mody, MD [facility] Pager [phone]  If 7PM-7AM, please contact night-coverage [email or web address] Password [email or web address] 10/01/2016, 1:47 PM

## 2016-10-01 NOTE — Progress Notes (Signed)
Subjective: Abdominal discomfort.  + flatus.  Objective: Vital signs in last 24 hours: Temp:  [98.5 F (36.9 C)-99.9 F (37.7 C)] 98.5 F (36.9 C) (04/28 0518) Pulse Rate:  [113-122] 122 (04/28 0518) Resp:  [19-22] 20 (04/28 0518) BP: (102-127)/(59-77) 127/68 (04/28 0518) SpO2:  [94 %-97 %] 94 % (04/28 0518) Weight:  [102.8 kg (226 lb 10.1 oz)] 102.8 kg (226 lb 10.1 oz) (04/28 0320) Last BM Date: 09/30/16  Intake/Output from previous day: 04/27 0701 - 04/28 0700 In: 836 [P.O.:600; I.V.:6; IV Piggyback:50] Out: 2000 [Urine:2000] Intake/Output this shift: No intake/output data recorded.  General appearance: alert and no distress Resp: clear to auscultation bilaterally Cardio: regular rate and rhythm GI: Distended, tympanic, tight, +BS Extremities: extremities normal, atraumatic, no cyanosis or edema  Lab Results:  Recent Labs  09/29/16 0858  WBC 10.8*  HGB 9.2*  HCT 27.8*  PLT 144*   BMET  Recent Labs  09/29/16 0858 09/29/16 1833  NA 139 139  K 3.6 3.5  CL 107 108  CO2 23 23  GLUCOSE 89 101*  BUN 13 11  CREATININE 0.75 0.76  CALCIUM 8.0* 8.0*   LFT No results for input(s): PROT, ALBUMIN, AST, ALT, ALKPHOS, BILITOT, BILIDIR, IBILI in the last 72 hours. PT/INR  Recent Labs  09/30/16 0711  LABPROT 24.6*  INR 2.18   Hepatitis Panel No results for input(s): HEPBSAG, HCVAB, HEPAIGM, HEPBIGM in the last 72 hours. C-Diff No results for input(s): CDIFFTOX in the last 72 hours. Fecal Lactopherrin No results for input(s): FECLLACTOFRN in the last 72 hours.  Studies/Results: Dg Abd Acute W/chest  Result Date: 09/30/2016 CLINICAL DATA:  Pneumonia, abdominal distension EXAM: DG ABDOMEN ACUTE W/ 1V CHEST COMPARISON:  09/21/2016, 09/27/2016 FINDINGS: Persistent low lung volumes with bibasilar atelectasis/consolidation. No enlarging effusion, developing edema, or pneumothorax. Trachea is midline. Mild cardiac enlargement. Moderate distention of the small bowel  and colon diffusely, compatible with ileus rather than obstruction. Decubitus view is limited for free air evaluation. Scattered air-fluid levels on the decubitus view noted. Body wall edema noted compatible with anasarca. IMPRESSION: Low volume chest exam with persistent bibasilar atelectasis versus consolidation No developing pleural effusion Increased gaseous distension of small and large bowel diffusely more compatible with an ileus than obstruction Limited assessment for free air on the decubitus view Suspect body wall edema compatible with anasarca Electronically Signed   By: Judie Petit.  Shick M.D.   On: 09/30/2016 11:35   Ir Paracentesis  Result Date: 09/29/2016 INDICATION: Patient with ascites. Request is made for diagnostic and therapeutic paracentesis. EXAM: ULTRASOUND GUIDED DIAGNOSTIC AND THERAPEUTIC PARACENTESIS MEDICATIONS: 10 mL 1% lidocaine COMPLICATIONS: None immediate. PROCEDURE: Informed written consent was obtained from the patient after a discussion of the risks, benefits and alternatives to treatment. A timeout was performed prior to the initiation of the procedure. Initial ultrasound scanning demonstrates a large amount of ascites within the right lateral abdomen. The right lateral abdomen was prepped and draped in the usual sterile fashion. 1% lidocaine was used for local anesthesia. Following this, a 19 gauge, 7-cm, Yueh catheter was introduced. An ultrasound image was saved for documentation purposes. The paracentesis was performed. The catheter was removed and a dressing was applied. The patient tolerated the procedure well without immediate post procedural complication. FINDINGS: A total of approximately 2.1 of clear, yellow fluid was removed. Samples were sent to the laboratory as requested by the clinical team. IMPRESSION: Successful ultrasound-guided diagnostic and therapeutic paracentesis yielding 2.1 liters of peritoneal fluid. Read by:  Lannette Donath  Ashley Royalty PA-C Electronically Signed   By: Corlis Leak M.D.   On: 09/29/2016 14:29    Medications:  Scheduled: . azithromycin  500 mg Oral Daily  . feeding supplement (ENSURE ENLIVE)  237 mL Oral BID BM  . folic acid  1 mg Oral Daily  . furosemide  40 mg Intravenous BID  . insulin aspart  2-6 Units Subcutaneous Q4H  . lactulose  20 g Oral TID  . LORazepam  0-4 mg Intravenous Q12H  . mouth rinse  15 mL Mouth Rinse BID  . multivitamin with minerals  1 tablet Oral Daily  . pantoprazole  40 mg Oral Daily  . rifaximin  550 mg Oral BID  . sodium chloride flush  3 mL Intravenous Q12H  . spironolactone  150 mg Oral Daily  . thiamine  100 mg Oral Daily   Continuous: . sodium chloride 30 mL/hr at 09/29/16 2323  . cefTRIAXone (ROCEPHIN)  IV Stopped (09/30/16 1225)    Assessment/Plan: 1) Ileus. 2) HCV/ETOH cirrhosis. 3) ABM discomfort. 4) Hepatic encephalopathy.   It does not appear that Reglan helped with his intestinal motility.  He was sitting up with his knees up resulting in more abdominal pressure.  I flattened out his bed more and instructed him to turn from side to side every 15 minutes.  This can help with evacuation of the air.  He does report having flatus, but not significantly.  A mild asterixis is present and he is on lactulose TID as well as rifaximin.  Plan: 1) Continue supportive care. 2) Turn from side to side if possible.  LOS: 10 days   Ray Hayden D 10/01/2016, 9:47 AM

## 2016-10-02 ENCOUNTER — Inpatient Hospital Stay (HOSPITAL_COMMUNITY): Payer: PRIVATE HEALTH INSURANCE

## 2016-10-02 LAB — GLUCOSE, CAPILLARY
GLUCOSE-CAPILLARY: 98 mg/dL (ref 65–99)
GLUCOSE-CAPILLARY: 129 mg/dL — AB (ref 65–99)
GLUCOSE-CAPILLARY: 109 mg/dL — AB (ref 65–99)
Glucose-Capillary: 103 mg/dL — ABNORMAL HIGH (ref 65–99)
Glucose-Capillary: 104 mg/dL — ABNORMAL HIGH (ref 65–99)
Glucose-Capillary: 108 mg/dL — ABNORMAL HIGH (ref 65–99)

## 2016-10-02 MED ORDER — LORAZEPAM 2 MG/ML IJ SOLN
0.0000 mg | Freq: Four times a day (QID) | INTRAMUSCULAR | Status: DC
  Administered 2016-10-02: 1 mg via INTRAVENOUS
  Administered 2016-10-02: 2 mg via INTRAVENOUS
  Filled 2016-10-02 (×2): qty 1

## 2016-10-02 MED ORDER — LORAZEPAM 2 MG/ML IJ SOLN: 0.0000 mg | Freq: Two times a day (BID) | INTRAMUSCULAR | Status: DC

## 2016-10-02 MED ORDER — LACTULOSE ENEMA
300.0000 mL | Freq: Every day | ORAL | Status: AC
  Administered 2016-10-02: 300 mL via RECTAL
  Filled 2016-10-02 (×2): qty 300

## 2016-10-02 NOTE — Progress Notes (Signed)
PROGRESS NOTE    Avyn Coate  ZOX:[id number] DOB: 1965/08/31 DOA: 09/21/2016 PCP: Marcelo Baldy, MD    Brief Narrative: 51 year old alcoholic male with cirrhosis who presents with UGI bleed due to varices that was banded.  Patient was intubated for withdrawal and EGD. He was extubated on 4/24 and precedex discontinued on 4/25. He was transferred to [facility] on 4/26.   Assessment & Plan:   Active Problems:   GI bleed   LFTs abnormal   Encephalopathy, hepatic (HCC)   Liver disease, chronic, with cirrhosis (HCC)   Respiratory failure (HCC)   Abdominal distension   Upper GI bleed  in cirrhotic with previous hx GI bleeds,  EGDs and banding of esophageal varices dating 12/2015 - 07/2016 at [facility].  EGD 4/18: grade 3 esophageal varices with active bleeding. Banded x 4.  Completed 72 hours octreotide and Protonix. On BID IV Protonix. Changed to oral.  Currently on dysphagia 3 diet with thin liquids and able to tolerate well.  3 days of vitamin K for coagulopathy, INR is 2.   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  GI ordered abd film, for evaluation of ileus, it showed gaseous distention with ileus.  Recommended getting out of bed and in the chair.  His abdominal distention is worse today and repeat abd film ordered.    Ascitic fluid analysis  Shows yellow fluid, with 201 wbc'S , gram stain does not show any wbc or organisms. No signs of SBP.   Resume rifaximin and lactulose.  Resume lasix and aldactone.  Remains confused .   Acute hepatic encephalopathy: still confused, resume rifaximin and lactulose. Added enema lactulose by GI.  Trend ammonia levels as needed.  Ammonia is 54.    ETOH dependence, with ETOH hepatitis and shock: improving.  Social worker consulted. PLAN FOR SNF on discharge.    Acute respiratory failure possibly from aspiration pneumonia.  Completed 5 day s  of zosyn,low grade temp on 4/26  100.2  restarted antibiotics, completed 9 days of antibiotics.    Hypokalemia:  Replaced.   inview of multiple co morbidities, multiple admissions, worsening liver issues, discussed with the brothers and called palliative care consult for goals of care .    DVT prophylaxis: SCD'S Code Status: full code.  Family Communication: discussed with brother at bedside.  Disposition Plan: SNF on discharge,    Consultants:   Gastroenterology  PCCM.    Procedures:EGD on 4/18   Antimicrobials: zosyn completed 5 days, rocephin and zithromax. 4 days.    Subjective: Confused. Slight tremors in the hands.  Objective: Vitals:   10/01/16 2052 10/02/16 0103 10/02/16 0459 10/02/16 1241  BP: (!) 120/59  119/63 134/78  Pulse: (!) 120  (!) 118 (!) 116  Resp: Temp: 98.5 F (36.9 C)  98.8 F (37.1 C) 98.7 F (37.1 C)  TempSrc:   Oral   SpO2: 93%  95% 99%  Weight:  101.5 kg (223 lb 12.3 oz)    Height:        Intake/Output Summary (Last 24 hours) at 10/02/16 1434 Last data filed at 10/02/16 0930  Gross per 24 hour  Intake              600 ml  Output              450 ml  Net  150 ml   Filed Weights   09/30/16 0603 10/01/16 0320 10/02/16 0103  Weight: 103.5 kg (228 lb 2.8 oz) 102.8 kg (226 lb 10.1 oz) 101.5 kg (223 lb 12.3 oz)    Examination:  General exam: Appears calm and comfortable bt confused.  Respiratory system: diminished at bases, scattered rhonchi, no wheezing.  Cardiovascular system: S1 & S2 heard, RRR.  2 +  pedal edema. Gastrointestinal system: Abdomen is more  Distended today, FIRM and nontender.Normal bowel sounds heard. Central nervous system: sleeping,  Able to move extremities .  Skin: No rashes, lesions or ulcers Psychiatry: flat affect, slow speech.     Data Reviewed: I have personally reviewed following labs and imaging studies  CBC:  Recent Labs Lab 09/26/16 0412 09/27/16 0430 09/28/16 0335  09/29/16 0858  WBC 6.9 8.7 8.3 10.8*  HGB 8.9* 8.6* 8.2* 9.2*  HCT 26.7* 26.5* 24.7* 27.8*  MCV 94.0 94.6 94.3 93.3  PLT 93* 103* 95* 144*   Basic Metabolic Panel:  Recent Labs Lab 09/26/16 0412  09/27/16 0430 09/28/16 0335 09/29/16 0858 09/29/16 1833 10/01/16 1410  NA 142  < > 144 143 139 139 138  K 3.2*  < > 3.3* 3.4* 3.6 3.5 4.1  CL 111  < > 113* 111 107 108 106  CO2 22  < > 18*  GLUCOSE 114*  < > 115* 94 89 101* 113*  BUN 28*  < > CREATININE 0.71  < > 0.61 0.65 0.75 0.76 0.84  CALCIUM 7.5*  < > 7.7* 7.8* 8.0* 8.0* 8.0*  MG 1.8  --  2.2  --  1.9  --   --   PHOS 3.6  --  3.2  --  3.4  --   --   < > = values in this interval not displayed. GFR: Estimated Creatinine Clearance: 128.3 mL/min (by C-G formula based on SCr of 0.84 mg/dL). Liver Function Tests:  Recent Labs Lab 09/27/16 0430  AST 191*  ALT 154*  ALKPHOS 65  BILITOT 7.5*  PROT 5.5*  ALBUMIN 2.0*    Recent Labs Lab 09/27/16 0430  LIPASE 57*    Recent Labs Lab 09/26/16 0432 09/30/16 0711  AMMONIA 45* 54*   Coagulation Profile:  Recent Labs Lab 09/27/16 0430 09/30/16 0711  INR 2.17 2.18   Cardiac Enzymes: No results for input(s): CKTOTAL, CKMB, CKMBINDEX, TROPONINI in the last 168 hours. BNP (last 3 results) No results for input(s): PROBNP in the last 8760 hours. HbA1C: No results for input(s): HGBA1C in the last 72 hours. CBG:  Recent Labs Lab 10/01/16 1927 10/02/16 0050 10/02/16 0337 10/02/16 0734 10/02/16 1347  GLUCAP 136* 103* 104* 98 129*   Lipid Profile: No results for input(s): CHOL, HDL, LDLCALC, TRIG, CHOLHDL, LDLDIRECT in the last 72 hours. Thyroid Function Tests: No results for input(s): TSH, T4TOTAL, FREET4, T3FREE, THYROIDAB in the last 72 hours. Anemia Panel: No results for input(s): VITAMINB12, FOLATE, FERRITIN, TIBC, IRON, RETICCTPCT in the last 72 hours. Sepsis Labs: No results for input(s): PROCALCITON, LATICACIDVEN in the last  168 hours.  Recent Results (from the past 240 hour(s))  Culture, body fluid-bottle     Status: None (Preliminary result)   Collection Time: 09/29/16 12:14 PM  Result Value Ref Range Status   Specimen Description PERITONEAL  Final   Special Requests NONE  Final   Culture NO GROWTH 3 DAYS  Final   Report Status PENDING  Incomplete  Gram  stain     Status: None   Collection Time: 09/29/16 12:14 PM  Result Value Ref Range Status   Specimen Description PERITONEAL  Final   Special Requests NONE  Final   Gram Stain NO WBC SEEN NO ORGANISMS SEEN   Final   Report Status 09/29/2016 FINAL  Final         Radiology Studies: No results found.      Scheduled Meds: . azithromycin  500 mg Oral Daily  . feeding supplement (ENSURE ENLIVE)  237 mL Oral BID BM  . folic acid  1 mg Oral Daily  . furosemide  40 mg Intravenous BID  . insulin aspart  2-6 Units Subcutaneous Q4H  . lactulose  20 g Oral TID  . lactulose  300 mL Rectal Daily  . LORazepam  0-4 mg Intravenous Q6H   Followed by  . [START ON 10/04/2016] LORazepam  0-4 mg Intravenous Q12H  . LORazepam  2 mg Intravenous Once  . mouth rinse  15 mL Mouth Rinse BID  . multivitamin with minerals  1 tablet Oral Daily  . pantoprazole  40 mg Oral Daily  . rifaximin  550 mg Oral BID  . sodium chloride flush  3 mL Intravenous Q12H  . spironolactone  150 mg Oral Daily  . thiamine  100 mg Oral Daily   Continuous Infusions: . cefTRIAXone (ROCEPHIN)  IV Stopped (10/02/16 1355)     LOS: 11 days    Time spent: 35 minutes.     Kathlen Mody, MD [facility] Pager [phone]  If 7PM-7AM, please contact night-coverage [email or web address] Password [facility] 10/02/2016, 2:34 PM

## 2016-10-02 NOTE — Progress Notes (Signed)
Subjective: Confused.  In mittens.  Objective: Vital signs in last 24 hours: Temp:  [98.5 F (36.9 C)-99.1 F (37.3 C)] 98.8 F (37.1 C) (04/29 0459) Pulse Rate:  [118-120] 118 (04/29 0459) Resp:  [16-20] 20 (04/29 0459) BP: (93-120)/(49-63) 119/63 (04/29 0459) SpO2:  [93 %-95 %] 95 % (04/29 0459) Weight:  [101.5 kg (223 lb 12.3 oz)] 101.5 kg (223 lb 12.3 oz) (04/29 0103) Last BM Date: 10/01/16  Intake/Output from previous day: 04/28 0701 - 04/29 0700 In: 480 [P.O.:480] Out: -  Intake/Output this shift: No intake/output data recorded.  General appearance: Confused.  Reports that he is in [place] Resp: clear to auscultation bilaterally Cardio: regular rate and rhythm GI: distended, tympanic, tight. Extremities: extremities normal, atraumatic, no cyanosis or edema  Lab Results:  Recent Labs  09/29/16 0858  WBC 10.8*  HGB 9.2*  HCT 27.8*  PLT 144*   BMET  Recent Labs  09/29/16 0858 09/29/16 1833 10/01/16 1410  NA 139 139 138  K 3.6 3.5 4.1  CL 107 108 106  CO2 23 23 18*  GLUCOSE 89 101* 113*  BUN CREATININE 0.75 0.76 0.84  CALCIUM 8.0* 8.0* 8.0*   LFT No results for input(s): PROT, ALBUMIN, AST, ALT, ALKPHOS, BILITOT, BILIDIR, IBILI in the last 72 hours. PT/INR  Recent Labs  09/30/16 0711  LABPROT 24.6*  INR 2.18   Hepatitis Panel No results for input(s): HEPBSAG, HCVAB, HEPAIGM, HEPBIGM in the last 72 hours. C-Diff No results for input(s): CDIFFTOX in the last 72 hours. Fecal Lactopherrin No results for input(s): FECLLACTOFRN in the last 72 hours.  Studies/Results: Dg Abd Acute W/chest  Result Date: 09/30/2016 CLINICAL DATA:  Pneumonia, abdominal distension EXAM: DG ABDOMEN ACUTE W/ 1V CHEST COMPARISON:  09/21/2016, 09/27/2016 FINDINGS: Persistent low lung volumes with bibasilar atelectasis/consolidation. No enlarging effusion, developing edema, or pneumothorax. Trachea is midline. Mild cardiac enlargement. Moderate  distention of the small bowel and colon diffusely, compatible with ileus rather than obstruction. Decubitus view is limited for free air evaluation. Scattered air-fluid levels on the decubitus view noted. Body wall edema noted compatible with anasarca. IMPRESSION: Low volume chest exam with persistent bibasilar atelectasis versus consolidation No developing pleural effusion Increased gaseous distension of small and large bowel diffusely more compatible with an ileus than obstruction Limited assessment for free air on the decubitus view Suspect body wall edema compatible with anasarca Electronically Signed   By: Judie Petit.  Shick M.D.   On: 09/30/2016 11:35    Medications:  Scheduled: . azithromycin  500 mg Oral Daily  . feeding supplement (ENSURE ENLIVE)  237 mL Oral BID BM  . folic acid  1 mg Oral Daily  . furosemide  40 mg Intravenous BID  . insulin aspart  2-6 Units Subcutaneous Q4H  . lactulose  20 g Oral TID  . lactulose  300 mL Rectal Daily  . LORazepam  0-4 mg Intravenous Q6H   Followed by  . [START ON 10/04/2016] LORazepam  0-4 mg Intravenous Q12H  . LORazepam  2 mg Intravenous Once  . mouth rinse  15 mL Mouth Rinse BID  . multivitamin with minerals  1 tablet Oral Daily  . pantoprazole  40 mg Oral Daily  . rifaximin  550 mg Oral BID  . sodium chloride flush  3 mL Intravenous Q12H  . spironolactone  150 mg Oral Daily  . thiamine  100 mg Oral Daily   Continuous: . sodium chloride 30 mL/hr at 10/02/16 0443  .  cefTRIAXone (ROCEPHIN)  IV Stopped (10/01/16 1231)    Assessment/Plan: 1) Encephalopathic versus ETOH withdrawal. 2) HCV/ETOH cirrhosis. 3) Ileus.   The patient's mental status has declined.  He has some asxterixis yesterday, but it was mild.  He is not oriented to place or time.  He is on lactulose and rifaximin.  I cannot discern if this his hepatic encephalopathy or ETOH withdrawal or a combination of the two.  His mental status has been an ongoing issue during this  hospitalization.  Even though he was admitted on 09/21/2016, ETOH withdrawal can still occur.  Plan: 1) Add on lactulose enema QD x 2 days. 2) Continue with oral lactulose and rifaximin. 3) Rotate from side to side to help with ileus.  LOS: 11 days   Lakrista Scaduto D 10/02/2016, 8:19 AM

## 2016-10-03 ENCOUNTER — Inpatient Hospital Stay (HOSPITAL_COMMUNITY): Payer: PRIVATE HEALTH INSURANCE

## 2016-10-03 DIAGNOSIS — K567 Ileus, unspecified: Secondary | ICD-10-CM

## 2016-10-03 LAB — GLUCOSE, CAPILLARY
GLUCOSE-CAPILLARY: 99 mg/dL (ref 65–99)
GLUCOSE-CAPILLARY: 103 mg/dL — AB (ref 65–99)
Glucose-Capillary: 104 mg/dL — ABNORMAL HIGH (ref 65–99)
Glucose-Capillary: 108 mg/dL — ABNORMAL HIGH (ref 65–99)
Glucose-Capillary: 123 mg/dL — ABNORMAL HIGH (ref 65–99)

## 2016-10-03 LAB — HEPATIC FUNCTION PANEL
ALBUMIN: 2.5 g/dL — AB (ref 3.5–5.0)
ALT: 72 U/L — AB (ref 17–63)
AST: 85 U/L — AB (ref 15–41)
Alkaline Phosphatase: 80 U/L (ref 38–126)
BILIRUBIN TOTAL: 8 mg/dL — AB (ref 0.3–1.2)
Bilirubin, Direct: 3.4 mg/dL — ABNORMAL HIGH (ref 0.1–0.5)
Indirect Bilirubin: 4.6 mg/dL — ABNORMAL HIGH (ref 0.3–0.9)
Total Protein: 6.5 g/dL (ref 6.5–8.1)

## 2016-10-03 MED ORDER — THIAMINE HCL 100 MG/ML IJ SOLN
250.0000 mg | Freq: Every day | INTRAMUSCULAR | Status: DC
  Administered 2016-10-03 – 2016-10-06 (×4): 250 mg via INTRAVENOUS
  Filled 2016-10-03 (×4): qty 4

## 2016-10-03 MED ORDER — MORPHINE SULFATE (PF) 4 MG/ML IV SOLN
1.0000 mg | INTRAVENOUS | Status: DC | PRN
  Administered 2016-10-03 – 2016-10-04 (×4): 2 mg via INTRAVENOUS
  Filled 2016-10-03 (×4): qty 1

## 2016-10-03 MED ORDER — NICOTINE 21 MG/24HR TD PT24
21.0000 mg | MEDICATED_PATCH | Freq: Every day | TRANSDERMAL | Status: DC
  Administered 2016-10-03 – 2016-10-13 (×11): 21 mg via TRANSDERMAL
  Filled 2016-10-03 (×11): qty 1

## 2016-10-03 NOTE — Progress Notes (Signed)
Palliative Medicine RN Note: Brother Brett Canales is in the pt's room. He states pt has another brother and a sister who want to be involved in decisions. He will contact both of them and call me back with a time for a conference call tomorrow.   Margret Chance Aubrianna Orchard, RN, BSN, [facility] 10/03/2016 3:19 PM Cell [phone] 8:00-4:00 Monday-Friday Office [phone]

## 2016-10-03 NOTE — Progress Notes (Signed)
Patient was found to be eating cigarettes while family in the room, I inform both patient and family that we are a tobacco free facility, they requested that we throw the remainder two cigarettes away.  Eduction provided on smoking cecassion and nicotine patch was requested by patient. Dr Blake Divine notified.

## 2016-10-03 NOTE — Progress Notes (Signed)
Daily Rounding Note  10/03/2016, 9:23 AM  LOS: 12 days   SUBJECTIVE:   Chief complaint: remains confused. Has not been getting out of bed.  Limited if any progress over weekend.   No n/v, eating and taking Ensure but overall limited intake.  Having unspecified # stools per day.   OBJECTIVE:         Vital signs in last 24 hours:    Temp:  [98 F (36.7 C)-99.2 F (37.3 C)] 98 F (36.7 C) (04/30 0839) Pulse Rate:  [104-117] 117 (04/30 0839) Resp:  [20] 20 (04/30 0438) BP: (121-150)/(69-101) 150/101 (04/30 0839) SpO2:  [92 %-99 %] 93 % (04/30 0839) Weight:  [101.7 kg (224 lb 3.2 oz)] 101.7 kg (224 lb 3.2 oz) (04/30 0438) Last BM Date: 10/02/16 Filed Weights   10/01/16 0320 10/02/16 0103 10/03/16 0438  Weight: 102.8 kg (226 lb 10.1 oz) 101.5 kg (223 lb 12.3 oz) 101.7 kg (224 lb 3.2 oz)   General: remains ill looking and tremulous   Heart: remains tachy, regular Chest: clear bil.  No labored breathing Abdomen: more tense and distended than last week.  Hypoactive, scant, tinkling BS.  NT  Extremities: slight pedal edema Neuro/Psych:  Confused as to year, place.  "[place]", "[place]".  Follows commands.  Tremor in hands but not clear asterixis.  Moves all 4 limbs.    Intake/Output from previous day: 04/29 0701 - 04/30 0700 In: 780 [P.O.:780] Out: 1050 [Urine:1050]  Intake/Output this shift: No intake/output data recorded.  Lab Results: No results for input(s): WBC, HGB, HCT, PLT in the last 72 hours. BMET  Recent Labs  10/01/16 1410  NA 138  K 4.1  CL 106  CO2 18*  GLUCOSE 113*  BUN 11  CREATININE 0.84  CALCIUM 8.0*   LFT  Recent Labs  10/03/16 0447  PROT 6.5  ALBUMIN 2.5*  AST 85*  ALT 72*  ALKPHOS 80  BILITOT 8.0*  BILIDIR 3.4*  IBILI 4.6*   PT/INR No results for input(s): LABPROT, INR in the last 72 hours. Hepatitis Panel No results for input(s): HEPBSAG, HCVAB, HEPAIGM, HEPBIGM in  the last 72 hours.  Studies/Results: Dg Abd 1 View  Result Date: 10/02/2016 CLINICAL DATA:  Constipation. EXAM: ABDOMEN - 1 VIEW COMPARISON:  09/30/2016 FINDINGS: Multiple lobes dilated small bowel are noted in the abdomen is similar to the prior exam. Areas seen within a nondilated colon. IMPRESSION: 1. Loops of dilated small bowel without colonic dilation. Is most consistent with a partial small bowel obstruction. There has been no significant change from the previous exam. Electronically Signed   By: Amie Portland M.D.   On: 10/02/2016 17:19   Scheduled Meds: . feeding supplement (ENSURE ENLIVE)  237 mL Oral BID BM  . folic acid  1 mg Oral Daily  . furosemide  40 mg Intravenous BID  . insulin aspart  2-6 Units Subcutaneous Q4H  . lactulose  20 g Oral TID  . lactulose  300 mL Rectal Daily  . LORazepam  0-4 mg Intravenous Q6H   Followed by  . [START ON 10/04/2016] LORazepam  0-4 mg Intravenous Q12H  . LORazepam  2 mg Intravenous Once  . mouth rinse  15 mL Mouth Rinse BID  . multivitamin with minerals  1 tablet Oral Daily  . pantoprazole  40 mg Oral Daily  . rifaximin  550 mg Oral BID  . sodium chloride flush  3 mL Intravenous Q12H  .  spironolactone  150 mg Oral Daily  . thiamine  100 mg Oral Daily   Continuous Infusions: PRN Meds:.acetaminophen, LORazepam **OR** LORazepam, morphine injection, sodium chloride flush  ASSESMENT:   *  Encephalopathy.   PO lactulose, Rifaximin in place for several days.  PR lactulose restarted 4/29.   Pall care cautioned against use of Lorazepam which still active on chart, though last doses 4/29 x 2.   Pt well beyond timing for ETOH withdrawal.   Last use prior to jail (DUI) on 4/12, enterred rehab on 4/13.    *  GI bleed.  Previous esophageal variceal banding at [facility] in 2017.   EGD 4/18: grade 3 esophageal varices with active bleeding. Banded x 4.   *  Blood loss anemia.  Hgb stableized post EGD.   Thrombocytopenia, improved.   *   Ascites.  2.1 liter tap on 4/26.  Not that much ascites.    *  Ileus. KUB for contd distention revealed SB and colonic ileus.  Yesterdays x ray read as partial SBO.    *  Sinus tachycardia.    *  Coagulopathy.  Received FFP pre EGD.  coags did not improve with Vitamin K last week.   *  ETOH cirrhosis.     PLAN   *  Coags, CBC, ammonia in AM.  KUB today, f/up ileus vs SBO.    *  Head CT and/or neuro eval?   Beginning to wonder if he has Wernickes?Marland Kitchen  *   Stopped active orders for Ativan.      Jennye Moccasin  10/03/2016, 9:23 AM Pager: [phone]

## 2016-10-03 NOTE — Progress Notes (Signed)
Physical Therapy Treatment Patient Details Name: Ray Hayden MRN: [id number] DOB: 14-Nov-1965 Today's Date: 10/03/2016    History of Present Illness 51 year old alcoholic male adm 09/21/2016 with UGI bleed due to varices that was banded. Patient vomiting blood & intubated for airway protection in setting of ETOH withdrawal; He was extubated on 4/24; PMHx: cirrhosis, ETOH, smoker, HTN    PT Comments    Pt able to get up to chair today with +2 assist. Pt HR 115-120bpm with mobility but improves to 110bpm with rest.  Pt more alert today and able to engage with PT and understand need for mobility and strengthening.   Follow Up Recommendations  SNF     Equipment Recommendations   (TBD)    Recommendations for Other Services       Precautions / Restrictions Precautions Precautions: Fall Restrictions Weight Bearing Restrictions: No    Mobility  Bed Mobility Overal bed mobility: Needs Assistance Bed Mobility: Supine to Sit     Supine to sit: Mod assist     General bed mobility comments: mod A for trunk and LEs  Transfers Overall transfer level: Needs assistance Equipment used: 2 person hand held assist Transfers: Stand Pivot Transfers;Sit to/from Stand Sit to Stand: +2 physical assistance Stand pivot transfers: +2 physical assistance       General transfer comment: pt requires +2 lifitng assist and +2 assist for turning and pivoting to sit in chair. Pt slides feet on floor, does not lift them to turn.  pt requires tactile cues and manual facilitation at trunk to shift weight and turn to sit  Ambulation/Gait                 Stairs            Wheelchair Mobility    Modified Rankin (Stroke Patients Only)       Balance Overall balance assessment: Needs assistance Sitting-balance support: Feet supported Sitting balance-Leahy Scale: Good Sitting balance - Comments: supervision to sit edge of bed     Standing balance-Leahy Scale: Zero Standing balance  comment: requires +2 assist to stand and maintain standing                            Cognition Arousal/Alertness: Awake/alert Behavior During Therapy: Flat affect Overall Cognitive Status: Impaired/Different from baseline                   Orientation Level: Disoriented to;Situation     Following Commands: Follows multi-step commands inconsistently;Follows one step commands with increased time Safety/Judgement: Decreased awareness of safety   Problem Solving: Slow processing;Decreased initiation;Difficulty sequencing;Requires verbal cues;Requires tactile cues General Comments: pt requires cuing to stay on task, increased time for processing and motor planning      Exercises      General Comments        Pertinent Vitals/Pain Pain Assessment: No/denies pain    Home Living                      Prior Function            PT Goals (current goals can now be found in the care plan section) Acute Rehab PT Goals Patient Stated Goal: per pt family--rehab at SNF PT Goal Formulation: With family Time For Goal Achievement: 10/14/16 Potential to Achieve Goals: Fair Progress towards PT goals: Progressing toward goals    Frequency    Min 3X/week  PT Plan Current plan remains appropriate    Co-evaluation              AM-PAC PT "6 Clicks" Daily Activity  Outcome Measure  Difficulty turning over in bed (including adjusting bedclothes, sheets and blankets)?: A Lot Difficulty moving from lying on back to sitting on the side of the bed? : A Lot Difficulty sitting down on and standing up from a chair with arms (e.g., wheelchair, bedside commode, etc,.)?: Total Help needed moving to and from a bed to chair (including a wheelchair)?: Total Help needed walking in hospital room?: Total Help needed climbing 3-5 steps with a railing? : Total 6 Click Score: 8    End of Session Equipment Utilized During Treatment: Gait belt Activity Tolerance:  Patient tolerated treatment well Patient left: in chair;with chair alarm set;with family/visitor present;with call bell/phone within reach Nurse Communication: Mobility status;Need for lift equipment PT Visit Diagnosis: Muscle weakness (generalized) (M62.81);Unsteadiness on feet (R26.81)     Time: 2841-3244 PT Time Calculation (min) (ACUTE ONLY): 27 min  Charges:  $Therapeutic Activity: 23-37 mins                    G Codes:       Reggy Eye, PT, DPT   Reyne Falconi 10/03/2016, 11:31 AM

## 2016-10-03 NOTE — Progress Notes (Signed)
Patient CIWA score at around midnight was a 1.  Patient sleeping and resting comfortably at time of assessment.  Will continue to monitor. Macarthur Critchley, RN

## 2016-10-03 NOTE — Progress Notes (Signed)
PROGRESS NOTE    Ray Hayden  NWG:[id number] DOB: Sep 30, 1965 DOA: 09/21/2016 PCP: Marcelo Baldy, MD    Brief Narrative: 51 year old alcoholic male with cirrhosis who presents with UGI bleed due to varices that was banded.  Patient was intubated for withdrawal and EGD. He was extubated on 4/24 and precedex discontinued on 4/25. He was transferred to [facility] on 4/26.   Assessment & Plan:   Active Problems:   GI bleed   LFTs abnormal   Encephalopathy, hepatic (HCC)   Liver disease, chronic, with cirrhosis (HCC)   Respiratory failure (HCC)   Abdominal distension   Upper GI bleed  in cirrhotic with previous hx GI bleeds,  EGDs and banding of esophageal varices dating 12/2015 - 07/2016 at [facility].  EGD 4/18: grade 3 esophageal varices with active bleeding. Banded x 4.  Completed 72 hours octreotide and Protonix. On BID IV Protonix. Changed to oral.  3 days of vitamin K for coagulopathy, INR is  Still 2, no improvement.    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  GI ordered abd film, for evaluation of ileus, it showed gaseous distention with ileus.  Recommended getting out of bed and in the chair.  His abdominal distention worsened and repeat abd film showed sbo.  Today he is more awake and his abd distention is better and he is able to pass flatulence and bowel movements. His repeat abd film shows improvement. No need for NG tube.    Ascitic fluid analysis  Shows yellow fluid, with 201 wbc'S , gram stain does not show any wbc or organisms. No signs of SBP.   Resume rifaximin and lactulose.  Resume lasix and aldactone.  Remains confused .   Acute hepatic encephalopathy: still confused, resume rifaximin and lactulose. Added enema lactulose by GI.  Trend ammonia levels.  Partial SBO:  Improving.   ETOH dependence, with ETOH hepatitis and shock: improving.  Social worker consulted.  PLAN FOR SNF on discharge.    Acute respiratory failure possibly from aspiration pneumonia.  Completed 5 day s of zosyn,low grade temp on 4/26  100.2  restarted antibiotics, completed 9 days of antibiotics.    Hypokalemia:  Replaced.   inview of multiple co morbidities, multiple admissions, worsening liver issues, discussed with the brothers and called palliative care consult for goals of care .    DVT prophylaxis: SCD'S Code Status: full code.  Family Communication: discussed with  Sister  at bedside.  Disposition Plan: SNF on discharge,    Consultants:   Gastroenterology  PCCM.    Procedures:EGD on 4/18   Antimicrobials: zosyn completed 5 days, rocephin and zithromax. 4 days.    Subjective: Confused. Slight tremors in the hands.  Objective: Vitals:   10/03/16 0438 10/03/16 0839 10/03/16 1126 10/03/16 1414  BP: 128/76 (!) 150/101 127/63 137/84  Pulse: (!) 104 (!) 117  (!) 113  Resp: 20   20  Temp: 99.2 F (37.3 C) 98 F (36.7 C)  98 F (36.7 C)  TempSrc: Oral Oral  Oral  SpO2: 92% 93%  92%  Weight: 101.7 kg (224 lb 3.2 oz)     Height:        Intake/Output Summary (Last 24 hours) at 10/03/16 1553 Last data filed at 10/03/16 1455  Gross per 24 hour  Intake              540 ml  Output  1402 ml  Net             -862 ml   Filed Weights   10/01/16 0320 10/02/16 0103 10/03/16 0438  Weight: 102.8 kg (226 lb 10.1 oz) 101.5 kg (223 lb 12.3 oz) 101.7 kg (224 lb 3.2 oz)    Examination:  General exam: Appears calm and comfortable bt confused.  Respiratory system: diminished at bases, scattered rhonchi, no wheezing.  Cardiovascular system: S1 & S2 heard, RRR.  2 +  pedal edema. Gastrointestinal system: Abdomen is more  Distended today, FIRM and nontender.Normal bowel sounds heard. Central nervous system: sleeping,  Able to move extremities .  Skin: No rashes, lesions or ulcers Psychiatry: flat affect, slow speech.     Data Reviewed: I have  personally reviewed following labs and imaging studies  CBC:  Recent Labs Lab 09/27/16 0430 09/28/16 0335 09/29/16 0858  WBC 8.7 8.3 10.8*  HGB 8.6* 8.2* 9.2*  HCT 26.5* 24.7* 27.8*  MCV 94.6 94.3 93.3  PLT 103* 95* 144*   Basic Metabolic Panel:  Recent Labs Lab 09/27/16 0430 09/28/16 0335 09/29/16 0858 09/29/16 1833 10/01/16 1410  NA 144 143 139 139 138  K 3.3* 3.4* 3.6 3.5 4.1  CL 113* 111 107 108 106  CO2 18*  GLUCOSE 115* 94 89 101* 113*  BUN CREATININE 0.61 0.65 0.75 0.76 0.84  CALCIUM 7.7* 7.8* 8.0* 8.0* 8.0*  MG 2.2  --  1.9  --   --   PHOS 3.2  --  3.4  --   --    GFR: Estimated Creatinine Clearance: 128.3 mL/min (by C-G formula based on SCr of 0.84 mg/dL). Liver Function Tests:  Recent Labs Lab 09/27/16 0430 10/03/16 0447  AST 191* 85*  ALT 154* 72*  ALKPHOS 65 80  BILITOT 7.5* 8.0*  PROT 5.5* 6.5  ALBUMIN 2.0* 2.5*    Recent Labs Lab 09/27/16 0430  LIPASE 57*    Recent Labs Lab 09/30/16 0711  AMMONIA 54*   Coagulation Profile:  Recent Labs Lab 09/27/16 0430 09/30/16 0711  INR 2.17 2.18   Cardiac Enzymes: No results for input(s): CKTOTAL, CKMB, CKMBINDEX, TROPONINI in the last 168 hours. BNP (last 3 results) No results for input(s): PROBNP in the last 8760 hours. HbA1C: No results for input(s): HGBA1C in the last 72 hours. CBG:  Recent Labs Lab 10/02/16 2018 10/03/16 0047 10/03/16 0433 10/03/16 0802 10/03/16 1131  GLUCAP 109* 108* 99 104* 123*   Lipid Profile: No results for input(s): CHOL, HDL, LDLCALC, TRIG, CHOLHDL, LDLDIRECT in the last 72 hours. Thyroid Function Tests: No results for input(s): TSH, T4TOTAL, FREET4, T3FREE, THYROIDAB in the last 72 hours. Anemia Panel: No results for input(s): VITAMINB12, FOLATE, FERRITIN, TIBC, IRON, RETICCTPCT in the last 72 hours. Sepsis Labs: No results for input(s): PROCALCITON, LATICACIDVEN in the last 168 hours.  Recent Results (from the  past 240 hour(s))  Culture, body fluid-bottle     Status: None (Preliminary result)   Collection Time: 09/29/16 12:14 PM  Result Value Ref Range Status   Specimen Description PERITONEAL  Final   Special Requests NONE  Final   Culture NO GROWTH 4 DAYS  Final   Report Status PENDING  Incomplete  Gram stain     Status: None   Collection Time: 09/29/16 12:14 PM  Result Value Ref Range Status   Specimen Description PERITONEAL  Final   Special Requests NONE  Final   Gram  Stain NO WBC SEEN NO ORGANISMS SEEN   Final   Report Status 09/29/2016 FINAL  Final         Radiology Studies: Dg Abd 1 View  Result Date: 10/03/2016 CLINICAL DATA:  Alcoholic hepatitis and cirrhosis, recent vera CO bleed. Small bowel obstruction. EXAM: ABDOMEN - 1 VIEW COMPARISON:  Supine abdominal radiograph of October 02, 2016 FINDINGS: There remains mild gaseous distention of the stomach. There are loops of moderately distended gas-filled small bowel in the mid and lower abdomen but the volume of gas has decreased and some distal migration of a gas into the colon and rectum has occurred. No free extraluminal gas collections are observed. IMPRESSION: Some improvement in the appearance of the small bowel obstruction with some distal migration of gas into the colon. Considerable gaseous distention of small bowel remains however. Electronically Signed   By: David  [place] M.D.   On: 10/03/2016 10:09   Dg Abd 1 View  Result Date: 10/02/2016 CLINICAL DATA:  Constipation. EXAM: ABDOMEN - 1 VIEW COMPARISON:  09/30/2016 FINDINGS: Multiple lobes dilated small bowel are noted in the abdomen is similar to the prior exam. Areas seen within a nondilated colon. IMPRESSION: 1. Loops of dilated small bowel without colonic dilation. Is most consistent with a partial small bowel obstruction. There has been no significant change from the previous exam. Electronically Signed   By: Amie Portland M.D.   On: 10/02/2016 17:19        Scheduled  Meds: . feeding supplement (ENSURE ENLIVE)  237 mL Oral BID BM  . folic acid  1 mg Oral Daily  . furosemide  40 mg Intravenous BID  . insulin aspart  2-6 Units Subcutaneous Q4H  . lactulose  20 g Oral TID  . lactulose  300 mL Rectal Daily  . mouth rinse  15 mL Mouth Rinse BID  . multivitamin with minerals  1 tablet Oral Daily  . nicotine  21 mg Transdermal Daily  . pantoprazole  40 mg Oral Daily  . rifaximin  550 mg Oral BID  . sodium chloride flush  3 mL Intravenous Q12H  . spironolactone  150 mg Oral Daily  . thiamine  100 mg Oral Daily   Continuous Infusions:    LOS: 12 days    Time spent: 35 minutes.     Kathlen Mody, MD [facility] Pager [phone]  If 7PM-7AM, please contact night-coverage [email or web address] Password [facility] 10/03/2016, 3:53 PM

## 2016-10-03 NOTE — Consult Note (Signed)
Reason for Consult: Small bowel obstruction Referring Physician: Dr. Venia Minks  Ray Hayden is an 51 y.o. male.  HPI: 51 year old male admitted on 09/21/16 with him) and respiratory failure. He has a history of alcoholic cirrhosis with ascites and varices with hematemesis. He also had acute respiratory failure with compromised airway and was intubated. He had acute metabolic encephalopathy on admission. Probable alcohol withdrawal. He was admitted by critical care medicine. His lactate was 5.3, hemoglobin 8.5, hematocrit 24. INR was 2.16, PTT was 40. He underwent upper endoscopy on 09/21/16, was found to have grade 3 esophageal varices which were bleeding. These were treated with banding by Dr. Loletha Carrow, [facility] [place] gastroenterology. He required pressors after intubation and banding. Which were weaned off by 422/18. Despite coming off pressors he remained intubated because of confusion and agitation, and was finally extubated on 09/27/16. He was started on a dysphagia 3 diet on 4/24/8.  He underwent ultrasound-guided paracentesis on 09/29/16, 2.1 L at that time. The paracentesis was difficult. Despite removal of 2 L he remained protuberant. He was seen on rounds by Dr. Heber Naplate on 10/01/16 it was his opinion he had developed an ileus. He is becoming more confused on 4/29 and was in mittens. Lactulose was added, and he was rotated from side to side to help with his ileus. Today he remains confused, reported that his eating and taking ensure but a limited intake. He has bowel movements reported yesterday and today. Repeat abdominal films this a.m. shows mild gaseous distention of the stomach there are loops of moderately distended gas filled small bowel in the mid and lower abdomen but the volume of gas is decreased somewhat there is migration of gas into the colon in the rectum free air or gas collections observed.  We are ask to see about possible SBO.  He is now currently NPO, I&O shows bowel movements yesterday and today.   He is afebrile but somewhat tachycardic. BP was up early this morning but it is back down to 127/63. No BMP this a.m. LFT shows a bilirubin of 8 indirect bilirubin 4.6 direct bilirubin 3.4. Total protein 6.5, ALT 72 AST 85 albumin 2.5, alkaline phosphatase 80 CBC on 09/29/16 shows a white count of 10.8, hemoglobin 9.2, hematocrit 27.8, platelets 144,000. His last INR on 09/30/16 is 2.18. He is getting lactulose, and a couple other PO meds.      Past Medical History:  Diagnosis Date  . Cirrhosis ([place])  Childs C   . Hypertension     Past Surgical History:  Procedure Laterality Date  . ESOPHAGOGASTRODUODENOSCOPY N/A 09/21/2016   Procedure: ESOPHAGOGASTRODUODENOSCOPY (EGD);  Surgeon: Doran Stabler, MD;  Location[facility] ENDOSCOPY;  Service: Endoscopy;  Laterality: N/A;  . IR PARACENTESIS  09/29/2016    History reviewed. No pertinent family history.  Social History:  reports that he has been smoking Cigarettes.  He has never used smokeless tobacco. He reports that he drinks alcohol. He reports that he does not use drugs.  Allergies:  Allergies  Allergen Reactions  . Sulfa Antibiotics     Medications:  Prior to Admission:  Prescriptions Prior to Admission  Medication Sig Dispense Refill Last Dose  . busPIRone (BUSPAR) 15 MG tablet Take 15 mg by mouth daily.    09/20/2016 at Unknown time  . carbamazepine (TEGRETOL) 200 MG tablet Take 100-200 mg by mouth as directed. TAPER 265m BID 4-16 to 4-18 (Pt has already had 5 doses) 104mBID 4-19 to 4-21 10030maily 4-22 to 4-29  09/20/2016 at pm  . furosemide (LASIX) 40 MG tablet Take 40 mg by mouth daily.   09/20/2016 at Unknown time  . gabapentin (NEURONTIN) 300 MG capsule Take 300 mg by mouth 3 (three) times daily.   09/20/2016 at Unknown time  . lactulose (CHRONULAC) 10 GM/15ML solution Take 10 g by mouth 2 (two) times daily as needed for mild constipation.   09/20/2016 at Unknown time  . magnesium oxide (MAG-OX) 400 MG tablet Take 400 mg by  mouth 2 (two) times daily.   09/20/2016 at Unknown time  . Melatonin 3 MG TABS Take 1 tablet by mouth at bedtime.   09/20/2016 at 2100  . Multiple Vitamin (MULTIVITAMIN WITH MINERALS) TABS tablet Take 1 tablet by mouth daily.   09/20/2016 at Unknown time  . pantoprazole (PROTONIX) 40 MG tablet Take 40 mg by mouth 2 (two) times daily.    09/20/2016 at 0900  . spironolactone (ALDACTONE) 100 MG tablet Take 100 mg by mouth daily.   09/20/2016 at Unknown time  . thiamine (VITAMIN B-1) 100 MG tablet Take 100 mg by mouth daily.   09/20/2016 at Unknown time  . LORazepam (ATIVAN PO) Take 0.5-2 mg by mouth. Pt completed a taper from 45m to 0.559mfrom 4-14 to 4-15   Completed Course at Unknown time  . venlafaxine XR (EFFEXOR-XR) 150 MG 24 hr capsule Take 150 mg by mouth daily with breakfast.   Not Taking at Unknown time   Scheduled: . feeding supplement (ENSURE ENLIVE)  237 mL Oral BID BM  . folic acid  1 mg Oral Daily  . furosemide  40 mg Intravenous BID  . insulin aspart  2-6 Units Subcutaneous Q4H  . lactulose  20 g Oral TID  . lactulose  300 mL Rectal Daily  . mouth rinse  15 mL Mouth Rinse BID  . multivitamin with minerals  1 tablet Oral Daily  . pantoprazole  40 mg Oral Daily  . rifaximin  550 mg Oral BID  . sodium chloride flush  3 mL Intravenous Q12H  . spironolactone  150 mg Oral Daily  . thiamine  100 mg Oral Daily   Continuous:  PREZM:[id number] injection, sodium chloride flush Anti-infectives    Start     Dose/Rate Route Frequency Ordered Stop   09/30/16 1130  azithromycin (ZITHROMAX) tablet 500 mg  Status:  Discontinued     500 mg Oral Daily 09/30/16 1121 10/02/16 1436   09/29/16 1100  cefTRIAXone (ROCEPHIN) 2 g in dextrose 5 % 50 mL IVPB  Status:  Discontinued     2 g 100 mL/hr over 30 Minutes Intravenous Every 24 hours 09/29/16 1002 10/02/16 1436   09/29/16 1100  azithromycin (ZITHROMAX) 500 mg in dextrose 5 % 250 mL IVPB  Status:  Discontinued     500 mg 250 mL/hr over  60 Minutes Intravenous Every 24 hours 09/29/16 1002 09/30/16 1121   09/29/16 0945  levofloxacin (LEVAQUIN) IVPB 500 mg  Status:  Discontinued     500 mg 100 mL/hr over 60 Minutes Intravenous Every 24 hours 09/29/16 0938 09/29/16 1001   09/27/16 1200  rifaximin (XIFAXAN) tablet 550 mg     550 mg Oral 2 times daily 09/27/16 1103     09/22/16 1830  piperacillin-tazobactam (ZOSYN) IVPB 3.375 g  Status:  Discontinued     3.375 g 12.5 mL/hr over 240 Minutes Intravenous Every 8 hours 09/22/16 1138 09/26/16 1048   09/22/16 1230  piperacillin-tazobactam (ZOSYN) IVPB 3.375 g  3.375 g 100 mL/hr over 30 Minutes Intravenous  Once 09/22/16 1138 09/22/16 1245   09/21/16 0930  cefTRIAXone (ROCEPHIN) 2 g in dextrose 5 % 50 mL IVPB  Status:  Discontinued     2 g 100 mL/hr over 30 Minutes Intravenous Every 24 hours 09/21/16 0918 09/22/16 1026      Results for orders placed or performed during the hospital encounter of 09/21/16 (from the past 48 hour(s))  Glucose, capillary     Status: Abnormal   Collection Time: 10/01/16 12:13 PM  Result Value Ref Range   Glucose-Capillary 115 (H) 65 - 99 mg/dL  Basic metabolic panel     Status: Abnormal   Collection Time: 10/01/16  2:10 PM  Result Value Ref Range   Sodium 138 135 - 145 mmol/L   Potassium 4.1 3.5 - 5.1 mmol/L   Chloride 106 101 - 111 mmol/L   CO2 18 (L) 22 - 32 mmol/L   Glucose, Bld 113 (H) 65 - 99 mg/dL   BUN 11 6 - 20 mg/dL   Creatinine, Ser 0.84 0.61 - 1.24 mg/dL   Calcium 8.0 (L) 8.9 - 10.3 mg/dL   GFR calc non Af Amer >60 >60 mL/min   GFR calc Af Amer >60 >60 mL/min    Comment: (NOTE) The eGFR has been calculated using the CKD EPI equation. This calculation has not been validated in all clinical situations. eGFR's persistently <60 mL/min signify possible Chronic Kidney Disease.    Anion gap 14 5 - 15  Glucose, capillary     Status: Abnormal   Collection Time: 10/01/16  5:00 PM  Result Value Ref Range   Glucose-Capillary 100 (H) 65  - 99 mg/dL  Glucose, capillary     Status: Abnormal   Collection Time: 10/01/16  7:27 PM  Result Value Ref Range   Glucose-Capillary 136 (H) 65 - 99 mg/dL  Glucose, capillary     Status: Abnormal   Collection Time: 10/02/16 12:50 AM  Result Value Ref Range   Glucose-Capillary 103 (H) 65 - 99 mg/dL  Glucose, capillary     Status: Abnormal   Collection Time: 10/02/16  3:37 AM  Result Value Ref Range   Glucose-Capillary 104 (H) 65 - 99 mg/dL  Glucose, capillary     Status: None   Collection Time: 10/02/16  7:34 AM  Result Value Ref Range   Glucose-Capillary 98 65 - 99 mg/dL  Glucose, capillary     Status: Abnormal   Collection Time: 10/02/16  1:47 PM  Result Value Ref Range   Glucose-Capillary 129 (H) 65 - 99 mg/dL  Glucose, capillary     Status: Abnormal   Collection Time: 10/02/16  4:41 PM  Result Value Ref Range   Glucose-Capillary 108 (H) 65 - 99 mg/dL   Comment 1 Notify RN   Glucose, capillary     Status: Abnormal   Collection Time: 10/02/16  8:18 PM  Result Value Ref Range   Glucose-Capillary 109 (H) 65 - 99 mg/dL  Glucose, capillary     Status: Abnormal   Collection Time: 10/03/16 12:47 AM  Result Value Ref Range   Glucose-Capillary 108 (H) 65 - 99 mg/dL  Glucose, capillary     Status: None   Collection Time: 10/03/16  4:33 AM  Result Value Ref Range   Glucose-Capillary 99 65 - 99 mg/dL  Hepatic function panel     Status: Abnormal   Collection Time: 10/03/16  4:47 AM  Result Value Ref Range   Total Protein  6.5 6.5 - 8.1 g/dL   Albumin 2.5 (L) 3.5 - 5.0 g/dL   AST 85 (H) 15 - 41 U/L   ALT 72 (H) 17 - 63 U/L   Alkaline Phosphatase 80 38 - 126 U/L   Total Bilirubin 8.0 (H) 0.3 - 1.2 mg/dL   Bilirubin, Direct 3.4 (H) 0.1 - 0.5 mg/dL   Indirect Bilirubin 4.6 (H) 0.3 - 0.9 mg/dL  Glucose, capillary     Status: Abnormal   Collection Time: 10/03/16  8:02 AM  Result Value Ref Range   Glucose-Capillary 104 (H) 65 - 99 mg/dL    Dg Abd 1 View  Result Date:  10/03/2016 CLINICAL DATA:  Alcoholic hepatitis and cirrhosis, recent vera CO bleed. Small bowel obstruction. EXAM: ABDOMEN - 1 VIEW COMPARISON:  Supine abdominal radiograph of October 02, 2016 FINDINGS: There remains mild gaseous distention of the stomach. There are loops of moderately distended gas-filled small bowel in the mid and lower abdomen but the volume of gas has decreased and some distal migration of a gas into the colon and rectum has occurred. No free extraluminal gas collections are observed. IMPRESSION: Some improvement in the appearance of the small bowel obstruction with some distal migration of gas into the colon. Considerable gaseous distention of small bowel remains however. Electronically Signed   By: David  Martinique M.D.   On: 10/03/2016 10:09   Dg Abd 1 View  Result Date: 10/02/2016 CLINICAL DATA:  Constipation. EXAM: ABDOMEN - 1 VIEW COMPARISON:  09/30/2016 FINDINGS: Multiple lobes dilated small bowel are noted in the abdomen is similar to the prior exam. Areas seen within a nondilated colon. IMPRESSION: 1. Loops of dilated small bowel without colonic dilation. Is most consistent with a partial small bowel obstruction. There has been no significant change from the previous exam. Electronically Signed   By: Lajean Manes M.D.   On: 10/02/2016 17:19    Review of Systems  Constitutional: Negative.   HENT: Negative.   Eyes: Negative.   Respiratory: Negative.   Cardiovascular: Negative.   Gastrointestinal: Negative for abdominal pain, blood in stool, constipation, diarrhea, melena, nausea and vomiting.       Markedly distended abdomen.  Tight but not really tender.  Genitourinary:       Foley catheter in place  Musculoskeletal: Negative.        He has only been up to the chair with PT since transfer from the ICU.  Skin: Negative.   Neurological: Negative.   Endo/Heme/Allergies: Bruises/bleeds easily (INR up without anticoagulation).  Psychiatric/Behavioral:       Mentation is  pretty good when I saw him but has been very confused.     Blood pressure 127/63, pulse (!) 117, temperature 98 F (36.7 C), temperature source Oral, resp. rate 20, height 6' (1.829 m), weight 101.7 kg (224 lb 3.2 oz), SpO2 93 %. Physical Exam  Constitutional: He is oriented to person, place, and time. He appears well-developed and well-nourished. No distress.  HENT:  Head: Normocephalic and atraumatic.  Mouth/Throat: No oropharyngeal exudate.  Eyes: Right eye exhibits no discharge. Left eye exhibits no discharge. No scleral icterus.  Pupils are equal  Neck: Normal range of motion. Neck supple. No JVD present. No tracheal deviation present. No thyromegaly present.  Cardiovascular: Normal rate, regular rhythm, normal heart sounds and intact distal pulses.   No murmur heard. Respiratory: Effort normal and breath sounds normal. No respiratory distress. He has no wheezes. He has no rales. He exhibits no tenderness.  GI:  He exhibits distension. He exhibits no mass. There is no tenderness. There is no rebound and no guarding.  Very distended abdomen, few BS.  Not really tender to palpation.  + Flatus and BM.  On lactulose.   RLQ scar with hx of Appy age 89  Genitourinary:  Genitourinary Comments: Foley in place  Musculoskeletal: He exhibits edema (trace). He exhibits no tenderness or deformity.  Lymphadenopathy:    He has no cervical adenopathy.  Neurological: He is alert and oriented to person, place, and time. No cranial nerve deficit.  Skin: Skin is warm and dry. No rash noted. He is not diaphoretic. No erythema. No pallor.  Psychiatric: He has a normal mood and affect. His behavior is normal. Judgment and thought content normal.  He was alert and knew where he was.  Reported drinking from and early age.  Not sure how much.      Assessment/Plan: Bleeding esophageal varices with banding 09/21/16 Dr. Wilfrid Lund. Alcoholic cirrhosis/hepatitis Alcohol dependence Acute respiratory  failure/possible aspiration pneumonia - off ventilator. Anemia/shock Acute hepatic encephalopathy/alcohol withdrawal Ileus    Plan:  He is having Bowel movements, getting an enema now.  No hx of SBO, only surgery age 76 with appendectomy.  I doubt this is an obstruction.  He is not a surgical candidate.  I would treat conservatively.  I do not think we have anything to add to his care.  I would defer to GI.     Tersa Fotopoulos 10/03/2016, 11:32 AM

## 2016-10-03 NOTE — Consult Note (Addendum)
Palliative Care Consult  Mr. Tod Persia is a 51 yo man with severe ETOH abuse, multiple failed rehabilitation attempts admitted with alcoholic hepatitis and cirrhosis -admitted with esophogeal varices bleed on 4/18 requiring intubation and ICU level care. Today he remains encephalopathic unable to answer my questions appropriately. Complex social situation-no clear medical surrogate decision maker. I attempted X1 to call his sister in law who has provided most of the information to staff and CSW.   We will attempt to get in touch with family, NOK. Continue with current care plan and see if his mental status improves and hepatic encephalopathy subsides-given his live dx he will be very sensitive to benzodiazepines and will be easily oversedated with standard doses. He received 2 doses in last 24 hours. Avoid additional medications that may contribute to confusion. Reglan was stopped today and this may help.  We will follow him closely and try to reach available family for further decision making- his prognosis is poor in general and given severity of disease would be appropriate for hospice referral.  Anderson Malta, DO Palliative Medicine

## 2016-10-03 NOTE — Progress Notes (Signed)
Pt's HR elevated to 150, now has returned to 110's. Pt's BP 150/101, RR 50 and pt complains of 9/10 pain in his abd. MD made aware.

## 2016-10-04 ENCOUNTER — Inpatient Hospital Stay (HOSPITAL_COMMUNITY): Payer: PRIVATE HEALTH INSURANCE

## 2016-10-04 DIAGNOSIS — Z515 Encounter for palliative care: Secondary | ICD-10-CM

## 2016-10-04 DIAGNOSIS — D72829 Elevated white blood cell count, unspecified: Secondary | ICD-10-CM

## 2016-10-04 DIAGNOSIS — Z7189 Other specified counseling: Secondary | ICD-10-CM

## 2016-10-04 DIAGNOSIS — G9340 Encephalopathy, unspecified: Secondary | ICD-10-CM

## 2016-10-04 LAB — CULTURE, BODY FLUID W GRAM STAIN -BOTTLE: Culture: NO GROWTH

## 2016-10-04 LAB — CBC
HCT: 26.6 % — ABNORMAL LOW (ref 39.0–52.0)
HEMOGLOBIN: 9 g/dL — AB (ref 13.0–17.0)
MCH: 32.3 pg (ref 26.0–34.0)
MCHC: 33.8 g/dL (ref 30.0–36.0)
MCV: 95.3 fL (ref 78.0–100.0)
Platelets: 164 10*3/uL (ref 150–400)
RBC: 2.79 MIL/uL — AB (ref 4.22–5.81)
RDW: 21.3 % — ABNORMAL HIGH (ref 11.5–15.5)
WBC: 19.6 10*3/uL — ABNORMAL HIGH (ref 4.0–10.5)

## 2016-10-04 LAB — PROTIME-INR
INR: 2.35
Prothrombin Time: 26.2 seconds — ABNORMAL HIGH (ref 11.4–15.2)

## 2016-10-04 LAB — LACTIC ACID, PLASMA: Lactic Acid, Venous: 2.4 mmol/L (ref 0.5–1.9)

## 2016-10-04 LAB — AMMONIA: AMMONIA: 56 umol/L — AB (ref 9–35)

## 2016-10-04 LAB — PROCALCITONIN: Procalcitonin: 0.31 ng/mL

## 2016-10-04 MED ORDER — LACTULOSE ENEMA
300.0000 mL | Freq: Two times a day (BID) | ORAL | Status: DC
  Administered 2016-10-04 – 2016-10-05 (×3): 300 mL via RECTAL
  Filled 2016-10-04 (×4): qty 300

## 2016-10-04 MED ORDER — IOPAMIDOL (ISOVUE-300) INJECTION 61%
INTRAVENOUS | Status: AC
  Administered 2016-10-04: 75 mL
  Filled 2016-10-04: qty 75

## 2016-10-04 MED ORDER — FENTANYL CITRATE (PF) 100 MCG/2ML IJ SOLN
25.0000 ug | INTRAMUSCULAR | Status: DC | PRN
  Administered 2016-10-04 – 2016-10-11 (×25): 25 ug via INTRAVENOUS
  Filled 2016-10-04 (×26): qty 2

## 2016-10-04 NOTE — Progress Notes (Signed)
PROGRESS NOTE    Ray Hayden  ZOX:[id number] DOB: 21-Mar-1966 DOA: 09/21/2016 PCP: Marcelo Baldy, MD    Brief Narrative: 51 year old alcoholic male with cirrhosis who presents with UGI bleed due to varices that was banded.  Patient was intubated for withdrawal and EGD. He was extubated on 4/24 and precedex discontinued on 4/25. He was transferred to [facility] on 4/26. Over the last 5 days , pt has been in out of confusion and encephalopathic. He has completed total of 9 days of IV antibiotics for possible aspiration pneumonia. In view of multiple admissions, non compliant to medications, alcohol abuse, palliative care consulted for goals of care. GI has been following the patient for upper GI bleed.   Assessment & Plan:   Active Problems:   GI bleed   LFTs abnormal   Encephalopathy, hepatic (HCC)   Liver disease, chronic, with cirrhosis (HCC)   Respiratory failure (HCC)   Abdominal distension   Goals of care, counseling/discussion   Palliative care encounter   Upper GI bleed  in cirrhotic with previous hx GI bleeds,  EGDs and banding of esophageal varices dating 12/2015 - 07/2016 at [facility].  EGD 4/18: grade 3 esophageal varices with active bleeding. Banded x 4.  Completed octreotide and is on PPI BID.  No improvement of coagulopathy with vitamin k.    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  GI ordered abd film, for evaluation of ileus, it showed gaseous distention with ileus, later progressed to partial sbo, which improved spontaneously. Repeat abd film with chest shows moderate dilated air-filled small bowel loops with air throughout the colon. Repeat US paracentesis ordered.  Resume rifaximin and lactulose,lasix and aldactone. Remains confused .   Acute hepatic encephalopathy: still confused, resume rifaximin and lactulose. Added enema lactulose by GI.  Trend ammonia levels.  CT head with and without contrast ordered to evaluate, not a great study due to  motion.   Partial SBO:  Improving.   ETOH dependence, with ETOH hepatitis and shock: improving.  Social worker consulted. PLAN FOR SNF on discharge.    Acute respiratory failure possibly from aspiration pneumonia.  Completed 5 day s of zosyn,low grade temp on 4/26  100.2  restarted antibiotics, completed 9 days of antibiotics. Elevated lactic acid this am, not sure if he is aspirating SLP eval.   Hypokalemia:  Replaced.   Leukocytosis:  ? Aspiration, get procalcitonin levels.  Afebrile. Monitor.   inview of multiple co morbidities, multiple admissions, worsening liver issues, discussed with the brothers and called palliative care consult for goals of care .    DVT prophylaxis: SCD'S Code Status: full code.  Family Communication: discussed with brothers at bedside.  Disposition Plan: SNF on discharge,    Consultants:   Gastroenterology  PCCM.    Procedures:EGD on 4/18   Antimicrobials: zosyn completed 5 days, rocephin and zithromax. 4 days.    Subjective: Confused. But alert and awake.   Objective: Vitals:   10/03/16 1414 10/03/16 2207 10/04/16 0531 10/04/16 1337  BP: 137/84 138/78 125/76 (!) 110/58  Pulse: (!) 113 (!) 115 (!) 121 (!) 117  Resp: Temp: 98 F (36.7 C) 98.6 F (37 C) 99.6 F (37.6 C) 99 F (37.2 C)  TempSrc: Oral   Oral  SpO2: 92% 94% 91% 90%  Weight:   102.4 kg (225 lb 12 oz)   Height:  Intake/Output Summary (Last 24 hours) at 10/04/16 1602 Last data filed at 10/04/16 1337  Gross per 24 hour  Intake              380 ml  Output              700 ml  Net             -320 ml   Filed Weights   10/02/16 0103 10/03/16 0438 10/04/16 0531  Weight: 101.5 kg (223 lb 12.3 oz) 101.7 kg (224 lb 3.2 oz) 102.4 kg (225 lb 12 oz)    Examination:  General exam: Appears calm and comfortable bt confused.  Respiratory system: diminished at bases,  scattered rhonchi, no wheezing.  Cardiovascular system: S1 & S2 heard, RRR.  2 +  pedal edema. Gastrointestinal system: Abdomen is more  Distended today, FIRM and nontender.Normal bowel sounds heard. Central nervous system: sleeping,  Able to move extremities .  Skin: No rashes, lesions or ulcers Psychiatry: flat affect, slow speech.     Data Reviewed: I have personally reviewed following labs and imaging studies  CBC:  Recent Labs Lab 09/28/16 0335 09/29/16 0858 10/04/16 0403  WBC 8.3 10.8* 19.6*  HGB 8.2* 9.2* 9.0*  HCT 24.7* 27.8* 26.6*  MCV 94.3 93.3 95.3  PLT 95* 144* 164   Basic Metabolic Panel:  Recent Labs Lab 09/28/16 0335 09/29/16 0858 09/29/16 1833 10/01/16 1410  NA 143 139 139 138  K 3.4* 3.6 3.5 4.1  CL 111 107 108 106  CO2 18*  GLUCOSE 94 89 101* 113*  BUN CREATININE 0.65 0.75 0.76 0.84  CALCIUM 7.8* 8.0* 8.0* 8.0*  MG  --  1.9  --   --   PHOS  --  3.4  --   --    GFR: Estimated Creatinine Clearance: 128.8 mL/min (by C-G formula based on SCr of 0.84 mg/dL). Liver Function Tests:  Recent Labs Lab 10/03/16 0447  AST 85*  ALT 72*  ALKPHOS 80  BILITOT 8.0*  PROT 6.5  ALBUMIN 2.5*   No results for input(s): LIPASE, AMYLASE in the last 168 hours.  Recent Labs Lab 09/30/16 0711 10/04/16 0403  AMMONIA 54* 56*   Coagulation Profile:  Recent Labs Lab 09/30/16 0711 10/04/16 0403  INR 2.18 2.35   Cardiac Enzymes: No results for input(s): CKTOTAL, CKMB, CKMBINDEX, TROPONINI in the last 168 hours. BNP (last 3 results) No results for input(s): PROBNP in the last 8760 hours. HbA1C: No results for input(s): HGBA1C in the last 72 hours. CBG:  Recent Labs Lab 10/03/16 0047 10/03/16 0433 10/03/16 0802 10/03/16 1131 10/03/16 1622  GLUCAP 108* 99 104* 123* 103*   Lipid Profile: No results for input(s): CHOL, HDL, LDLCALC, TRIG, CHOLHDL, LDLDIRECT in the last 72 hours. Thyroid Function Tests: No results for  input(s): TSH, T4TOTAL, FREET4, T3FREE, THYROIDAB in the last 72 hours. Anemia Panel: No results for input(s): VITAMINB12, FOLATE, FERRITIN, TIBC, IRON, RETICCTPCT in the last 72 hours. Sepsis Labs:  Recent Labs Lab 10/04/16 0701 10/04/16 1247  PROCALCITON  --  0.31  LATICACIDVEN 2.4*  --     Recent Results (from the past 240 hour(s))  Culture, body fluid-bottle     Status: None   Collection Time: 09/29/16 12:14 PM  Result Value Ref Range Status   Specimen Description PERITONEAL  Final   Special Requests NONE  Final   Culture NO GROWTH 5 DAYS  Final   Report  Status 10/04/2016 FINAL  Final  Gram stain     Status: None   Collection Time: 09/29/16 12:14 PM  Result Value Ref Range Status   Specimen Description PERITONEAL  Final   Special Requests NONE  Final   Gram Stain NO WBC SEEN NO ORGANISMS SEEN   Final   Report Status 09/29/2016 FINAL  Final         Radiology Studies: Dg Abd 1 View  Result Date: 10/03/2016 CLINICAL DATA:  Alcoholic hepatitis and cirrhosis, recent vera CO bleed. Small bowel obstruction. EXAM: ABDOMEN - 1 VIEW COMPARISON:  Supine abdominal radiograph of October 02, 2016 FINDINGS: There remains mild gaseous distention of the stomach. There are loops of moderately distended gas-filled small bowel in the mid and lower abdomen but the volume of gas has decreased and some distal migration of a gas into the colon and rectum has occurred. No free extraluminal gas collections are observed. IMPRESSION: Some improvement in the appearance of the small bowel obstruction with some distal migration of gas into the colon. Considerable gaseous distention of small bowel remains however. Electronically Signed   By: David  [place] M.D.   On: 10/03/2016 10:09   Dg Abd 1 View  Result Date: 10/02/2016 CLINICAL DATA:  Constipation. EXAM: ABDOMEN - 1 VIEW COMPARISON:  09/30/2016 FINDINGS: Multiple lobes dilated small bowel are noted in the abdomen is similar to the prior exam. Areas  seen within a nondilated colon. IMPRESSION: 1. Loops of dilated small bowel without colonic dilation. Is most consistent with a partial small bowel obstruction. There has been no significant change from the previous exam. Electronically Signed   By: Amie Portland M.D.   On: 10/02/2016 17:19   Ct Head W & Wo Contrast  Result Date: 10/04/2016 CLINICAL DATA:  Encephalopathy.  Cirrhosis. EXAM: CT HEAD WITHOUT AND WITH CONTRAST TECHNIQUE: Contiguous axial images were obtained from the base of the skull through the vertex without and with intravenous contrast CONTRAST:  75mL ISOVUE-300 IOPAMIDOL (ISOVUE-300) INJECTION 61% COMPARISON:  None. FINDINGS: Brain: Image quality degraded by extensive motion. Multiple repeat images due to motion. Mild atrophy. Negative for hydrocephalus. Negative for acute infarct. Negative for hemorrhage mass or edema. Normal enhancement postcontrast administration Vascular: Negative Skull: Negative Sinuses/Orbits: Negative Other: None IMPRESSION: Image quality degraded by extensive motion No acute abnormality. Electronically Signed   By: Marlan Palau M.D.   On: 10/04/2016 11:38   Dg Abd Acute W/chest  Result Date: 10/04/2016 CLINICAL DATA:  Small bowel obstruction. Fever, aspiration pneumonia. EXAM: DG ABDOMEN ACUTE W/ 1V CHEST COMPARISON:  10/03/2016 and 10/02/2016, 09/30/2016 FINDINGS: Lungs are hypoinflated with minimal left base opacification likely atelectasis, although cannot exclude infection. Cardiomediastinal silhouette and remainder of the chest is unchanged. Abdominal films demonstrate several air-filled dilated small bowel loops measuring up to 6.2 cm in diameter without significant change. There is moderate air throughout the colon. Small caliber catheter over the rectum. Remainder of the exam is unchanged. IMPRESSION: No significant change in moderate dilated air-filled small bowel loops with air throughout the colon. Hypoinflated lungs with mild left base opacification  likely atelectasis, although cannot exclude infection. Electronically Signed   By: Elberta Fortis M.D.   On: 10/04/2016 14:39        Scheduled Meds: . feeding supplement (ENSURE ENLIVE)  237 mL Oral BID BM  . folic acid  1 mg Oral Daily  . furosemide  40 mg Intravenous BID  . lactulose  20 g Oral TID  . lactulose  300  mL Rectal BID  . mouth rinse  15 mL Mouth Rinse BID  . multivitamin with minerals  1 tablet Oral Daily  . nicotine  21 mg Transdermal Daily  . pantoprazole  40 mg Oral Daily  . rifaximin  550 mg Oral BID  . sodium chloride flush  3 mL Intravenous Q12H  . spironolactone  150 mg Oral Daily  . thiamine injection  250 mg Intravenous Daily   Continuous Infusions:    LOS: 13 days    Time spent: 35 minutes.     Kathlen Mody, MD [facility] Pager [phone]  If 7PM-7AM, please contact night-coverage [email or web address] Password [email or web address] 10/04/2016, 4:02 PM

## 2016-10-04 NOTE — Progress Notes (Signed)
CRITICAL VALUE ALERT  Critical value received:  Lactic acid 2.4  Date of notification:  10/04/16   Time of notification: 0811  Critical value read back:Yes.    Nurse who received alert:  Vivianne Master  MD notified (1st page):  Blake Divine  Time of first page: 0813  Responding MD:  Blake Divine  Time MD responded:  [phone]

## 2016-10-04 NOTE — Progress Notes (Signed)
Patient slightly agitated during the night, attempted a few times to get out of bed.  Patient insisted he had to void and did not listen to our explanation of the catheter.  At this time, patient insisted on sitting on the side of the bed and was assisted to a sitting position.  Patient had increased work of breathing when sitting up.  Patient was assisted to stand at bedside while changing the bed linen and giving him a bath.  Patient tolerated standing for approximately 5 minutes and assisted back to bed.  Patient given pain medicine for abdominal pain and call bell placed within reach.  Will continue to monitor.   Macarthur Critchley, RN

## 2016-10-04 NOTE — Progress Notes (Signed)
Attempted lactulose enema pt was un able to tolerate all the lactulose only about 600 ML in. Md notified

## 2016-10-04 NOTE — Progress Notes (Signed)
        Daily Rounding Note  10/04/2016, 11:19 AM  LOS: 13 days   SUBJECTIVE:   Chief complaint: None  Met with Pall care this AM.  Head CT completed.  In/out of inappropriate behavior and confusion.  Sleeping alot  OBJECTIVE:         Vital signs in last 24 hours:    Temp:  [98 F (36.7 C)-99.6 F (37.6 C)] 99.6 F (37.6 C) (05/01 0531) Pulse Rate:  [113-121] 121 (05/01 0531) Resp:  [18-20] 18 (05/01 0531) BP: (125-138)/(63-84) 125/76 (05/01 0531) SpO2:  [91 %-94 %] 91 % (05/01 0531) Weight:  [102.4 kg (225 lb 12 oz)] 102.4 kg (225 lb 12 oz) (05/01 0531) Last BM Date: 10/04/16 Filed Weights   10/02/16 0103 10/03/16 0438 10/04/16 0531  Weight: 101.5 kg (223 lb 12.3 oz) 101.7 kg (224 lb 3.2 oz) 102.4 kg (225 lb 12 oz)   General: sleeping but arouseable.  Looks ill.    Heart: RRR, tachy Chest: clear bil.  Mouth breathing Abdomen: tense, distended, NT.  Hypoactive BS  Extremities: no periph edema Neuro/Psych:  Oriented to place, year.  Appropriate currently.  Tremor in hand but not clear asterixis/flap.    Intake/Output from previous day: 04/30 0701 - 05/01 0700 In: -  Out: 1052 [Urine:1050; Stool:2]  Intake/Output this shift: Total I/O In: -  Out: 150 [Urine:150]  Lab Results:  Recent Labs  10/04/16 0403  WBC 19.6*  HGB 9.0*  HCT 26.6*  PLT 164   BMET  Recent Labs  10/01/16 1410  NA 138  K 4.1  CL 106  CO2 18*  GLUCOSE 113*  BUN 11  CREATININE 0.84  CALCIUM 8.0*   LFT  Recent Labs  10/03/16 0447  PROT 6.5  ALBUMIN 2.5*  AST 85*  ALT 72*  ALKPHOS 80  BILITOT 8.0*  BILIDIR 3.4*  IBILI 4.6*   PT/INR  Recent Labs  10/04/16 0403  LABPROT 26.2*  INR 2.35   Hepatitis Panel No results for input(s): HEPBSAG, HCVAB, HEPAIGM, HEPBIGM in the last 72 hours.  Studies/Results: Dg Abd 1 View  Result Date: 10/03/2016 CLINICAL DATA:  Alcoholic hepatitis and cirrhosis, recent vera CO bleed.  Small bowel obstruction. EXAM: ABDOMEN - 1 VIEW COMPARISON:  Supine abdominal radiograph of October 02, 2016 FINDINGS: There remains mild gaseous distention of the stomach. There are loops of moderately distended gas-filled small bowel in the mid and lower abdomen but the volume of gas has decreased and some distal migration of a gas into the colon and rectum has occurred. No free extraluminal gas collections are observed. IMPRESSION: Some improvement in the appearance of the small bowel obstruction with some distal migration of gas into the colon. Considerable gaseous distention of small bowel remains however. Electronically Signed   By: David  Jordan M.D.   On: 10/03/2016 10:09   Dg Abd 1 View  Result Date: 10/02/2016 CLINICAL DATA:  Constipation. EXAM: ABDOMEN - 1 VIEW COMPARISON:  09/30/2016 FINDINGS: Multiple lobes dilated small bowel are noted in the abdomen is similar to the prior exam. Areas seen within a nondilated colon. IMPRESSION: 1. Loops of dilated small bowel without colonic dilation. Is most consistent with a partial small bowel obstruction. There has been no significant change from the previous exam. Electronically Signed   By: David  Ormond M.D.   On: 10/02/2016 17:19   Scheduled Meds: . feeding supplement (ENSURE ENLIVE)  237 mL Oral BID BM  . folic acid    1 mg Oral Daily  . furosemide  40 mg Intravenous BID  . lactulose  20 g Oral TID  . mouth rinse  15 mL Mouth Rinse BID  . multivitamin with minerals  1 tablet Oral Daily  . nicotine  21 mg Transdermal Daily  . pantoprazole  40 mg Oral Daily  . rifaximin  550 mg Oral BID  . sodium chloride flush  3 mL Intravenous Q12H  . spironolactone  150 mg Oral Daily  . thiamine injection  250 mg Intravenous Daily   Continuous Infusions: PRN Meds:.acetaminophen, fentaNYL (SUBLIMAZE) injection, sodium chloride flush  ASSESMENT:   *  Encephalopathy, multifactorial.  HE with mild elevation Ammonia, covered with Lactulose PO and PR as well  as Rifaximin.  Ativan discontinued, no doses in 3 days.  On IV thiamine.  Head CT ordered.    *  Ileus.  SB and colon   *  Leukocytosis.  ? Source.  Note elevated Lactate.  Latest U/A 4/18 (negative).  Latest CXR 4/27: atx vs consolidation.   *  Variceal bleed.  4/18 EGD: grade 3, bleeding esophageal varices, s/p banding x 4.    *  Ascites.  2.1 liter tap with cell count not c/w SBP.   No growth from fluid at 4 days.    *  Coagulopathy.    *  Blood loss anemia and suspect anemia of chronic dz.  Stable.   *  persistent sinus tachycardia.    PLAN   *  Await reading of completed head CT and completion of Pall care GOC meeting with pt and his brother this AM.    *  ? Place red rectal tube (not flexiseal)?  Will d/w Dr Pyrtle.     *    Restart rectal lactulose (Dr Hung had ordered only a few doses over the weekend).  From D/W RNs has had 1 BM daily in last 2 days.         10/04/2016, 11:19 AM Pager: [phone] 

## 2016-10-04 NOTE — Progress Notes (Signed)
Patient transported to radiology department for CT scan.

## 2016-10-04 NOTE — Progress Notes (Signed)
  Speech Language Pathology Treatment: Dysphagia  Patient Details Name: Rishaan Gunner MRN: [id number] DOB: [birth year] Today's Date: 10/04/2016 Time: 4098-1191 SLP Time Calculation (min) (ACUTE ONLY): 10 min  Assessment / Plan / Recommendation Clinical Impression  Pt restless with mild grimace stating he had pain in his belly (RN gave pain meds prior to this session). GI changed diet has been changed to clear liquids due to pt now having small ileus. Increased respiratory rate likely due to pain therefore session was brief. No overt s/s aspiration. Required verbal reminders why SLP was unable to lay head of bed down while eating/drinking. Will continue to follow.   HPI HPI: 51 yo male with hx of ETOH with cirrhosis with ascites and varices presented with vomiting blood.  He required intubation for airway protection.  He was hypotensive in ER      SLP Plan  Continue with current plan of care       Recommendations  Diet recommendations: Thin liquid (clear liquids) Liquids provided via: Cup;No straw Medication Administration: Whole meds with puree Supervision: Staff to assist with self feeding;Full supervision/cueing for compensatory strategies Compensations: Slow rate;Small sips/bites;Minimize environmental distractions Postural Changes and/or Swallow Maneuvers: Seated upright 90 degrees;Upright 30-60 min after meal                Oral Care Recommendations: Oral care BID Follow up Recommendations: Skilled Nursing facility SLP Visit Diagnosis: Dysphagia, oropharyngeal phase (R13.12) Plan: Continue with current plan of care       GO                Royce Macadamia 10/04/2016, 9:48 AM  Breck Coons Lonell Face.Ed [employer] [phone]

## 2016-10-04 NOTE — Progress Notes (Signed)
Daily Progress Note   Patient Name: Ray Hayden       Date: 10/04/2016 DOB: 1966/05/18  Age: 51 y.o. MRN#: [id number] Attending Physician: Kathlen Mody, MD Primary Care Physician: Marcelo Baldy, MD Admit Date: 09/21/2016  Reason for Consultation/Follow-up: Palliative care consulted to assist with GOC and decision making with family d/t worsening liver cirrhosis, poor improvement, and poor prognosis.   Subjective: Ray Hayden is lying in bed. More alert and oriented (although would not answer all my questions) he seemed to have good basic understanding of our conversation and his condition. He is very uncomfortable and abd extremely distended. Family at bedside.   Length of Stay: 13  Current Medications: Scheduled Meds:  . feeding supplement (ENSURE ENLIVE)  237 mL Oral BID BM  . folic acid  1 mg Oral Daily  . furosemide  40 mg Intravenous BID  . lactulose  20 g Oral TID  . lactulose  300 mL Rectal Daily  . mouth rinse  15 mL Mouth Rinse BID  . multivitamin with minerals  1 tablet Oral Daily  . nicotine  21 mg Transdermal Daily  . pantoprazole  40 mg Oral Daily  . rifaximin  550 mg Oral BID  . sodium chloride flush  3 mL Intravenous Q12H  . spironolactone  150 mg Oral Daily  . thiamine injection  250 mg Intravenous Daily    Continuous Infusions:   PRN Meds: acetaminophen, morphine injection, sodium chloride flush  Physical Exam  Constitutional: He appears well-developed.  HENT:  Head: Normocephalic and atraumatic.  Cardiovascular: Tachycardia present.   Pulmonary/Chest: No accessory muscle usage. Tachypnea noted. No respiratory distress. He has decreased breath sounds.  Abdominal: He exhibits distension and ascites.  Neurological: He is alert.  Mostly oriented, able to follow  questions and answer appropriately  Nursing note and vitals reviewed.           Vital Signs: BP 125/76 (BP Location: Left Arm)   Pulse (!) 121   Temp 99.6 F (37.6 C)   Resp 18   Ht 6' (1.829 m)   Wt 102.4 kg (225 lb 12 oz)   SpO2 91%   BMI 30.62 kg/m  SpO2: SpO2: 91 % O2 Device: O2 Device: Not Delivered O2 Flow Rate: O2 Flow Rate (L/min): 2 L/min  Intake/output summary:  Intake/Output Summary (Last  24 hours) at 10/04/16 0841 Last data filed at 10/03/16 1952  Gross per 24 hour  Intake                0 ml  Output             1052 ml  Net            -1052 ml   LBM: Last BM Date: 10/03/16 Baseline Weight: Weight: 83.9 kg (185 lb) Most recent weight: Weight: 102.4 kg (225 lb 12 oz)       Palliative Assessment/Data:      Patient Active Problem List   Diagnosis Date Noted  . Abdominal distension   . Liver disease, chronic, with cirrhosis (HCC)   . Respiratory failure (HCC)   . Encephalopathy, hepatic (HCC)   . LFTs abnormal   . GI bleed 09/21/2016  . Hematemesis   . Acute blood loss anemia   . Alcoholic cirrhosis of liver with ascites (HCC)   . Ascites due to alcoholic cirrhosis (HCC)   . Coagulopathy (HCC)   . Hypovolemic shock (HCC)   . Lactic acidosis   . Bleeding esophageal varices (HCC)     Palliative Care Assessment & Plan   HPI: 51 year old male with alcoholic cirrhosis who presents with UGI bleed due to varices that was banded. Patient was intubated for withdrawal and EGD ON 4/18. He was extubated on 4/24 and precedex discontinued on 4/25. Paracentesis removed 2.1L on 4/26 with no signs of SBP. Continues to have hepatic encephalopathy. Complicated by ileus. Previous h/o esophageal varice banding 12/2015 and 07/2016 with recurrent bleeding. Palliative care consulted to assist with GOC and decision making with family d/t worsening liver cirrhosis, poor improvement, and poor prognosis.   Assessment: I had a discussion with family - 2 brothers in person and a  sister and sister-in-law via telephone. Family has good understanding of his poor prognosis with worsening liver cirrhosis. They are supportive of DNR and hospice if he qualifies. Comfort is priority for them. They asked about head CT and do want to help him improve if this is possible but they do understand that overall prognosis is very poor and he is high risk for multiple complications that could lead to his death.   I spoke more with Ray Hayden and his 2 brothers at bedside. Ray Hayden seems to understand that he is very ill. He also remembers being on vent and how horrible this was for him. We had a discussion regarding his wishes at EOL and he does consistently tell me that he would want to be placed back on the vent and resuscitated although I do not believe he understands how poor a prognosis this would be for him. I do believe he is aware enough to make this decision at this time. His main concern is changing his beneficiary from his ex-wife as he is concerned about her receiving his benefits when he dies - brothers are helping attain attorney to assist with this goal.   Recommendations/Plan:  Please reconsult IR for repeat paracentesis. He is very uncomfortable and I would like to minimize sedating medications to continue having conversations with him.   Please consider head CT.   Changing morphine to fentanyl prn to try and minimize opioids in system.   Goals of Care and Additional Recommendations:  Limitations on Scope of Treatment: Full Scope Treatment  Code Status:  Full code - this was decided by the patient (family is supportive of DNR)  Prognosis:  Unable to determine right now but overall prognosis is poor. Unlikely to be hospice facility appropriate at this time unless he declines further.   Discharge Planning:  To Be Determined. Will likely need SNF.    Thank you for allowing the Palliative Medicine Team to assist in the care of this patient.   Time In: 0930 Time Out: 1030  Total Time Prolonged Time Billed  no       Greater than 50%  of this time was spent counseling and coordinating care related to the above assessment and plan.  Yong Channel, NP Palliative Medicine Team Pager # [phone] (M-F 8a-5p) Team Phone # [phone] (Nights/Weekends)

## 2016-10-05 ENCOUNTER — Inpatient Hospital Stay (HOSPITAL_COMMUNITY): Payer: PRIVATE HEALTH INSURANCE

## 2016-10-05 DIAGNOSIS — K7200 Acute and subacute hepatic failure without coma: Secondary | ICD-10-CM

## 2016-10-05 DIAGNOSIS — K652 Spontaneous bacterial peritonitis: Secondary | ICD-10-CM

## 2016-10-05 DIAGNOSIS — E876 Hypokalemia: Secondary | ICD-10-CM

## 2016-10-05 LAB — COMPREHENSIVE METABOLIC PANEL
ALBUMIN: 2.4 g/dL — AB (ref 3.5–5.0)
ALT: 61 U/L (ref 17–63)
AST: 88 U/L — AB (ref 15–41)
Alkaline Phosphatase: 86 U/L (ref 38–126)
Anion gap: 12 (ref 5–15)
BILIRUBIN TOTAL: 9.4 mg/dL — AB (ref 0.3–1.2)
BUN: 14 mg/dL (ref 6–20)
CHLORIDE: 99 mmol/L — AB (ref 101–111)
CO2: 19 mmol/L — ABNORMAL LOW (ref 22–32)
CREATININE: 0.84 mg/dL (ref 0.61–1.24)
Calcium: 7.8 mg/dL — ABNORMAL LOW (ref 8.9–10.3)
GFR calc Af Amer: >60 mL/min (ref >60)
GLUCOSE: 122 mg/dL — AB (ref 65–99)
Potassium: 3.4 mmol/L — ABNORMAL LOW (ref 3.5–5.1)
Sodium: 130 mmol/L — ABNORMAL LOW (ref 135–145)
Total Protein: 6.7 g/dL (ref 6.5–8.1)

## 2016-10-05 LAB — CBC
HCT: 26.5 % — ABNORMAL LOW (ref 39.0–52.0)
HEMOGLOBIN: 9 g/dL — AB (ref 13.0–17.0)
MCH: 32.1 pg (ref 26.0–34.0)
MCHC: 34 g/dL (ref 30.0–36.0)
MCV: 94.6 fL (ref 78.0–100.0)
Platelets: 178 10*3/uL (ref 150–400)
RBC: 2.8 MIL/uL — AB (ref 4.22–5.81)
RDW: 20.9 % — ABNORMAL HIGH (ref 11.5–15.5)
WBC: 20.5 10*3/uL — AB (ref 4.0–10.5)

## 2016-10-05 LAB — PROCALCITONIN: Procalcitonin: 0.27 ng/mL

## 2016-10-05 MED ORDER — POTASSIUM CHLORIDE CRYS ER 20 MEQ PO TBCR: 40.0000 meq | EXTENDED_RELEASE_TABLET | Freq: Once | ORAL | Status: DC

## 2016-10-05 MED ORDER — BOOST / RESOURCE BREEZE PO LIQD
1.0000 | Freq: Three times a day (TID) | ORAL | Status: DC
  Administered 2016-10-05 – 2016-10-12 (×16): 1 via ORAL

## 2016-10-05 MED ORDER — METOCLOPRAMIDE HCL 5 MG/ML IJ SOLN
20.0000 mg | Freq: Four times a day (QID) | INTRAVENOUS | Status: AC
  Administered 2016-10-05 – 2016-10-08 (×10): 20 mg via INTRAVENOUS
  Filled 2016-10-05 (×15): qty 4

## 2016-10-05 MED ORDER — PIPERACILLIN-TAZOBACTAM 3.375 G IVPB
3.3750 g | Freq: Three times a day (TID) | INTRAVENOUS | Status: AC
  Administered 2016-10-05 – 2016-10-09 (×13): 3.375 g via INTRAVENOUS
  Filled 2016-10-05 (×16): qty 50

## 2016-10-05 MED ORDER — DEXTROSE 5 % IV SOLN
1.0000 g | INTRAVENOUS | Status: DC
  Filled 2016-10-05: qty 10

## 2016-10-05 MED ORDER — METOCLOPRAMIDE HCL 5 MG/ML IJ SOLN
10.0000 mg | Freq: Four times a day (QID) | INTRAMUSCULAR | Status: DC
  Filled 2016-10-05: qty 2

## 2016-10-05 NOTE — Progress Notes (Signed)
PROGRESS NOTE    Ray Hayden  ZOX:[id number] DOB: [birth year] DOA: 09/21/2016 PCP: Marcelo Baldy, MD    Brief Narrative: 51 year old alcoholic male with cirrhosis who presents with UGI bleed due to varices that was banded.  Patient was intubated for alcohol withdrawal and EGD. He was extubated on 4/24 and precedex discontinued on 4/25. He was transferred to [facility] on 4/26. Since transfer to [facility], patient has been in out of confusion and encephalopathic. He has completed total of 9 days of IV antibiotics for possible aspiration pneumonia. In view of multiple admissions, non compliant to medications, alcohol abuse, palliative care consulted for goals of care. GI has been following the patient for upper GI bleed, ascites and encephalopathy.   Assessment & Plan:   Active Problems:   GI bleed   LFTs abnormal   Encephalopathy, hepatic (HCC)   Liver disease, chronic, with cirrhosis (HCC)   Respiratory failure (HCC)   Abdominal distension   Goals of care, counseling/discussion   Palliative care encounter   Variceal Upper GI bleed in cirrhotic with previous hx GI bleeds,  EGDs and banding of esophageal varices dating 12/2015 - 07/2016 at [facility].  EGD 4/18: grade 3 esophageal varices with active bleeding. Banded x 4.  Completed octreotide and is on PPI BID.  No improvement of coagulopathy with vitamin k.  Clinically resolved.  Anemia - Stable.  Alcoholic liver disease with cirrhosis and ascites, coagulopathy;  US paracentesis by IR on 4/26, only 2.1 liters removed, significant abd distention.  GI ordered abd film, for evaluation of ileus, it showed gaseous distention with ileus, later progressed to partial sbo, which improved spontaneously. Repeat abd film with chest shows moderate dilated air-filled small bowel loops with air throughout the colon.  Resume rifaximin and lactulose,lasix and aldactone. Remains confused .  GI follow-up appreciated. Discussed with Dr.Pyrtle: High  volume Paracentesis initially canceled on 5/1 due to concern for significantly distended small bowel related to ileus and the risk of perforation. Empirically starting on IV Zosyn for SBP & cover for PNA. He advised no rectal tube for now. GI have now ordered diagnostic paracentesis due to worsening leukocytosis.  Acute hepatic encephalopathy: still confused, resumed rifaximin and lactulose. Added enema lactulose by GI.  Trend ammonia levels. CT head >not a great study due to motion degradation but no acute abnormalities noted. Neurology was consulted due to concern for other causes of encephalopathy. Neurology consultation appreciated and suspect that his acute encephalopathy is likely toxic-metabolic and multifactorial in etiology and less likely due to Wernicke's encephalopathy or Korsakoff's syndrome. Getting MRI to rule out any pathology. Said to be improving.  Partial SBO/ileus:  Discussion as above.  ETOH dependence, with ETOH hepatitis and shock: improving.  Social worker consulted. PLAN FOR SNF on discharge.   Acute respiratory failure possibly from aspiration pneumonia.  Completed course of antibiotics for this indication. Elevated lactate likely related to his advanced liver disease.  Hypokalemia:  Replace and follow as needed.   Leukocytosis:  ? Aspiration, get procalcitonin levels > elevated 0.31>0.27.  Afebrile. Monitor.  Chest x-ray 10/05/16 showed improved left basilar subsegmental atelectasis or infiltrate. Right infrahilar atelectasis or infiltrate. Follow urine microscopy.  Adult failure to thrive inview of multiple co morbidities, multiple admissions, worsening liver issues, Dr. Blake Divine discussed with the brothers and called palliative care consult for goals of care .  Palliative care input appreciated. Discussed with them. They had recommended repeat paracentesis which is as discussed above. Morphine was changed to fentanyl when  necessary to try and minimize opioids in  system. For now, full scope of treatment. Overall prognosis poor.   DVT prophylaxis: SCD'S Code Status: full code.  Family Communication: None at bedside Disposition Plan: SNF on discharge,    Consultants:   Gastroenterology  PCCM.   Neurology   Procedures:  EGD on 4/18  Paracentesis   Antimicrobials:   zosyn completed 5 days, rocephin and zithromax. 4 days.   Now on IV Zosyn.   Subjective: Seen this morning. Appeared slightly confused. Oriented to self and place. Complaints of abdominal discomfort from distention. No chest pain, dyspnea or cough reported.  Objective: Vitals:   10/04/16 1337 10/04/16 2112 10/05/16 0500 10/05/16 1545  BP: (!) 110/58 122/78  (!) 117/52  Pulse: (!) 117 (!) 120  (!) 116  Resp: Temp: 99 F (37.2 C) 99.5 F (37.5 C)  98.5 F (36.9 C)  TempSrc: Oral Oral  Oral  SpO2: 90% 93%  94%  Weight:   102.8 kg (226 lb 10.1 oz)   Height:        Intake/Output Summary (Last 24 hours) at 10/05/16 1736 Last data filed at 10/05/16 1537  Gross per 24 hour  Intake             2303 ml  Output              350 ml  Net             1953 ml   Filed Weights   10/03/16 0438 10/04/16 0531 10/05/16 0500  Weight: 101.7 kg (224 lb 3.2 oz) 102.4 kg (225 lb 12 oz) 102.8 kg (226 lb 10.1 oz)    Examination:  General exam: Pleasant middle-aged male, moderately built and poorly nourished, chronically ill looking, lying comfortably propped up in bed. Scleral icterus. Respiratory system: Diminished breath sounds in the bases with occasional basal crackles but otherwise clear to auscultation. No increased work of breathing. Cardiovascular system: S1 & S2 heard, RRR.  2 +  pedal edema. Telemetry: Sinus tachycardia in the 110s. Gastrointestinal system: Abdomen markedly distended, tympanitic, nontender. No fluid thrill appreciated.diminished bowel sounds heard. Central nervous system: Alert and oriented 2. No focal neurological deficits. Skin: No  rashes, lesions or ulcers Psychiatry: Pleasant and answers questions appropriately.    Data Reviewed: I have personally reviewed following labs and imaging studies  CBC:  Recent Labs Lab 09/29/16 0858 10/04/16 0403 10/05/16 1228  WBC 10.8* 19.6* 20.5*  HGB 9.2* 9.0* 9.0*  HCT 27.8* 26.6* 26.5*  MCV 93.3 95.3 94.6  PLT 144* 164 178   Basic Metabolic Panel:  Recent Labs Lab 09/29/16 0858 09/29/16 1833 10/01/16 1410 10/05/16 1228  NA 139 139 138 130*  K 3.6 3.5 4.1 3.4*  CL 107 108 106 99*  CO2 23 23 18* 19*  GLUCOSE 89 101* 113* 122*  BUN CREATININE 0.75 0.76 0.84 0.84  CALCIUM 8.0* 8.0* 8.0* 7.8*  MG 1.9  --   --   --   PHOS 3.4  --   --   --    GFR: Estimated Creatinine Clearance: 129.1 mL/min (by C-G formula based on SCr of 0.84 mg/dL). Liver Function Tests:  Recent Labs Lab 10/03/16 0447 10/05/16 1228  AST 85* 88*  ALT 72* 61  ALKPHOS 80 86  BILITOT 8.0* 9.4*  PROT 6.5 6.7  ALBUMIN 2.5* 2.4*    Recent Labs Lab 09/30/16 0711 10/04/16 0403  AMMONIA 54* 56*  Coagulation Profile:  Recent Labs Lab 09/30/16 0711 10/04/16 0403  INR 2.18 2.35   CBG:  Recent Labs Lab 10/03/16 0047 10/03/16 0433 10/03/16 0802 10/03/16 1131 10/03/16 1622  GLUCAP 108* 99 104* 123* 103*   Sepsis Labs:  Recent Labs Lab 10/04/16 0701 10/04/16 1247 10/05/16 1228  PROCALCITON  --  0.31 0.27  LATICACIDVEN 2.4*  --   --     Recent Results (from the past 240 hour(s))  Culture, body fluid-bottle     Status: None   Collection Time: 09/29/16 12:14 PM  Result Value Ref Range Status   Specimen Description PERITONEAL  Final   Special Requests NONE  Final   Culture NO GROWTH 5 DAYS  Final   Report Status 10/04/2016 FINAL  Final  Gram stain     Status: None   Collection Time: 09/29/16 12:14 PM  Result Value Ref Range Status   Specimen Description PERITONEAL  Final   Special Requests NONE  Final   Gram Stain NO WBC SEEN NO ORGANISMS SEEN    Final   Report Status 09/29/2016 FINAL  Final         Radiology Studies: Dg Chest 2 View  Result Date: 10/05/2016 CLINICAL DATA:  Leukocytosis. EXAM: CHEST  2 VIEW COMPARISON:  Radiographs of Oct 04, 2016. FINDINGS: Stable cardiomediastinal silhouette. No pneumothorax is noted. Stable elevated right hemidiaphragm is noted. Right infrahilar atelectasis or infiltrate is noted. Mildly improved left basilar atelectasis or infiltrate is noted. Bony thorax is unremarkable. No significant pleural effusion is noted. IMPRESSION: Improved left basilar subsegmental atelectasis or infiltrate. Right infrahilar atelectasis or infiltrate. Electronically Signed   By: Lupita Raider, M.D.   On: 10/05/2016 15:30   Dg Abd 1 View  Result Date: 10/05/2016 CLINICAL DATA:  Persistent abdominal pain and distention for approximately 3 weeks EXAM: ABDOMEN - 1 VIEW COMPARISON:  Oct 04, 2016 FINDINGS: Mild generalized bowel dilatation remains. Air is noted in the rectum. There are no demonstrable air-fluid levels. No free air. No abnormal calcifications. Apparent rectal thermometer in place. IMPRESSION: Persistent bowel dilatation. Suspect ileus. Bowel obstruction felt to be less likely. No free air evident. Electronically Signed   By: Bretta Bang III M.D.   On: 10/05/2016 11:49   Ct Head W & Wo Contrast  Result Date: 10/04/2016 CLINICAL DATA:  Encephalopathy.  Cirrhosis. EXAM: CT HEAD WITHOUT AND WITH CONTRAST TECHNIQUE: Contiguous axial images were obtained from the base of the skull through the vertex without and with intravenous contrast CONTRAST:  75mL ISOVUE-300 IOPAMIDOL (ISOVUE-300) INJECTION 61% COMPARISON:  None. FINDINGS: Brain: Image quality degraded by extensive motion. Multiple repeat images due to motion. Mild atrophy. Negative for hydrocephalus. Negative for acute infarct. Negative for hemorrhage mass or edema. Normal enhancement postcontrast administration Vascular: Negative Skull: Negative Sinuses/Orbits:  Negative Other: None IMPRESSION: Image quality degraded by extensive motion No acute abnormality. Electronically Signed   By: Marlan Palau M.D.   On: 10/04/2016 11:38   Dg Abd Acute W/chest  Result Date: 10/04/2016 CLINICAL DATA:  Small bowel obstruction. Fever, aspiration pneumonia. EXAM: DG ABDOMEN ACUTE W/ 1V CHEST COMPARISON:  10/03/2016 and 10/02/2016, 09/30/2016 FINDINGS: Lungs are hypoinflated with minimal left base opacification likely atelectasis, although cannot exclude infection. Cardiomediastinal silhouette and remainder of the chest is unchanged. Abdominal films demonstrate several air-filled dilated small bowel loops measuring up to 6.2 cm in diameter without significant change. There is moderate air throughout the colon. Small caliber catheter over the rectum. Remainder of the exam is  unchanged. IMPRESSION: No significant change in moderate dilated air-filled small bowel loops with air throughout the colon. Hypoinflated lungs with mild left base opacification likely atelectasis, although cannot exclude infection. Electronically Signed   By: Elberta Fortis M.D.   On: 10/04/2016 14:39        Scheduled Meds: . feeding supplement  1 Container Oral TID BM  . feeding supplement (ENSURE ENLIVE)  237 mL Oral BID BM  . folic acid  1 mg Oral Daily  . furosemide  40 mg Intravenous BID  . lactulose  20 g Oral TID  . lactulose  300 mL Rectal BID  . mouth rinse  15 mL Mouth Rinse BID  . multivitamin with minerals  1 tablet Oral Daily  . nicotine  21 mg Transdermal Daily  . pantoprazole  40 mg Oral Daily  . rifaximin  550 mg Oral BID  . sodium chloride flush  3 mL Intravenous Q12H  . spironolactone  150 mg Oral Daily  . thiamine injection  250 mg Intravenous Daily   Continuous Infusions: . metoCLOPramide (REGLAN) injection Stopped (10/05/16 1317)     LOS: 14 days    Time spent: 35 minutes.    Marcellus Scott, MD, FACP, FHM. [facility] Pager [phone]  If 7PM-7AM, please  contact night-coverage [email or web address] Password [email or web address] 10/05/2016, 5:54 PM

## 2016-10-05 NOTE — Progress Notes (Signed)
Pharmacy Antibiotic Note  Ray Hayden is a 51 y.o. male admitted on 09/21/2016 with pneumonia, intra-abdominal infection.  Pharmacy has been consulted for Zosyn dosing.  Plan: 1. Start Zosyn 3.375g IV q 8 hrs (4 hr extended interval infusion). 2. F/u renal function, cultures and clinical course.  Height: 6' (182.9 cm) Weight: 226 lb 10.1 oz (102.8 kg) IBW/kg (Calculated) : 77.6  Temp (24hrs), Avg:99 F (37.2 C), Min:98.5 F (36.9 C), Max:99.5 F (37.5 C)   Recent Labs Lab 09/29/16 0858 09/29/16 1833 10/01/16 1410 10/04/16 0403 10/04/16 0701 10/05/16 1228  WBC 10.8*  --   --  19.6*  --  20.5*  CREATININE 0.75 0.76 0.84  --   --  0.84  LATICACIDVEN  --   --   --   --  2.4*  --     Estimated Creatinine Clearance: 129.1 mL/min (by C-G formula based on SCr of 0.84 mg/dL).    Allergies  Allergen Reactions  . Sulfa Antibiotics     Antimicrobials this admission: 4/19 zosyn>>4/23 4/18 CTX >> 4/19, 4/26 >>4/29 4/26 Azithro >> 4/29 4/24 rifaximin >  Dose adjustments this admission:   Microbiology results: 4/19 resp- GPC/GPR, GN coccobacilli, few candida albicans (F) 4/18 MRSA PCR - neg 4/26 peritoneal: neg  Thank you for allowing pharmacy to be a part of this patient's care.  Gardner Candle 10/05/2016 6:29 PM

## 2016-10-05 NOTE — Consult Note (Signed)
NEURO HOSPITALIST CONSULT NOTE   Requestig physician: Dr. Waymon Amato   Reason for Consult: AMS possible Wernicke    History obtained from:  chart  HPI:                                                                                                                                          Ray Hayden is an 51 y.o. male with hx of ETOH with cirrhosis with ascites and varices presented with vomiting blood. BP 70/40 at arrival of EMS. Patient found to have Three columns of oozing grade III varices were found in the lower third  of the esophagus with upper GI.  Four bands were  successfully placed with complete eradication, resulting in deflation of  varices. Bleeding had stopped at the end of the procedure. patient also was found to have PNA and has been on 9 days ABX.  He remained encephalopathic through hospital stay. Ammonia levels have trended down from 88 to 56 from admission. AST and ALT has also trended down from 633/182 to 85/ 72. A head CT was obtained but was very motion degraded.   Currently patient is awake and alert. He is able to give a good history. He states that he has been drinking since he was early teens. As worst he was drinking at least a pint to a fifth a day which could be more. Patient is able to give me a good history and I did not sense any confabulation. Daughter-in-law who is in the bedroom says he is better today than he has been however looking at his ammonia and his other labs they have also improved.  Past Medical History:  Diagnosis Date  . Cirrhosis (HCC)   . Hypertension     Past Surgical History:  Procedure Laterality Date  . ESOPHAGOGASTRODUODENOSCOPY N/A 09/21/2016   Procedure: ESOPHAGOGASTRODUODENOSCOPY (EGD);  Surgeon: Sherrilyn Rist, MD;  Location: [facility] ENDOSCOPY;  Service: Endoscopy;  Laterality: N/A;  . IR PARACENTESIS  09/29/2016    History reviewed. No pertinent family history.   Social History:  reports that he has been  smoking Cigarettes.  He has never used smokeless tobacco. He reports that he drinks alcohol. He reports that he does not use drugs.  Allergies  Allergen Reactions  . Sulfa Antibiotics     MEDICATIONS:  Prior to Admission:  Prescriptions Prior to Admission  Medication Sig Dispense Refill Last Dose  . busPIRone (BUSPAR) 15 MG tablet Take 15 mg by mouth daily.    09/20/2016 at Unknown time  . carbamazepine (TEGRETOL) 200 MG tablet Take 100-200 mg by mouth as directed. TAPER  BID 4-16 to 4-18 (Pt has already had 5 doses)  BID 4-19 to 4-21  Daily 4-22 to 4-29   09/20/2016 at pm  . furosemide (LASIX) 40 MG tablet Take 40 mg by mouth daily.   09/20/2016 at Unknown time  . gabapentin (NEURONTIN) 300 MG capsule Take 300 mg by mouth 3 (three) times daily.   09/20/2016 at Unknown time  . lactulose (CHRONULAC) 10 GM/15ML solution Take 10 g by mouth 2 (two) times daily as needed for mild constipation.   09/20/2016 at Unknown time  . magnesium oxide (MAG-OX) 400 MG tablet Take 400 mg by mouth 2 (two) times daily.   09/20/2016 at Unknown time  . Melatonin 3 MG TABS Take 1 tablet by mouth at bedtime.   09/20/2016 at 2100  . Multiple Vitamin (MULTIVITAMIN WITH MINERALS) TABS tablet Take 1 tablet by mouth daily.   09/20/2016 at Unknown time  . pantoprazole (PROTONIX) 40 MG tablet Take 40 mg by mouth 2 (two) times daily.    09/20/2016 at 0900  . spironolactone (ALDACTONE) 100 MG tablet Take 100 mg by mouth daily.   09/20/2016 at Unknown time  . thiamine (VITAMIN B-1) 100 MG tablet Take 100 mg by mouth daily.   09/20/2016 at Unknown time  . LORazepam (ATIVAN PO) Take 0.5-2 mg by mouth. Pt completed a taper from  to 0.5mg  from 4-14 to 4-15   Completed Course at Unknown time  . venlafaxine XR (EFFEXOR-XR) 150 MG 24 hr capsule Take 150 mg by mouth daily with breakfast.   Not Taking at Unknown  time   Scheduled: . feeding supplement  1 Container Oral TID BM  . feeding supplement (ENSURE ENLIVE)  237 mL Oral BID BM  . folic acid  1 mg Oral Daily  . furosemide  40 mg Intravenous BID  . lactulose  20 g Oral TID  . lactulose  300 mL Rectal BID  . mouth rinse  15 mL Mouth Rinse BID  . multivitamin with minerals  1 tablet Oral Daily  . nicotine  21 mg Transdermal Daily  . pantoprazole  40 mg Oral Daily  . rifaximin  550 mg Oral BID  . sodium chloride flush  3 mL Intravenous Q12H  . spironolactone  150 mg Oral Daily  . thiamine injection  250 mg Intravenous Daily     ROS:                                                                                                                                       History obtained from the patient  General ROS: negative for -  chills, fatigue, fever, night sweats, weight gain or weight loss Psychological ROS: negative for - behavioral disorder, hallucinations, memory difficulties, mood swings or suicidal ideation Ophthalmic ROS: negative for - blurry vision, double vision, eye pain or loss of vision ENT ROS: negative for - epistaxis, nasal discharge, oral lesions, sore throat, tinnitus or vertigo Allergy and Immunology ROS: negative for - hives or itchy/watery eyes Hematological and Lymphatic ROS: negative for - bleeding problems, bruising or swollen lymph nodes Endocrine ROS: negative for - galactorrhea, hair pattern changes, polydipsia/polyuria or temperature intolerance Respiratory ROS: negative for - cough, hemoptysis, shortness of breath or wheezing Cardiovascular ROS: negative for - chest pain, dyspnea on exertion, edema or irregular heartbeat Gastrointestinal ROS: negative for - abdominal pain, diarrhea, hematemesis, nausea/vomiting or stool incontinence Genito-Urinary ROS: negative for - dysuria, hematuria, incontinence or urinary frequency/urgency Musculoskeletal ROS: negative for - joint swelling or muscular weakness Neurological  ROS: as noted in HPI Dermatological ROS: negative for rash and skin lesion changes   Blood pressure 122/78, pulse (!) 120, temperature 99.5 F (37.5 C), temperature source Oral, resp. rate 20, height 6' (1.829 m), weight 102.8 kg (226 lb 10.1 oz), SpO2 93 %.   Neurologic Examination:                                                                                                      HEENT-  Normocephalic, no lesions, without obvious abnormality.  Normal external eye and conjunctiva.  Normal TM's bilaterally.  Normal auditory canals and external ears. Normal external nose, mucus membranes and septum.  Normal pharynx. Cardiovascular- S1, S2 normal, pulses palpable throughout   Lungs- chest clear, no wheezing, rales, normal symmetric air entry Abdomen- normal findings: bowel sounds normal Extremities- no edema Lymph-no adenopathy palpable Musculoskeletal-no joint tenderness, deformity or swelling Skin-warm and dry, no hyperpigmentation, vitiligo, or suspicious lesions  Neurological Examination Mental Status: Alert, oriented, to person, place, year, and date. Speech fluent without evidence of aphasia.  Able to follow 3 step commands without difficulty. Patient shows no confabulation, he is able to give a good history detailing back into his teen years. Per his daughter-in-law is true also. Cranial Nerves: II: ; Visual fields grossly normal, pupils equal, round, reactive to light and accommodation III,IV, VI: ptosis not present, extra-ocular motions intact bilaterally V,VII: smile symmetric, facial light touch sensation normal bilaterally VIII: hearing normal bilaterally IX,X: uvula rises symmetrically XI: bilateral shoulder shrug XII: midline tongue extension Motor: Patient is diffusely weak with 3/5 strength throughout. Sensory: Pinprick and light touch intact throughout, bilaterally Deep Tendon Reflexes: 2+ and symmetric throughout Plantars: Right: downgoing   Left:  downgoing Cerebellar: normal finger-to-nose,  Gait: Not tested      Lab Results: Basic Metabolic Panel:  Recent Labs Lab 09/29/16 0858 09/29/16 1833 10/01/16 1410  NA 139 139 138  K 3.6 3.5 4.1  CL 107 108 106  CO2 23 23 18*  GLUCOSE 89 101* 113*  BUN CREATININE 0.75 0.76 0.84  CALCIUM 8.0* 8.0* 8.0*  MG 1.9  --   --   PHOS  3.4  --   --     Liver Function Tests:  Recent Labs Lab 10/03/16 0447  AST 85*  ALT 72*  ALKPHOS 80  BILITOT 8.0*  PROT 6.5  ALBUMIN 2.5*   No results for input(s): LIPASE, AMYLASE in the last 168 hours.  Recent Labs Lab 09/30/16 0711 10/04/16 0403  AMMONIA 54* 56*    CBC:  Recent Labs Lab 09/29/16 0858 10/04/16 0403  WBC 10.8* 19.6*  HGB 9.2* 9.0*  HCT 27.8* 26.6*  MCV 93.3 95.3  PLT 144* 164    Cardiac Enzymes: No results for input(s): CKTOTAL, CKMB, CKMBINDEX, TROPONINI in the last 168 hours.  Lipid Panel: No results for input(s): CHOL, TRIG, HDL, CHOLHDL, VLDL, LDLCALC in the last 168 hours.  CBG:  Recent Labs Lab 10/03/16 0047 10/03/16 0433 10/03/16 0802 10/03/16 1131 10/03/16 1622  GLUCAP 108* 99 104* 123* 103*    Microbiology: Results for orders placed or performed during the hospital encounter of 09/21/16  MRSA PCR Screening     Status: None   Collection Time: 09/21/16 12:24 PM  Result Value Ref Range Status   MRSA by PCR NEGATIVE NEGATIVE Final    Comment:        The GeneXpert MRSA Assay (FDA approved for NASAL specimens only), is one component of a comprehensive MRSA colonization surveillance program. It is not intended to diagnose MRSA infection nor to guide or monitor treatment for MRSA infections.   Culture, respiratory (NON-Expectorated)     Status: None   Collection Time: 09/22/16 11:38 AM  Result Value Ref Range Status   Specimen Description TRACHEAL ASPIRATE  Final   Special Requests NONE  Final   Gram Stain   Final    MODERATE WBC PRESENT, PREDOMINANTLY PMN FEW  YEAST FEW GRAM POSITIVE COCCI RARE GRAM POSITIVE RODS FEW GRAM NEGATIVE COCCOBACILLI    Culture FEW CANDIDA ALBICANS  Final   Report Status 09/24/2016 FINAL  Final  Culture, body fluid-bottle     Status: None   Collection Time: 09/29/16 12:14 PM  Result Value Ref Range Status   Specimen Description PERITONEAL  Final   Special Requests NONE  Final   Culture NO GROWTH 5 DAYS  Final   Report Status 10/04/2016 FINAL  Final  Gram stain     Status: None   Collection Time: 09/29/16 12:14 PM  Result Value Ref Range Status   Specimen Description PERITONEAL  Final   Special Requests NONE  Final   Gram Stain NO WBC SEEN NO ORGANISMS SEEN   Final   Report Status 09/29/2016 FINAL  Final    Coagulation Studies:  Recent Labs  10/04/16 0403  LABPROT 26.2*  INR 2.35    Imaging: Ct Head W & Wo Contrast  Result Date: 10/04/2016 CLINICAL DATA:  Encephalopathy.  Cirrhosis. EXAM: CT HEAD WITHOUT AND WITH CONTRAST TECHNIQUE: Contiguous axial images were obtained from the base of the skull through the vertex without and with intravenous contrast CONTRAST:  75mL ISOVUE-300 IOPAMIDOL (ISOVUE-300) INJECTION 61% COMPARISON:  None. FINDINGS: Brain: Image quality degraded by extensive motion. Multiple repeat images due to motion. Mild atrophy. Negative for hydrocephalus. Negative for acute infarct. Negative for hemorrhage mass or edema. Normal enhancement postcontrast administration Vascular: Negative Skull: Negative Sinuses/Orbits: Negative Other: None IMPRESSION: Image quality degraded by extensive motion No acute abnormality. Electronically Signed   By: Marlan Palau M.D.   On: 10/04/2016 11:38   Dg Abd Acute W/chest  Result Date: 10/04/2016 CLINICAL DATA:  Small bowel  obstruction. Fever, aspiration pneumonia. EXAM: DG ABDOMEN ACUTE W/ 1V CHEST COMPARISON:  10/03/2016 and 10/02/2016, 09/30/2016 FINDINGS: Lungs are hypoinflated with minimal left base opacification likely atelectasis, although cannot  exclude infection. Cardiomediastinal silhouette and remainder of the chest is unchanged. Abdominal films demonstrate several air-filled dilated small bowel loops measuring up to 6.2 cm in diameter without significant change. There is moderate air throughout the colon. Small caliber catheter over the rectum. Remainder of the exam is unchanged. IMPRESSION: No significant change in moderate dilated air-filled small bowel loops with air throughout the colon. Hypoinflated lungs with mild left base opacification likely atelectasis, although cannot exclude infection. Electronically Signed   By: Elberta Fortis M.D.   On: 10/04/2016 14:39       Assessment and plan per attending neurologist  Felicie Morn PA-C [facility] [phone]  10/05/2016, 10:46 AM   Assessment/Plan:  This is a 51 year old male sent in to [facility] with EtOH cirrhosis and ascites, vomiting blood with initial blood pressure of 70/40. Found to have grade 3 varices which were successfully treated. Ammonia levels elevated up to 88. As patient's metabolic abnormalities have improved his encephalopathy has also. At this point do not believe this represents a 1 acute encephalopathy. Most likely this is a metabolic encephalopathy seeing he has many issues present, shows no confabulation and no nystagmus that would be consistent with Wernicke's encephalopathy.  Recommend: Continuing to treat patient's hepatic issues, pneumonia, and other metabolic issues. At this time do not believe a EEG is warranted however would obtain an MRI of brain without contrast. He had trouble getting a CT secondary to excessive movement. I believe at this time he will remain still.

## 2016-10-05 NOTE — Progress Notes (Signed)
Daily Progress Note   Patient Name: Ray Hayden       Date: 10/05/2016 DOB: July 16, 1965  Age: 51 y.o. MRN#: [id number] Attending Physician: Modena Jansky, MD Primary Care Physician: Dorris Fetch, MD Admit Date: 09/21/2016  Reason for Consultation/Follow-up: Palliative care consulted to assist with [place] and decision making with family d/t worsening liver cirrhosis, poor improvement, and poor prognosis.    Subjective: Ray Hayden is lying in bed, doesn't appear as uncomfortable as yesterday but still has abd and back pain. More alert and oriented.   Length of Stay: 14  Current Medications: Scheduled Meds:  . feeding supplement (ENSURE ENLIVE)  237 mL Oral BID BM  . folic acid  1 mg Oral Daily  . furosemide  40 mg Intravenous BID  . lactulose  20 g Oral TID  . lactulose  300 mL Rectal BID  . mouth rinse  15 mL Mouth Rinse BID  . multivitamin with minerals  1 tablet Oral Daily  . nicotine  21 mg Transdermal Daily  . pantoprazole  40 mg Oral Daily  . rifaximin  550 mg Oral BID  . sodium chloride flush  3 mL Intravenous Q12H  . spironolactone  150 mg Oral Daily  . thiamine injection  250 mg Intravenous Daily    Continuous Infusions:   PRN Meds: acetaminophen, fentaNYL (SUBLIMAZE) injection, sodium chloride flush  Physical Exam  Constitutional: Ray Hayden is oriented to person, place, and time. Ray Hayden appears well-developed.  Cardiovascular: Tachycardia present.   Pulmonary/Chest: No accessory muscle usage. Tachypnea noted. Ray Hayden is in respiratory distress.  Mild distress and tachypnea mostly from distended abd  Abdominal: Ray Hayden exhibits distension and ascites.  Neurological: Ray Hayden is alert and oriented to person, place, and time.  Much more oriented today. Confused a little at times in conversation but has  good insight and understanding of his situation.   Nursing note and vitals reviewed.           Vital Signs: BP 122/78 (BP Location: Right Arm)   Pulse (!) 120   Temp 99.5 F (37.5 C) (Oral)   Resp 20   Ht 6' (1.829 m)   Wt 102.8 kg (226 lb 10.1 oz)   SpO2 93%   BMI 30.74 kg/m  SpO2: SpO2: 93 % O2 Device: O2 Device: Not Delivered O2 Flow Rate: O2 Flow  Rate (L/min): 2 L/min  Intake/output summary:  Intake/Output Summary (Last 24 hours) at 10/05/16 1010 Last data filed at 10/05/16 1610  Gross per 24 hour  Intake             1412 ml  Output              800 ml  Net              612 ml   LBM: Last BM Date: 10/05/16 Baseline Weight: Weight: 83.9 kg (185 lb) Most recent weight: Weight: 102.8 kg (226 lb 10.1 oz)       Palliative Assessment/Data:    Flowsheet Rows     Most Recent Value  Intake Tab  Referral Department  Hospitalist  Unit at Time of Referral  Med/Surg Unit  Palliative Care Primary Diagnosis  Other (Comment) [GI]  Date Notified  10/02/16  Palliative Care Type  New Palliative care  Reason for referral  Clarify Goals of Care  Date of Admission  09/21/16  Date first seen by Palliative Care  10/02/16  # of days Palliative referral response time  0 Day(s)  # of days IP prior to Palliative referral  11  Clinical Assessment  Psychosocial & Spiritual Assessment  Palliative Care Outcomes      Patient Active Problem List   Diagnosis Date Noted  . Goals of care, counseling/discussion   . Palliative care encounter   . Abdominal distension   . Liver disease, chronic, with cirrhosis ([place])   . Respiratory failure ([place])   . Encephalopathy, hepatic ([place])   . LFTs abnormal   . GI bleed 09/21/2016  . Hematemesis   . Acute blood loss anemia   . Alcoholic cirrhosis of liver with ascites ([place])   . Ascites due to alcoholic cirrhosis ([place])   . Coagulopathy ([place])   . Hypovolemic shock ([place])   . Lactic acidosis   . Bleeding esophageal varices (HCC)     Palliative  Care Assessment & Plan   HPI: 51 year old male with alcoholic cirrhosis who presents with UGI bleed due to varices that was banded. Patient was intubated for withdrawal and EGD ON 4/18. Ray Hayden was extubated on 4/24 and precedex discontinued on 4/25. Paracentesis removed 2.1L on 4/26 with no signs of SBP. Continues to have hepatic encephalopathy. Complicated by ileus. Previous h/o esophageal varice banding 12/2015 and 07/2016 with recurrent bleeding. Palliative care consulted to assist with [place] and decision making with family d/t worsening liver cirrhosis, poor improvement, and poor prognosis.   Assessment: I met again today with Ray Hayden. Ray Hayden understands that "my liver is shot." Ray Hayden speaks openly about struggling with sobriety even though Ray Hayden has good support from family, friends, and coworkers. Ray Hayden understands that his liver damage is irreversible from damage already done but that Ray Hayden can help himself by not providing more damage with continued alcohol abuse. Ray Hayden is motivated. At this time his sister-in-law Lelan Pons (retired Immunologist) came in to visit. She is very supportive and encouraged Ray Hayden that Ray Hayden seems much better and more oriented today and we discuss taking one day at a time. They continue to work on some of his financial beneficiary paperwork as this is very important to [employer].   Recommendations/Plan:  Consider repeat paracentesis but may be mostly air in colon  Consider rectal tube for decompression  Continue fentanyl prn for pain.   Goals of Care and Additional Recommendations:  Limitations on Scope of Treatment: Full Scope Treatment  Code Status:  Full  code  Prognosis:   Unable to determine but overall prognosis poor with severe cirrhosis.   Discharge Planning:  To Be Determined. Likely SNF rehab.    Thank you for allowing the Palliative Medicine Team to assist in the care of this patient.   Total Time 42mn Prolonged Time Billed  no       Greater than 50%  of this time was spent  counseling and coordinating care related to the above assessment and plan.  AVinie Sill NP Palliative Medicine Team Pager # [phone](M-F 8a-5p) Team Phone # [phone](Nights/Weekends)

## 2016-10-05 NOTE — Progress Notes (Signed)
Physical Therapy Treatment Patient Details Name: Ray Hayden MRN: [id number] DOB: 07-14-1965 Today's Date: 10/05/2016    History of Present Illness 51 year old alcoholic male adm 54/49/2010 with UGI bleed due to varices that was banded. Patient vomiting blood & intubated for airway protection in setting of ETOH withdrawal; He was extubated on 4/24; PMHx: cirrhosis, ETOH, smoker, HTN    PT Comments    Much improved.  Progressing toward gait training with emphasis on transfer safety and stamina/strengthen with standing/gait activity.   Follow Up Recommendations  SNF     Equipment Recommendations  Other (comment) (TBD next venue.)    Recommendations for Other Services       Precautions / Restrictions Restrictions Weight Bearing Restrictions: No    Mobility  Bed Mobility Overal bed mobility: Needs Assistance Bed Mobility: Supine to Sit     Supine to sit: Min assist     General bed mobility comments: pt bridged to EOB and needed min truncal assist to come up and forward onto R elbow  Transfers Overall transfer level: Needs assistance   Transfers: Sit to/from Stand Sit to Stand: Min assist;Min guard;+2 safety/equipment         General transfer comment: stability assist at first then only cuing for hand placement standing and sitting.  Ambulation/Gait Ambulation/Gait assistance: Min assist;Min guard Ambulation Distance (Feet): 22 Feet (then 26 feet and finally 48 feet with rest in between.) Assistive device: Rolling walker (2 wheeled) Gait Pattern/deviations: Step-through pattern   Gait velocity interpretation: Below normal speed for age/gender General Gait Details: short, low amplitude steps, mostly with knees flexed and moderately heavy use of the RW   Stairs            Wheelchair Mobility    Modified Rankin (Stroke Patients Only)       Balance Overall balance assessment: Needs assistance Sitting-balance support: Feet supported Sitting balance-Leahy  Scale: Good     Standing balance support: Bilateral upper extremity supported Standing balance-Leahy Scale: Poor Standing balance comment: can maintain stance with light use of the RW and no assist                            Cognition Arousal/Alertness: Awake/alert Behavior During Therapy: WFL for tasks assessed/performed Overall Cognitive Status: Within Functional Limits for tasks assessed                                        Exercises General Exercises - Lower Extremity Heel Slides: AROM;Strengthening;Both;10 reps;Supine (graded resistance--warm up prior to mobility.)    General Comments General comments (skin integrity, edema, etc.): During gait sats at 92% on RA and EHR 120 bpm      Pertinent Vitals/Pain Pain Assessment: Faces Faces Pain Scale: Hurts a little bit Pain Location: stomach Pain Descriptors / Indicators: Discomfort Pain Intervention(s): Monitored during session    Home Living                      Prior Function            PT Goals (current goals can now be found in the care plan section) Acute Rehab PT Goals Patient Stated Goal: per pt family--rehab at SNF PT Goal Formulation: With family Time For Goal Achievement: 10/14/16 Potential to Achieve Goals: Fair Progress towards PT goals: Progressing toward goals;Goals met and updated -  see care plan    Frequency    Min 3X/week      PT Plan Current plan remains appropriate    Co-evaluation              AM-PAC PT "6 Clicks" Daily Activity  Outcome Measure  Difficulty turning over in bed (including adjusting bedclothes, sheets and blankets)?: Total Difficulty moving from lying on back to sitting on the side of the bed? : Total Difficulty sitting down on and standing up from a chair with arms (e.g., wheelchair, bedside commode, etc,.)?: Total Help needed moving to and from a bed to chair (including a wheelchair)?: A Little Help needed walking in hospital  room?: A Little Help needed climbing 3-5 steps with a railing? : A Lot 6 Click Score: 11    End of Session   Activity Tolerance: Patient tolerated treatment well Patient left: in chair;with call bell/phone within reach;with family/visitor present Nurse Communication: Mobility status PT Visit Diagnosis: Muscle weakness (generalized) (M62.81);Unsteadiness on feet (R26.81);Other abnormalities of gait and mobility (R26.89)     Time: 9978-0208 PT Time Calculation (min) (ACUTE ONLY): 42 min  Charges:  $Gait Training: 8-22 mins $Therapeutic Activity: 23-37 mins                    G Codes:       07-Oct-2016  Ray Hayden, PT [phone]  (pager)   Ray Hayden 10-07-16, 12:49 PM

## 2016-10-05 NOTE — Progress Notes (Signed)
Nutrition Follow Up  DOCUMENTATION CODES:   Obesity unspecified  INTERVENTION:    Ensure Enlive po BID, each supplement provides 350 kcal and 20 grams of protein   Boost Breeze po TID, each supplement provides 250 kcal and 9 grams of protein   Recommend nutrition support if continued poor oral intake as appropriate for goals of care.   NEW NUTRITION DIAGNOSIS:   Increased nutrient needs related to acute illness, chronic illness as evidenced by increased estimated needs from protein. ongoing  GOAL:   Patient will meet greater than or equal to 90% of their needs  MONITOR:   PO intake, Supplement acceptance, Labs, Skin, I & O's  ASSESSMENT:   51 year old alcoholic male with cirrhosis who presents with UGI bleed due to varices that was banded.  Now with ileus   4/18- EGD and esophageal banding 4/24- Patient extubated  4/26- paracentesis- removal of 2.1L 4/27- advanced to DYS 3/thins, pt noted to have abdominal distension 4/28- pt noted to have ileus and possible SBO 4/30- KUB reports ileus resolving-pt advanced to clear liquids  Pt currently eating 0% clear liquids and with worsening mental status and increased confusion. Palliative care consulted and pt remains full scope of care despite poor prognosis. RD will order Boost Breeze until diet advanced. Continue to encourage intakes. If continued poor oral intake recommend nutrition support pending goals of care.    Medications reviewed and include: folic acid, lasix, lactulose, reglan, MVI, nicotine, protonix, aldactone, thiamine  Labs reviewed: wbc 19.6, Hgb 9.0(L), Hct 26.6(L)  Diet Order:  Diet clear liquid Room service appropriate? Yes; Fluid consistency: Thin  Skin:  Reviewed, no issues  Last BM:  5/2  Height:   Ht Readings from Last 1 Encounters:  09/28/16 6' (1.829 m)   Weight:   Wt Readings from Last 1 Encounters:  10/05/16 226 lb 10.1 oz (102.8 kg)   Ideal Body Weight:  81 kg  BMI:  Body mass index  is 30.74 kg/m.  Estimated Nutritional Needs:   Kcal:  2300-2500  Protein:  125-135 gm  Fluid:  2.3-2.5 L  EDUCATION NEEDS:   No education needs identified at this time  Betsey Holiday, RD, LDN Pager #- [phone]

## 2016-10-05 NOTE — Progress Notes (Signed)
Daily Rounding Note  10/05/2016, 10:28 AM  LOS: 14 days   SUBJECTIVE:   Chief complaint:   Still distended in abdomen and uncomfortable  Periods of confusion and inappropriate behavior intermittently.   OBJECTIVE:         Vital signs in last 24 hours:    Temp:  [99 F (37.2 C)-99.5 F (37.5 C)] 99.5 F (37.5 C) (05/01 2112) Pulse Rate:  [117-120] 120 (05/01 2112) Resp:  [18-20] 20 (05/01 2112) BP: (110-122)/(58-78) 122/78 (05/01 2112) SpO2:  [90 %-93 %] 93 % (05/01 2112) Weight:  [102.8 kg (226 lb 10.1 oz)] 102.8 kg (226 lb 10.1 oz) (05/02 0500) Last BM Date: 10/05/16 Filed Weights   10/03/16 0438 10/04/16 0531 10/05/16 0500  Weight: 101.7 kg (224 lb 3.2 oz) 102.4 kg (225 lb 12 oz) 102.8 kg (226 lb 10.1 oz)   General: looks about the same.  Alert, comfortable.     Heart: RRR, tachy Chest: clear.  No labored breathing Abdomen: distended, tense, NT.  BS scant.    Extremities: pedal edema.  Pressure prevention booties in place Neuro/Psych:  Oriented x 3.  Moving all limbs, some UE tremor though no true asterixis.  Flat affect.  Psycho-motor retardation improved.   Intake/Output from previous day: 05/01 0701 - 05/02 0700 In: 1232 [P.O.:1232] Out: 800 [Urine:800]  Intake/Output this shift: Total I/O In: 560 [P.O.:560] Out: -   Lab Results:  Recent Labs  10/04/16 0403  WBC 19.6*  HGB 9.0*  HCT 26.6*  PLT 164   BMET No results for input(s): NA, K, CL, CO2, GLUCOSE, BUN, CREATININE, CALCIUM in the last 72 hours. LFT  Recent Labs  10/03/16 0447  PROT 6.5  ALBUMIN 2.5*  AST 85*  ALT 72*  ALKPHOS 80  BILITOT 8.0*  BILIDIR 3.4*  IBILI 4.6*   PT/INR  Recent Labs  10/04/16 0403  LABPROT 26.2*  INR 2.35   Hepatitis Panel No results for input(s): HEPBSAG, HCVAB, HEPAIGM, HEPBIGM in the last 72 hours.  Studies/Results: Ct Head W & Wo Contrast  Result Date: 10/04/2016 CLINICAL DATA:   Encephalopathy.  Cirrhosis. EXAM: CT HEAD WITHOUT AND WITH CONTRAST TECHNIQUE: Contiguous axial images were obtained from the base of the skull through the vertex without and with intravenous contrast CONTRAST:  75mL ISOVUE-300 IOPAMIDOL (ISOVUE-300) INJECTION 61% COMPARISON:  None. FINDINGS: Brain: Image quality degraded by extensive motion. Multiple repeat images due to motion. Mild atrophy. Negative for hydrocephalus. Negative for acute infarct. Negative for hemorrhage mass or edema. Normal enhancement postcontrast administration Vascular: Negative Skull: Negative Sinuses/Orbits: Negative Other: None IMPRESSION: Image quality degraded by extensive motion No acute abnormality. Electronically Signed   By: Marlan Palau M.D.   On: 10/04/2016 11:38   Dg Abd Acute W/chest  Result Date: 10/04/2016 CLINICAL DATA:  Small bowel obstruction. Fever, aspiration pneumonia. EXAM: DG ABDOMEN ACUTE W/ 1V CHEST COMPARISON:  10/03/2016 and 10/02/2016, 09/30/2016 FINDINGS: Lungs are hypoinflated with minimal left base opacification likely atelectasis, although cannot exclude infection. Cardiomediastinal silhouette and remainder of the chest is unchanged. Abdominal films demonstrate several air-filled dilated small bowel loops measuring up to 6.2 cm in diameter without significant change. There is moderate air throughout the colon. Small caliber catheter over the rectum. Remainder of the exam is unchanged. IMPRESSION: No significant change in moderate dilated air-filled small bowel loops with air throughout the colon. Hypoinflated lungs with mild left base opacification likely atelectasis, although cannot exclude infection. Electronically Signed  By: Elberta Fortis M.D.   On: 10/04/2016 14:39   Scheduled Meds: . feeding supplement (ENSURE ENLIVE)  237 mL Oral BID BM  . folic acid  1 mg Oral Daily  . furosemide  40 mg Intravenous BID  . lactulose  20 g Oral TID  . lactulose  300 mL Rectal BID  . mouth rinse  15 mL Mouth  Rinse BID  . multivitamin with minerals  1 tablet Oral Daily  . nicotine  21 mg Transdermal Daily  . pantoprazole  40 mg Oral Daily  . rifaximin  550 mg Oral BID  . sodium chloride flush  3 mL Intravenous Q12H  . spironolactone  150 mg Oral Daily  . thiamine injection  250 mg Intravenous Daily   Continuous Infusions: PRN Meds:.acetaminophen, fentaNYL (SUBLIMAZE) injection, sodium chloride flush  ASSESMENT:   *  Encephalopathy, multifactorial.  HE with mild elevation Ammonia, covered with Lactulose PO and PR as well as Rifaximin.  Not able to retain full volume of enemas however.   Ativan discontinued, no doses in 4 days.  Received Fentanyl x 1 yesterday (per palliative care).   Head CT unremarkable/unrevealing but motion compromised.  Day 3 daily 250 mg IV Thiamine, but had received 100 mg IV daily 4/18 - 4/24.     *  Ileus.  SB and colon.  Ileus not ascites is source to abd distention.  With lactulose enemas is getting distal colonic stimulation.  *  Leukocytosis (lab not repeated today).  ? Source.  Note elevated Lactate.  Latest U/A 4/18 (negative).  Latest CXR 4/27: atx vs consolidation.   *  Variceal bleed.  4/18 EGD: grade 3, bleeding esophageal varices, s/p banding x 4.    *  Ascites.  2.1 liter tap with cell count not c/w SBP. D/w radiology PA who described it was challenging to tap that 2.1 liters as pt does not have that much ascites.   No growth from fluid at 6 days, gram stain with no WBCs and no organisms.  On IV Lasix and po Aldactone, .    *  Coagulopathy. Persists.  Did not respond to IV vitamin K.  Pre EGD, received FFP x 3.      *  Blood loss anemia and suspect anemia of chronic dz.  Stable.  s/p PRBC x 5.     *  persistent sinus tachycardia.    *  Code status: Goals of care, Pall Care meeting with pt and family 5/1: remains full code.    PLAN   *  Called neuro for consult.  Will be seen today.    *  Anyway to mobilize pt?  At least get him to lounge  chair?  *  Any plans to treat the persistent tachycardia??  *  Note 5/1 orders from hospitalist for paracentesis but no fluid studies ordered. I cancelled the tap order.   *  CBC and KUB now.  If WBCs still high, diagnostic tap with cell count diff and culture.    *  Try Reglan 20 mg IV q 6 for 3 days, though did not make much difference over the w/e.     Addendum 1414:  persistent SB ileus.  WBCs now 20K Plan: ordered paracentesis with cell count/diff and fluid clx.   Start back daily Rocephin.     Jennye Moccasin  10/05/2016, 10:28 AM Pager: [phone]

## 2016-10-06 ENCOUNTER — Encounter (HOSPITAL_COMMUNITY): Payer: Self-pay | Admitting: Physician Assistant

## 2016-10-06 ENCOUNTER — Inpatient Hospital Stay (HOSPITAL_COMMUNITY): Payer: PRIVATE HEALTH INSURANCE

## 2016-10-06 DIAGNOSIS — Z515 Encounter for palliative care: Secondary | ICD-10-CM

## 2016-10-06 DIAGNOSIS — Z7189 Other specified counseling: Secondary | ICD-10-CM

## 2016-10-06 DIAGNOSIS — K7031 Alcoholic cirrhosis of liver with ascites: Secondary | ICD-10-CM

## 2016-10-06 LAB — COMPREHENSIVE METABOLIC PANEL
ALK PHOS: 76 U/L (ref 38–126)
ALT: 51 U/L (ref 17–63)
AST: 73 U/L — AB (ref 15–41)
Albumin: 2.2 g/dL — ABNORMAL LOW (ref 3.5–5.0)
Anion gap: 11 (ref 5–15)
BILIRUBIN TOTAL: 8 mg/dL — AB (ref 0.3–1.2)
BUN: 12 mg/dL (ref 6–20)
CALCIUM: 7.6 mg/dL — AB (ref 8.9–10.3)
CO2: 21 mmol/L — ABNORMAL LOW (ref 22–32)
CREATININE: 0.67 mg/dL (ref 0.61–1.24)
Chloride: 97 mmol/L — ABNORMAL LOW (ref 101–111)
GFR calc Af Amer: >60 mL/min (ref >60)
GLUCOSE: 102 mg/dL — AB (ref 65–99)
Potassium: 3.2 mmol/L — ABNORMAL LOW (ref 3.5–5.1)
Sodium: 129 mmol/L — ABNORMAL LOW (ref 135–145)
TOTAL PROTEIN: 6.3 g/dL — AB (ref 6.5–8.1)

## 2016-10-06 LAB — URINALYSIS, ROUTINE W REFLEX MICROSCOPIC
BILIRUBIN URINE: NEGATIVE
Bacteria, UA: NONE SEEN
Glucose, UA: NEGATIVE mg/dL
Ketones, ur: NEGATIVE mg/dL
LEUKOCYTES UA: NEGATIVE
Nitrite: NEGATIVE
PH: 5 (ref 5.0–8.0)
Protein, ur: 30 mg/dL — AB
SPECIFIC GRAVITY, URINE: 1.021 (ref 1.005–1.030)
SQUAMOUS EPITHELIAL / LPF: NONE SEEN

## 2016-10-06 LAB — CBC
HCT: 24.8 % — ABNORMAL LOW (ref 39.0–52.0)
Hemoglobin: 8.3 g/dL — ABNORMAL LOW (ref 13.0–17.0)
MCH: 31.3 pg (ref 26.0–34.0)
MCHC: 33.5 g/dL (ref 30.0–36.0)
MCV: 93.6 fL (ref 78.0–100.0)
PLATELETS: 178 10*3/uL (ref 150–400)
RBC: 2.65 MIL/uL — ABNORMAL LOW (ref 4.22–5.81)
RDW: 20.6 % — AB (ref 11.5–15.5)
WBC: 17.7 10*3/uL — AB (ref 4.0–10.5)

## 2016-10-06 MED ORDER — POTASSIUM CHLORIDE CRYS ER 20 MEQ PO TBCR
40.0000 meq | EXTENDED_RELEASE_TABLET | Freq: Once | ORAL | Status: AC
  Administered 2016-10-06: 40 meq via ORAL
  Filled 2016-10-06: qty 2

## 2016-10-06 MED ORDER — FUROSEMIDE 40 MG PO TABS
40.0000 mg | ORAL_TABLET | Freq: Every day | ORAL | Status: DC
  Administered 2016-10-07 – 2016-10-09 (×3): 40 mg via ORAL
  Filled 2016-10-06 (×4): qty 1

## 2016-10-06 MED ORDER — LACTULOSE 10 GM/15ML PO SOLN
30.0000 g | Freq: Three times a day (TID) | ORAL | Status: DC
  Administered 2016-10-06 (×3): 30 g via ORAL
  Filled 2016-10-06 (×3): qty 45

## 2016-10-06 MED ORDER — LIDOCAINE HCL 1 % IJ SOLN
INTRAMUSCULAR | Status: AC
  Filled 2016-10-06: qty 20

## 2016-10-06 NOTE — Progress Notes (Addendum)
PROGRESS NOTE    Ray Hayden  WUJ:[id number] DOB: 02/26/1966 DOA: 09/21/2016 PCP: Marcelo Baldy, MD    Brief Narrative: 51 year old alcoholic male with cirrhosis who presents with UGI bleed due to varices that was banded.  Patient was intubated for alcohol withdrawal and EGD. He was extubated on 4/24 and precedex discontinued on 4/25. He was transferred to [facility] on 4/26. Since transfer to [facility], patient has been in out of confusion and encephalopathic. He has completed total of 9 days of IV antibiotics for possible aspiration pneumonia. In view of multiple admissions, non compliant to medications, alcohol abuse, palliative care consulted for goals of care. GI has been following the patient for upper GI bleed, ascites and encephalopathy.   Assessment & Plan:   Active Problems:   GI bleed   LFTs abnormal   Encephalopathy, hepatic (HCC)   Liver disease, chronic, with cirrhosis (HCC)   Respiratory failure (HCC)   Abdominal distension   Goals of care, counseling/discussion   Palliative care encounter   Variceal Upper GI bleed in cirrhotic with previous hx GI bleeds,  EGDs and banding of esophageal varices dating 12/2015 - 07/2016 at [facility].  EGD 4/18: grade 3 esophageal varices with active bleeding. Banded x 4.  Completed octreotide and is on PPI BID.  No improvement of coagulopathy with vitamin k.  As per RN report, noted couple of streaks of blood after his fourth BM. GI aware. Monitor without acute intervention.  Anemia - Stable.  Alcoholic liver disease with cirrhosis and ascites, coagulopathy;  US paracentesis by IR on 4/26, only 2.1 liters removed, significant abd distention.  GI ordered abd film, for evaluation of ileus, it showed gaseous distention with ileus, later progressed to partial sbo, which improved spontaneously. Repeat abd film with chest shows moderate dilated air-filled small bowel loops with air throughout the colon.  Resume rifaximin and lactulose,lasix  and aldactone. Remains confused .  GI follow-up appreciated. Discussed with Dr.Pyrtle 5/2: High volume Paracentesis initially canceled on 5/1 due to concern for significantly distended small bowel related to ileus and the risk of perforation. Empirically starting on IV Zosyn on 5/2 for SBP & cover for PNA. He advised no rectal tube for now. GI have now ordered diagnostic paracentesis due to worsening leukocytosis. Minimal ascites on ultrasound and hence IR unable to safely perform diagnostic paracentesis.  Acute hepatic encephalopathy:  Continue rifaximin main and lactulose. Patient refusing lactulose enemas. Treating for possible SBP.  CT head >not a great study due to motion degradation but no acute abnormalities noted. MRI brain without acute findings or findings suggestive of her neck is encephalopathy. 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 Improving  Partial SBO/ileus:  Discussion as above. Now having multiple BMs and passing flatus. Hopefully resolving.  ETOH dependence, with ETOH hepatitis and shock: improving.  Social worker consulted. PLAN FOR SNF on discharge.   Acute respiratory failure possibly from aspiration pneumonia.  Completed course of antibiotics for this indication. Elevated lactate likely related to his advanced liver disease.  Hypokalemia:  Potassium supplements written for 5/2 were not given. Replace today and follow BMP in a.m.  Leukocytosis:  ? Aspiration, get procalcitonin levels > elevated 0.31>0.27.  Afebrile. Monitor.  Chest x-ray 10/05/16 showed improved left basilar subsegmental atelectasis or infiltrate. Right infrahilar atelectasis or infiltrate. Urine microscopy results appreciated, although turbid, no bacteria seen and only 0-5 WBCs. RBCs probably traumatic from Foley and due to coagulopathy. Non on Zosyn. Better. Follow.  Adult  failure to thrive inview of multiple co morbidities, multiple admissions,  worsening liver issues, Dr. Blake DivineAkula discussed with the brothers and called palliative care consult for goals of care .  Palliative care input appreciated. Discussed with them. They had recommended repeat paracentesis which is as discussed above. Morphine was changed to fentanyl when necessary to try and minimize opioids in system. For now, full scope of treatment. Overall prognosis poor.   DVT prophylaxis: SCD'S Code Status: full code.  Family Communication: None at bedside Disposition Plan: SNF on discharge,    Consultants:   Gastroenterology  PCCM.   Neurology   Procedures:  EGD on 4/18  Paracentesis  Foley catheter-? Time to remove.   Antimicrobials:   zosyn completed 5 days, rocephin and zithromax. 4 days.   Now on IV Zosyn.   Subjective: Seen this morning. Alert and oriented 2. More coherent. States that he has had lots of flatus. As per chart review, 4 BMs since morning and the last one had couple of streaks of blood per RN. Denies abdominal pain and indicates that the abdominal tightness is improving.  Objective: Vitals:   10/06/16 0800 10/06/16 0830 10/06/16 1100 10/06/16 1400  BP: (!) 98/50  116/71 114/62  Pulse: (!) 103 (!) 101  (!) 112  Resp:  (!) 36    Temp:    99 F (37.2 C)  TempSrc:    Oral  SpO2:      Weight:      Height:        Intake/Output Summary (Last 24 hours) at 10/06/16 1707 Last data filed at 10/06/16 1400  Gross per 24 hour  Intake             1368 ml  Output              725 ml  Net              643 ml   Filed Weights   10/04/16 0531 10/05/16 0500 10/06/16 0508  Weight: 102.4 kg (225 lb 12 oz) 102.8 kg (226 lb 10.1 oz) 103 kg (227 lb 1.2 oz)    Examination:  General exam: Pleasant middle-aged male, moderately built and poorly nourished, chronically ill looking, lying comfortably propped up in bed. Scleral icterus. Respiratory system: Diminished breath sounds in the bases with occasional basal crackles but otherwise clear to  auscultation. No increased work of breathing. Cardiovascular system: S1 & S2 heard, RRR.  2 +  pedal edema. Telemetry: Sinus tachycardia in the 100'-110s. Gastrointestinal system: Abdomen still significantly distended but less compared to yesterday, tympanitic, nontender. No fluid thrill appreciated. Improved bowel sounds heard. Central nervous system: Alert and oriented 2. No focal neurological deficits. Skin: No rashes, lesions or ulcers Psychiatry: Pleasant and answers questions appropriately.    Data Reviewed: I have personally reviewed following labs and imaging studies  CBC:  Recent Labs Lab 10/04/16 0403 10/05/16 1228 10/06/16 0340  WBC 19.6* 20.5* 17.7*  HGB 9.0* 9.0* 8.3*  HCT 26.6* 26.5* 24.8*  MCV 95.3 94.6 93.6  PLT 164 178 178   Basic Metabolic Panel:  Recent Labs Lab 09/29/16 1833 10/01/16 1410 10/05/16 1228 10/06/16 0340  NA 139 138 130* 129*  K 3.5 4.1 3.4* 3.2*  CL 108 106 99* 97*  CO2 23 18* 19* 21*  GLUCOSE 101* 113* 122* 102*  BUN 11 11 14 12   CREATININE 0.76 0.84 0.84 0.67  CALCIUM 8.0* 8.0* 7.8* 7.6*   GFR: Estimated Creatinine Clearance: 135.7 mL/min (by C-G  formula based on SCr of 0.67 mg/dL). Liver Function Tests:  Recent Labs Lab 10/03/16 0447 10/05/16 1228 10/06/16 0340  AST 85* 88* 73*  ALT 72* 61 51  ALKPHOS 80 86 76  BILITOT 8.0* 9.4* 8.0*  PROT 6.5 6.7 6.3*  ALBUMIN 2.5* 2.4* 2.2*    Recent Labs Lab 09/30/16 0711 10/04/16 0403  AMMONIA 54* 56*   Coagulation Profile:  Recent Labs Lab 09/30/16 0711 10/04/16 0403  INR 2.18 2.35   CBG:  Recent Labs Lab 10/03/16 0047 10/03/16 0433 10/03/16 0802 10/03/16 1131 10/03/16 1622  GLUCAP 108* 99 104* 123* 103*   Sepsis Labs:  Recent Labs Lab 10/04/16 0701 10/04/16 1247 10/05/16 1228  PROCALCITON  --  0.31 0.27  LATICACIDVEN 2.4*  --   --     Recent Results (from the past 240 hour(s))  Culture, body fluid-bottle     Status: None   Collection Time:  09/29/16 12:14 PM  Result Value Ref Range Status   Specimen Description PERITONEAL  Final   Special Requests NONE  Final   Culture NO GROWTH 5 DAYS  Final   Report Status 10/04/2016 FINAL  Final  Gram stain     Status: None   Collection Time: 09/29/16 12:14 PM  Result Value Ref Range Status   Specimen Description PERITONEAL  Final   Special Requests NONE  Final   Gram Stain NO WBC SEEN NO ORGANISMS SEEN   Final   Report Status 09/29/2016 FINAL  Final         Radiology Studies: Dg Chest 2 View  Result Date: 10/05/2016 CLINICAL DATA:  Leukocytosis. EXAM: CHEST  2 VIEW COMPARISON:  Radiographs of Oct 04, 2016. FINDINGS: Stable cardiomediastinal silhouette. No pneumothorax is noted. Stable elevated right hemidiaphragm is noted. Right infrahilar atelectasis or infiltrate is noted. Mildly improved left basilar atelectasis or infiltrate is noted. Bony thorax is unremarkable. No significant pleural effusion is noted. IMPRESSION: Improved left basilar subsegmental atelectasis or infiltrate. Right infrahilar atelectasis or infiltrate. Electronically Signed   By: Lupita Raider, M.D.   On: 10/05/2016 15:30   Dg Abd 1 View  Result Date: 10/05/2016 CLINICAL DATA:  Persistent abdominal pain and distention for approximately 3 weeks EXAM: ABDOMEN - 1 VIEW COMPARISON:  Oct 04, 2016 FINDINGS: Mild generalized bowel dilatation remains. Air is noted in the rectum. There are no demonstrable air-fluid levels. No free air. No abnormal calcifications. Apparent rectal thermometer in place. IMPRESSION: Persistent bowel dilatation. Suspect ileus. Bowel obstruction felt to be less likely. No free air evident. Electronically Signed   By: Bretta Bang III M.D.   On: 10/05/2016 11:49   Mr Brain Wo Contrast  Result Date: 10/05/2016 CLINICAL DATA:  51 y/o M; encephalopathy with history of upper GI bleed, cirrhosis, metabolic derangement, alcohol abuse, and probable withdrawal. Concern for Wernicke encephalopathy.  EXAM: MRI HEAD WITHOUT CONTRAST TECHNIQUE: Multiplanar, multiecho pulse sequences of the brain and surrounding structures were obtained without intravenous contrast. COMPARISON:  10/04/2016 CT head. FINDINGS: Brain: No acute infarction, hemorrhage, hydrocephalus, extra-axial collection or mass lesion. No specific findings of Wernicke encephalopathy. Mild diffuse supratentorial and infratentorial parenchymal volume loss. Vascular: Normal flow voids. Skull and upper cervical spine: Normal marrow signal. Sinuses/Orbits: Small mucous retention cyst in the right maxillary sinus postsurgical changes related to partial ethmoidectomy and maxillary antrostomy. Small left mastoid air cell effusion. Orbits are unremarkable. The Other: None. IMPRESSION: 1. No acute intracranial abnormality. No specific findings of Wernicke encephalopathy. 2. Mild diffuse supratentorial and infratentorial  parenchymal volume loss. 3. Small left mastoid air cell effusion. Electronically Signed   By: Mitzi Hansen M.D.   On: 10/05/2016 20:06        Scheduled Meds: . feeding supplement  1 Container Oral TID BM  . feeding supplement (ENSURE ENLIVE)  237 mL Oral BID BM  . folic acid  1 mg Oral Daily  . furosemide  40 mg Oral Daily  . lactulose  30 g Oral TID  . mouth rinse  15 mL Mouth Rinse BID  . multivitamin with minerals  1 tablet Oral Daily  . nicotine  21 mg Transdermal Daily  . pantoprazole  40 mg Oral Daily  . rifaximin  550 mg Oral BID  . sodium chloride flush  3 mL Intravenous Q12H  . spironolactone  150 mg Oral Daily  . thiamine injection  250 mg Intravenous Daily   Continuous Infusions: . metoCLOPramide (REGLAN) injection Stopped (10/06/16 1411)  . piperacillin-tazobactam (ZOSYN)  IV 3.375 g (10/06/16 1357)     LOS: 15 days    Time spent: 35 minutes.    Marcellus Scott, MD, FACP, FHM. [facility] Pager [phone]  If 7PM-7AM, please contact night-coverage [email or web address] Password  [email or web address] 10/06/2016, 5:07 PM

## 2016-10-06 NOTE — Progress Notes (Signed)
Paracentesis not performed.  Minimal ascites noted in RLQ.  Not enough fluid to safely perform paracentesis.  Ray Hayden E 11:53 AM 10/06/2016

## 2016-10-06 NOTE — Progress Notes (Signed)
Physical Therapy Treatment Patient Details Name: Ray Hayden MRN: [id number] DOB: 05/02/1966 Today's Date: 10/06/2016    History of Present Illness 51 year old alcoholic male adm 09/21/2016 with UGI bleed due to varices that was banded. Patient vomiting blood & intubated for airway protection in setting of ETOH withdrawal; He was extubated on 4/24; PMHx: cirrhosis, ETOH, smoker, HTN    PT Comments    Pt progressing towards goals. Requiring min A +2 for safety during gait with RW and basic mobility tasks. Pt limited this session by fatigue, however, sats maintained at 91% on RA during ambulation. Current d/c recommendations appropriate. Will continue to follow to maximize functional mobility independence.    Follow Up Recommendations  SNF     Equipment Recommendations  None recommended by PT    Recommendations for Other Services       Precautions / Restrictions Precautions Precautions: Fall Restrictions Weight Bearing Restrictions: No    Mobility  Bed Mobility Overal bed mobility: Needs Assistance Bed Mobility: Sit to Supine       Sit to supine: Mod assist   General bed mobility comments: Mod A for BLE lifting into bed. Pt able to scoot in bed with verbal cues.   Transfers Overall transfer level: Needs assistance Equipment used: Rolling walker (2 wheeled) Transfers: Sit to/from Stand Sit to Stand: Min assist;+2 safety/equipment         General transfer comment: Min A for lift assist from lower surface. Pt demonstrating safe hand placement.   Ambulation/Gait Ambulation/Gait assistance: Min assist;Min guard;+2 safety/equipment Ambulation Distance (Feet): 50 Feet Assistive device: Rolling walker (2 wheeled) Gait Pattern/deviations: Step-through pattern;Narrow base of support Gait velocity: Decreased Gait velocity interpretation: Below normal speed for age/gender General Gait Details: Slow, unsteady gait. Limited by fatigue and SOB, however, oxygen sats at 91%  throughout. Assist with RW sequencing when turning. Occaisional standing rest break required. Verbal cues for upright posture.    Stairs            Wheelchair Mobility    Modified Rankin (Stroke Patients Only)       Balance Overall balance assessment: Needs assistance Sitting-balance support: No upper extremity supported;Feet supported Sitting balance-Leahy Scale: Good     Standing balance support: Bilateral upper extremity supported Standing balance-Leahy Scale: Poor Standing balance comment: Reliant on RW for stability                             Cognition Arousal/Alertness: Awake/alert Behavior During Therapy: WFL for tasks assessed/performed Overall Cognitive Status: Within Functional Limits for tasks assessed                                        Exercises      General Comments General comments (skin integrity, edema, etc.): Pt having BM during session. Required assist with clean up. Notified student RN about BM.       Pertinent Vitals/Pain Pain Assessment: Faces Faces Pain Scale: Hurts a little bit Pain Location: stomach Pain Descriptors / Indicators: Discomfort Pain Intervention(s): Limited activity within patient's tolerance;Monitored during session;Repositioned    Home Living                      Prior Function            PT Goals (current goals can now be found in the care plan  section) Acute Rehab PT Goals Patient Stated Goal: per pt family--rehab at SNF PT Goal Formulation: With family Time For Goal Achievement: 10/14/16 Potential to Achieve Goals: Fair Progress towards PT goals: Progressing toward goals    Frequency    Min 3X/week      PT Plan Current plan remains appropriate    Co-evaluation              AM-PAC PT "6 Clicks" Daily Activity  Outcome Measure  Difficulty turning over in bed (including adjusting bedclothes, sheets and blankets)?: Total Difficulty moving from lying on  back to sitting on the side of the bed? : Total Difficulty sitting down on and standing up from a chair with arms (e.g., wheelchair, bedside commode, etc,.)?: Total Help needed moving to and from a bed to chair (including a wheelchair)?: A Little Help needed walking in hospital room?: A Little Help needed climbing 3-5 steps with a railing? : A Lot 6 Click Score: 11    End of Session Equipment Utilized During Treatment: Gait belt Activity Tolerance: Patient limited by fatigue Patient left: in bed;with call bell/phone within reach;with bed alarm set;with family/visitor present Nurse Communication: Mobility status PT Visit Diagnosis: Muscle weakness (generalized) (M62.81);Unsteadiness on feet (R26.81);Other abnormalities of gait and mobility (R26.89)     Time: 1428-1500 PT Time Calculation (min) (ACUTE ONLY): 32 min  Charges:  $Gait Training: 23-37 mins                    G Codes:       Ray Hayden, PT, DPT  Acute Rehabilitation Services  Pager: [phone]    Ray Hayden 10/06/2016, 3:50 PM
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
## 2016-10-06 NOTE — Progress Notes (Signed)
Daily Rounding Note  10/06/2016, 8:37 AM  LOS: 15 days   SUBJECTIVE:   Chief complaint: abdomen feels tight, not particularly painful.  Refusing lactulose enemas, when asked why went into drawn out story about a dream where his father (or was it his grandfather, he was not clear on this) had a stroke and Robbert then spoke with his girlfriend and a Education officer, environmentalpastor and accepted Jesus into his life.    When asked what this had to do with not taking the enemas, he answered "I just don't think it's right".  Says he had 1 BM yesterday.     No nausea but not really liking the soft diet.  Per PT: using rolling walker, walked 26 then 48 feet with interim rest yesterday.    OBJECTIVE:         Vital signs in last 24 hours:    Temp:  [98.5 F (36.9 C)-99.7 F (37.6 C)] 99 F (37.2 C) (05/03 0700) Pulse Rate:  [99-116] 105 (05/03 0700) Resp:  [18] 18 (05/03 0506) BP: (91-117)/(44-69) 98/50 (05/03 0800) SpO2:  [93 %-94 %] 93 % (05/03 0700) Weight:  [103 kg (227 lb 1.2 oz)] 103 kg (227 lb 1.2 oz) (05/03 0508) Last BM Date: 10/05/16 Filed Weights   10/04/16 0531 10/05/16 0500 10/06/16 0508  Weight: 102.4 kg (225 lb 12 oz) 102.8 kg (226 lb 10.1 oz) 103 kg (227 lb 1.2 oz)   General: alert, comfortable.  Debilitated and chronically ill looking   Heart: Tachy.   Chest: clear (was able to pull himself up to sitting position by using both bed rails!) Abdomen: less tense, still protuberant and distended.  NT.  BS tinkling.  Extremities: 2 to 3 + LE edema. Neuro/Psych:  Pleasant, cooperative, emotionally labile.  Follows commands.  Oriented x 3 but tangential speech.  No asterixis.    Intake/Output from previous day: 05/02 0701 - 05/03 0700 In: 1451 [P.O.:1397; IV Piggyback:54] Out: 525 [Urine:525]  Intake/Output this shift: No intake/output data recorded.  Lab Results:  Recent Labs  10/04/16 0403 10/05/16 1228 10/06/16 0340  WBC 19.6* 20.5*  17.7*  HGB 9.0* 9.0* 8.3*  HCT 26.6* 26.5* 24.8*  PLT 164 178 178   BMET  Recent Labs  10/05/16 1228 10/06/16 0340  NA 130* 129*  K 3.4* 3.2*  CL 99* 97*  CO2 19* 21*  GLUCOSE 122* 102*  BUN 14 12  CREATININE 0.84 0.67  CALCIUM 7.8* 7.6*   LFT  Recent Labs  10/05/16 1228 10/06/16 0340  PROT 6.7 6.3*  ALBUMIN 2.4* 2.2*  AST 88* 73*  ALT 61 51  ALKPHOS 86 76  BILITOT 9.4* 8.0*   PT/INR  Recent Labs  10/04/16 0403  LABPROT 26.2*  INR 2.35   Hepatitis Panel No results for input(s): HEPBSAG, HCVAB, HEPAIGM, HEPBIGM in the last 72 hours.  Studies/Results: Dg Chest 2 View  Result Date: 10/05/2016 CLINICAL DATA:  Leukocytosis. EXAM: CHEST  2 VIEW COMPARISON:  Radiographs of Oct 04, 2016. FINDINGS: Stable cardiomediastinal silhouette. No pneumothorax is noted. Stable elevated right hemidiaphragm is noted. Right infrahilar atelectasis or infiltrate is noted. Mildly improved left basilar atelectasis or infiltrate is noted. Bony thorax is unremarkable. No significant pleural effusion is noted. IMPRESSION: Improved left basilar subsegmental atelectasis or infiltrate. Right infrahilar atelectasis or infiltrate. Electronically Signed   By: Lupita RaiderJames  Green Jr, M.D.   On: 10/05/2016 15:30   Dg Abd 1 View  Result Date: 10/05/2016 CLINICAL  DATA:  Persistent abdominal pain and distention for approximately 3 weeks EXAM: ABDOMEN - 1 VIEW COMPARISON:  Oct 04, 2016 FINDINGS: Mild generalized bowel dilatation remains. Air is noted in the rectum. There are no demonstrable air-fluid levels. No free air. No abnormal calcifications. Apparent rectal thermometer in place. IMPRESSION: Persistent bowel dilatation. Suspect ileus. Bowel obstruction felt to be less likely. No free air evident. Electronically Signed   By: Bretta Bang III M.D.   On: 10/05/2016 11:49   Ct Head W & Wo Contrast  Result Date: 10/04/2016 CLINICAL DATA:  Encephalopathy.  Cirrhosis. EXAM: CT HEAD WITHOUT AND WITH CONTRAST  TECHNIQUE: Contiguous axial images were obtained from the base of the skull through the vertex without and with intravenous contrast CONTRAST:  75mL ISOVUE-300 IOPAMIDOL (ISOVUE-300) INJECTION 61% COMPARISON:  None. FINDINGS: Brain: Image quality degraded by extensive motion. Multiple repeat images due to motion. Mild atrophy. Negative for hydrocephalus. Negative for acute infarct. Negative for hemorrhage mass or edema. Normal enhancement postcontrast administration Vascular: Negative Skull: Negative Sinuses/Orbits: Negative Other: None IMPRESSION: Image quality degraded by extensive motion No acute abnormality. Electronically Signed   By: Marlan Palau M.D.   On: 10/04/2016 11:38   Mr Brain Wo Contrast  Result Date: 10/05/2016 CLINICAL DATA:  51 y/o M; encephalopathy with history of upper GI bleed, cirrhosis, metabolic derangement, alcohol abuse, and probable withdrawal. Concern for Wernicke encephalopathy. EXAM: MRI HEAD WITHOUT CONTRAST TECHNIQUE: Multiplanar, multiecho pulse sequences of the brain and surrounding structures were obtained without intravenous contrast. COMPARISON:  10/04/2016 CT head. FINDINGS: Brain: No acute infarction, hemorrhage, hydrocephalus, extra-axial collection or mass lesion. No specific findings of Wernicke encephalopathy. Mild diffuse supratentorial and infratentorial parenchymal volume loss. Vascular: Normal flow voids. Skull and upper cervical spine: Normal marrow signal. Sinuses/Orbits: Small mucous retention cyst in the right maxillary sinus postsurgical changes related to partial ethmoidectomy and maxillary antrostomy. Small left mastoid air cell effusion. Orbits are unremarkable. The Other: None. IMPRESSION: 1. No acute intracranial abnormality. No specific findings of Wernicke encephalopathy. 2. Mild diffuse supratentorial and infratentorial parenchymal volume loss. 3. Small left mastoid air cell effusion. Electronically Signed   By: Mitzi Hansen M.D.   On:  10/05/2016 20:06   Dg Abd Acute W/chest  Result Date: 10/04/2016 CLINICAL DATA:  Small bowel obstruction. Fever, aspiration pneumonia. EXAM: DG ABDOMEN ACUTE W/ 1V CHEST COMPARISON:  10/03/2016 and 10/02/2016, 09/30/2016 FINDINGS: Lungs are hypoinflated with minimal left base opacification likely atelectasis, although cannot exclude infection. Cardiomediastinal silhouette and remainder of the chest is unchanged. Abdominal films demonstrate several air-filled dilated small bowel loops measuring up to 6.2 cm in diameter without significant change. There is moderate air throughout the colon. Small caliber catheter over the rectum. Remainder of the exam is unchanged. IMPRESSION: No significant change in moderate dilated air-filled small bowel loops with air throughout the colon. Hypoinflated lungs with mild left base opacification likely atelectasis, although cannot exclude infection. Electronically Signed   By: Elberta Fortis M.D.   On: 10/04/2016 14:39   Scheduled Meds: . feeding supplement  1 Container Oral TID BM  . feeding supplement (ENSURE ENLIVE)  237 mL Oral BID BM  . folic acid  1 mg Oral Daily  . furosemide  40 mg Intravenous BID  . lactulose  20 g Oral TID  . mouth rinse  15 mL Mouth Rinse BID  . multivitamin with minerals  1 tablet Oral Daily  . nicotine  21 mg Transdermal Daily  . pantoprazole  40 mg Oral Daily  .  potassium chloride  40 mEq Oral Once  . rifaximin  550 mg Oral BID  . sodium chloride flush  3 mL Intravenous Q12H  . spironolactone  150 mg Oral Daily  . thiamine injection  250 mg Intravenous Daily   Continuous Infusions: . metoCLOPramide (REGLAN) injection Stopped (10/06/16 0546)  . piperacillin-tazobactam (ZOSYN)  IV 3.375 g (10/06/16 0531)   PRN Meds:.acetaminophen, fentaNYL (SUBLIMAZE) injection, sodium chloride flush   ASSESMENT:   * Encephalopathy, multifactorial. HE with mild elevation Ammonia, covered with Lactulose PO and PR as well as Rifaximin.  Not  able to retain full volume of enemas however.     Head CT and MRI unremarkable/unrevealing.  Day 4 daily 250 mg IV Thiamine, but had received 100 mg IV daily 4/18 - 4/24.    * Ileus. SB and colon.  Ileus not ascites is source to abd distention.  With lactulose enemas is getting distal colonic stimulation.  Added 3 days (12 doses) of Reglan IV q 6 hours on 5/2.    * Leukocytosis, improved but persists.  Zosyn day 2. ? Source (U/A not yet collected), ? Right sided infiltrate per 5/2 CXR. Note elevated Lactate. Latest U/A 4/18 (negative). Latest CXR 4/27: atx vs consolidation.   * Variceal bleed. 4/18 EGD: grade 3, bleeding esophageal varices, s/p banding x 4.   * Ascites. 2.1 liter tap 4/26 with cell count not c/w SBP. D/w radiology PA who described it was challenging to tap that 2.1 liters as pt does not have that much ascites. No growth from fluid, gram stain with no WBCs and no organisms.  On IV Lasix and po Aldactone, . Repeat paracentesis with labs ordered, hopefully will be done today.     *  Hyponatremia.  Hypokalemia.  Renal function not compromised.    *  Hypotension.  * Coagulopathy. Persists.  Did not respond to IV vitamin K.  Pre EGD, received FFP x 3.     * Blood loss anemia and suspect anemia of chronic dz. Stable.  s/p PRBC x 5.     * persistent sinus tachycardia.     *  Debilitation, deconditioning.     PLAN   *  Change Lasix to 40 mg po daily, as per home dose.  BMET, CBC in AM.  Dr Waymon Amato to address hypokalemia.   *  Paracentesis, though cell count and diff will now be altered by abx.    *  Reconsider use of Fentanyl for abd pain, it is not helping his mental status. Stopping the lactulose PR, upping po dose from 20 gm to 30 gm TID.    *  PT, OT to build strength.   Addendum at 3 PM.  RN called to tell me that pt just passed small streaks of blood with his otherwise brown stool.  This was, by her recall, his 4th BM today.   No changes  made to current treatment plan.    Pt just underwent 07/19/16 colonoscopy in [place], the report is not in Epic but pathology of tubular adenoma noted.      Jennye Moccasin  10/06/2016, 8:37 AM Pager: [phone]

## 2016-10-06 NOTE — Progress Notes (Signed)
Pt stools contains streaks of blood. MD Hongalgi and Dianah FieldSarah J. Gribbin of GI have been notified.

## 2016-10-06 NOTE — Progress Notes (Signed)
Daily Progress Note   Patient Name: Ray Hayden       Date: 10/06/2016 DOB: 1965-09-04  Age: 51 y.o. MRN#: [id number] Attending Physician: Modena Jansky, MD Primary Care Physician: Dorris Fetch, MD Admit Date: 09/21/2016  Reason for Consultation/Follow-up: Palliative care consulted to assist with [place] and decision making with family d/t worsening liver cirrhosis, poor improvement, and poor prognosis.    Subjective: Gyasi is up in chair today. More alert and oriented - has made significant cognitive improvements over past 2 days. Brother at bedside.   Length of Stay: 15  Current Medications: Scheduled Meds:  . lidocaine      . feeding supplement  1 Container Oral TID BM  . feeding supplement (ENSURE ENLIVE)  237 mL Oral BID BM  . folic acid  1 mg Oral Daily  . furosemide  40 mg Oral Daily  . lactulose  30 g Oral TID  . mouth rinse  15 mL Mouth Rinse BID  . multivitamin with minerals  1 tablet Oral Daily  . nicotine  21 mg Transdermal Daily  . pantoprazole  40 mg Oral Daily  . rifaximin  550 mg Oral BID  . sodium chloride flush  3 mL Intravenous Q12H  . spironolactone  150 mg Oral Daily  . thiamine injection  250 mg Intravenous Daily    Continuous Infusions: . metoCLOPramide (REGLAN) injection Stopped (10/06/16 0546)  . piperacillin-tazobactam (ZOSYN)  IV Stopped (10/06/16 1057)    PRN Meds: acetaminophen, fentaNYL (SUBLIMAZE) injection, sodium chloride flush  Physical Exam  Constitutional: He is oriented to person, place, and time. He appears well-developed.  Cardiovascular: Tachycardia present.   Pulmonary/Chest: No accessory muscle usage. No tachypnea. No respiratory distress.  Mild distress and tachypnea is improved sitting up in chair  Abdominal: He exhibits distension  and ascites.  May be slightly smaller but not significantly, maintains severe distention  Neurological: He is alert and oriented to person, place, and time.  Still moments of confusion but becoming much more clear with improved memory from day to day. He struggles with the timeline of his current admission which is expected given his encephalopathy and time on vent in ICU.   Nursing note and vitals reviewed.           Vital Signs: BP 116/71 (BP Location: Left Arm)  Pulse (!) 101   Temp 99 F (37.2 C) (Oral)   Resp (!) 36   Ht 6' (1.829 m)   Wt 103 kg (227 lb 1.2 oz)   SpO2 93%   BMI 30.80 kg/m  SpO2: SpO2: 93 % O2 Device: O2 Device: Not Delivered O2 Flow Rate: O2 Flow Rate (L/min): 2 L/min  Intake/output summary:   Intake/Output Summary (Last 24 hours) at 10/06/16 1135 Last data filed at 10/06/16 0930  Gross per 24 hour  Intake             1629 ml  Output              525 ml  Net             1104 ml   LBM: Last BM Date: 10/05/16 Baseline Weight: Weight: 83.9 kg (185 lb) Most recent weight: Weight: 103 kg (227 lb 1.2 oz)       Palliative Assessment/Data:    Flowsheet Rows     Most Recent Value  Intake Tab  Referral Department  Hospitalist  Unit at Time of Referral  Med/Surg Unit  Palliative Care Primary Diagnosis  Other (Comment) [GI]  Date Notified  10/02/16  Palliative Care Type  New Palliative care  Reason for referral  Clarify Goals of Care  Date of Admission  09/21/16  Date first seen by Palliative Care  10/02/16  # of days Palliative referral response time  0 Day(s)  # of days IP prior to Palliative referral  11  Clinical Assessment  Psychosocial & Spiritual Assessment  Palliative Care Outcomes      Patient Active Problem List   Diagnosis Date Noted  . Goals of care, counseling/discussion   . Palliative care encounter   . Abdominal distension   . Liver disease, chronic, with cirrhosis ([place])   . Respiratory failure ([place])   . Encephalopathy,  hepatic ([place])   . LFTs abnormal   . GI bleed 09/21/2016  . Hematemesis   . Acute blood loss anemia   . Alcoholic cirrhosis of liver with ascites ([place])   . Ascites due to alcoholic cirrhosis ([place])   . Coagulopathy ([place])   . Hypovolemic shock ([place])   . Lactic acidosis   . Bleeding esophageal varices ([facility])     Palliative Care Assessment & Plan   HPI: 51 year old male with alcoholic cirrhosis who presents with UGI bleed due to varices that was banded. Patient was intubated for withdrawal and EGD ON 4/18. He was extubated on 4/24 and precedex discontinued on 4/25. Paracentesis removed 2.1L on 4/26 with no signs of SBP. Continues to have hepatic encephalopathy. Complicated by ileus. Previous h/o esophageal varice banding 12/2015 and 07/2016 with recurrent bleeding. Palliative care consulted to assist with [place] and decision making with family d/t worsening liver cirrhosis, poor improvement, and poor prognosis.   Assessment: I met again with Loras and brother, Richardson Landry, at bedside. They are happy with his improvement over the past few days and we reviewed how severely ill [name] has been and how we were not sure he was going to survive but is finally making some improvement. He understands that he still has severe cirrhosis that is not going to go away but is hopeful for continued improvement and to go back to rehab. Delquan remains motivated and has strong support from family to assist him in his goal to obtain and maintain sobriety. They will continue to work on getting his financial affairs in order and on  HCPOA. For now Tuff wishes for continued aggressive care and full code.   Recommendations/Plan:  Consider rectal tube for decompression  Continue fentanyl prn for pain - he has only needed ~2 doses per day  OOB BID  Goals of Care and Additional Recommendations:  Limitations on Scope of Treatment: Full Scope Treatment  Code Status:  Full code  Prognosis:   Unable to determine but overall  prognosis poor with severe cirrhosis.   Discharge Planning:  To Be Determined. Likely SNF rehab.    Thank you for allowing the Palliative Medicine Team to assist in the care of this patient.   Total Time 16mn Prolonged Time Billed  no       Greater than 50%  of this time was spent counseling and coordinating care related to the above assessment and plan.  AVinie Sill NP Palliative Medicine Team Pager # [phone](M-F 8a-5p) Team Phone # [phone](Nights/Weekends)

## 2016-10-07 ENCOUNTER — Encounter (HOSPITAL_COMMUNITY): Payer: Self-pay | Admitting: Certified Registered Nurse Anesthetist

## 2016-10-07 ENCOUNTER — Encounter (HOSPITAL_COMMUNITY): Payer: Self-pay | Admitting: Physician Assistant

## 2016-10-07 ENCOUNTER — Encounter (HOSPITAL_COMMUNITY)
Admission: EM | Disposition: A | Discharge status: Skilled Nursing Facility | Payer: Self-pay | Source: Home / Self Care | Attending: Pulmonary Disease

## 2016-10-07 DIAGNOSIS — K567 Ileus, unspecified: Secondary | ICD-10-CM

## 2016-10-07 DIAGNOSIS — Z8719 Personal history of other diseases of the digestive system: Secondary | ICD-10-CM

## 2016-10-07 DIAGNOSIS — K625 Hemorrhage of anus and rectum: Secondary | ICD-10-CM

## 2016-10-07 LAB — CBC
HCT: 21.1 % — ABNORMAL LOW (ref 39.0–52.0)
HEMATOCRIT: 24.1 % — AB (ref 39.0–52.0)
Hemoglobin: 8.3 g/dL — ABNORMAL LOW (ref 13.0–17.0)
Hemoglobin: 7.1 g/dL — ABNORMAL LOW (ref 13.0–17.0)
MCH: 32.7 pg (ref 26.0–34.0)
MCH: 32 pg (ref 26.0–34.0)
MCHC: 34.4 g/dL (ref 30.0–36.0)
MCHC: 33.6 g/dL (ref 30.0–36.0)
MCV: 94.9 fL (ref 78.0–100.0)
MCV: 95 fL (ref 78.0–100.0)
PLATELETS: 160 10*3/uL (ref 150–400)
Platelets: 154 10*3/uL (ref 150–400)
RBC: 2.54 MIL/uL — ABNORMAL LOW (ref 4.22–5.81)
RBC: 2.22 MIL/uL — AB (ref 4.22–5.81)
RDW: 20.9 % — AB (ref 11.5–15.5)
RDW: 20.8 % — AB (ref 11.5–15.5)
WBC: 15.1 10*3/uL — ABNORMAL HIGH (ref 4.0–10.5)
WBC: 16.7 10*3/uL — ABNORMAL HIGH (ref 4.0–10.5)

## 2016-10-07 LAB — COMPREHENSIVE METABOLIC PANEL
ALBUMIN: 2 g/dL — AB (ref 3.5–5.0)
ALT: 47 U/L (ref 17–63)
ANION GAP: 11 (ref 5–15)
AST: 77 U/L — ABNORMAL HIGH (ref 15–41)
Alkaline Phosphatase: 76 U/L (ref 38–126)
BILIRUBIN TOTAL: 7.5 mg/dL — AB (ref 0.3–1.2)
BUN: 15 mg/dL (ref 6–20)
CALCIUM: 7.4 mg/dL — AB (ref 8.9–10.3)
CO2: 20 mmol/L — ABNORMAL LOW (ref 22–32)
Chloride: 98 mmol/L — ABNORMAL LOW (ref 101–111)
Creatinine, Ser: 0.72 mg/dL (ref 0.61–1.24)
GFR calc Af Amer: >60 mL/min (ref >60)
GFR calc non Af Amer: >60 mL/min (ref >60)
GLUCOSE: 102 mg/dL — AB (ref 65–99)
POTASSIUM: 4.1 mmol/L (ref 3.5–5.1)
SODIUM: 129 mmol/L — AB (ref 135–145)
TOTAL PROTEIN: 6.6 g/dL (ref 6.5–8.1)

## 2016-10-07 LAB — AMMONIA: AMMONIA: 42 umol/L — AB (ref 9–35)

## 2016-10-07 LAB — PROTIME-INR
INR: 3.62
PROTHROMBIN TIME: 36.9 s — AB (ref 11.4–15.2)

## 2016-10-07 LAB — PREPARE RBC (CROSSMATCH)

## 2016-10-07 LAB — MAGNESIUM: MAGNESIUM: 1.6 mg/dL — AB (ref 1.7–2.4)

## 2016-10-07 LAB — PROCALCITONIN: PROCALCITONIN: 0.37 ng/mL

## 2016-10-07 SURGERY — EGD (ESOPHAGOGASTRODUODENOSCOPY)
Anesthesia: Monitor Anesthesia Care

## 2016-10-07 SURGERY — CANCELLED PROCEDURE
Anesthesia: Monitor Anesthesia Care

## 2016-10-07 MED ORDER — OCTREOTIDE ACETATE 500 MCG/ML IJ SOLN
50.0000 ug/h | INTRAMUSCULAR | Status: AC
  Administered 2016-10-07 – 2016-10-10 (×4): 50 ug/h via INTRAVENOUS
  Filled 2016-10-07 (×14): qty 1

## 2016-10-07 MED ORDER — SPIRONOLACTONE 50 MG PO TABS
100.0000 mg | ORAL_TABLET | Freq: Every day | ORAL | Status: DC
  Administered 2016-10-07 – 2016-10-09 (×3): 100 mg via ORAL
  Filled 2016-10-07: qty 4
  Filled 2016-10-07 (×3): qty 2

## 2016-10-07 MED ORDER — LACTULOSE 10 GM/15ML PO SOLN
20.0000 g | Freq: Three times a day (TID) | ORAL | Status: DC
  Administered 2016-10-07 – 2016-10-13 (×17): 20 g via ORAL
  Filled 2016-10-07 (×18): qty 30

## 2016-10-07 MED ORDER — OCTREOTIDE LOAD VIA INFUSION
50.0000 ug | Freq: Once | INTRAVENOUS | Status: AC
  Administered 2016-10-07: 50 ug via INTRAVENOUS
  Filled 2016-10-07: qty 25

## 2016-10-07 MED ORDER — HYDROCORTISONE ACETATE 25 MG RE SUPP
25.0000 mg | Freq: Two times a day (BID) | RECTAL | Status: DC
  Administered 2016-10-07 – 2016-10-09 (×2): 25 mg via RECTAL
  Filled 2016-10-07 (×13): qty 1

## 2016-10-07 MED ORDER — MAGNESIUM SULFATE 2 GM/50ML IV SOLN
2.0000 g | Freq: Once | INTRAVENOUS | Status: AC
  Administered 2016-10-07: 2 g via INTRAVENOUS
  Filled 2016-10-07: qty 50

## 2016-10-07 MED ORDER — SODIUM CHLORIDE 0.9 % IV SOLN
Freq: Once | INTRAVENOUS | Status: AC
  Administered 2016-10-07: 19:00:00 via INTRAVENOUS

## 2016-10-07 MED ORDER — VITAMIN B-1 100 MG PO TABS
100.0000 mg | ORAL_TABLET | Freq: Every day | ORAL | Status: DC
  Administered 2016-10-07 – 2016-10-13 (×7): 100 mg via ORAL
  Filled 2016-10-07 (×7): qty 1

## 2016-10-07 MED ORDER — SODIUM CHLORIDE 0.9 % IV SOLN: Freq: Once | INTRAVENOUS | Status: DC

## 2016-10-07 NOTE — Progress Notes (Signed)
Daily Rounding Note  10/07/2016, 8:14 AM  LOS: 16 days   SUBJECTIVE:   Chief complaint: abdominal distention.  Scant streaks of blood in otherwise brown stool yesterday afternoon. Also has blood in urine.  Complaining that he had 6 stools yesterday and was fecal incontinent this AM.  Stool is liquid and dark but not melenic.  Tolerating clears.   Minimal ascites, too small to tap.      Doing well with PT, walked 50 feet with walker but gait unstable and dyspneic.    OBJECTIVE:         Vital signs in last 24 hours:    Temp:  [99 F (37.2 C)-100.4 F (38 C)] 99 F (37.2 C) (05/04 0416) Pulse Rate:  [101-112] 110 (05/04 0416) Resp:  [18-36] 18 (05/04 0416) BP: (92-116)/(41-71) 110/64 (05/04 0416) SpO2:  [93 %-95 %] 95 % (05/04 0416) Weight:  [106.8 kg (235 lb 7.2 oz)] 106.8 kg (235 lb 7.2 oz) (05/04 0416) Last BM Date: 10/06/16 Filed Weights   10/05/16 0500 10/06/16 0508 10/07/16 0416  Weight: 102.8 kg (226 lb 10.1 oz) 103 kg (227 lb 1.2 oz) 106.8 kg (235 lb 7.2 oz)   General: looks ill.  Alert, comfortable   Heart: tachy, regular in 120s.   Chest: clear bil.  Dyspneic. Abdomen: still tense and distended not tender.  Active BS.  Stool in commode is liquid and dark brown.   Extremities: + anasarca, edema in limbs Neuro/Psych:  Oriented x 3.  Speech somewhat pressured, less tangential.  Fully alert.  Appropriate.    Intake/Output from previous day: 05/03 0701 - 05/04 0700 In: 1579 [P.O.:738; I.V.:633; IV Piggyback:208] Out: 877 [Urine:875; Stool:2]  Intake/Output this shift: No intake/output data recorded.  Lab Results:  Recent Labs  10/05/16 1228 10/06/16 0340 10/07/16 0428  WBC 20.5* 17.7* 15.1*  HGB 9.0* 8.3* 8.3*  HCT 26.5* 24.8* 24.1*  PLT 178 178 154   BMET  Recent Labs  10/05/16 1228 10/06/16 0340 10/07/16 0428  NA 130* 129* 129*  K 3.4* 3.2* 4.1  CL 99* 97* 98*  CO2 19* 21* 20*  GLUCOSE  122* 102* 102*  BUN 14 12 15   CREATININE 0.84 0.67 0.72  CALCIUM 7.8* 7.6* 7.4*   LFT  Recent Labs  10/05/16 1228 10/06/16 0340 10/07/16 0428  PROT 6.7 6.3* 6.6  ALBUMIN 2.4* 2.2* 2.0*  AST 88* 73* 77*  ALT 61 51 47  ALKPHOS 86 76 76  BILITOT 9.4* 8.0* 7.5*   PT/INR  Recent Labs  10/07/16 0428  LABPROT 36.9*  INR 3.62   Hepatitis Panel No results for input(s): HEPBSAG, HCVAB, HEPAIGM, HEPBIGM in the last 72 hours.  Studies/Results: Dg Chest 2 View  Result Date: 10/05/2016 CLINICAL DATA:  Leukocytosis. EXAM: CHEST  2 VIEW COMPARISON:  Radiographs of Oct 04, 2016. FINDINGS: Stable cardiomediastinal silhouette. No pneumothorax is noted. Stable elevated right hemidiaphragm is noted. Right infrahilar atelectasis or infiltrate is noted. Mildly improved left basilar atelectasis or infiltrate is noted. Bony thorax is unremarkable. No significant pleural effusion is noted. IMPRESSION: Improved left basilar subsegmental atelectasis or infiltrate. Right infrahilar atelectasis or infiltrate. Electronically Signed   By: Lupita Raider, M.D.   On: 10/05/2016 15:30   Dg Abd 1 View  Result Date: 10/05/2016 CLINICAL DATA:  Persistent abdominal pain and distention for approximately 3 weeks EXAM: ABDOMEN - 1 VIEW COMPARISON:  Oct 04, 2016 FINDINGS: Mild generalized bowel dilatation remains. Best boy  is noted in the rectum. There are no demonstrable air-fluid levels. No free air. No abnormal calcifications. Apparent rectal thermometer in place. IMPRESSION: Persistent bowel dilatation. Suspect ileus. Bowel obstruction felt to be less likely. No free air evident. Electronically Signed   By: Bretta Bang III M.D.   On: 10/05/2016 11:49   Mr Brain Wo Contrast  Result Date: 10/05/2016 CLINICAL DATA:  51 y/o M; encephalopathy with history of upper GI bleed, cirrhosis, metabolic derangement, alcohol abuse, and probable withdrawal. Concern for Wernicke encephalopathy. EXAM: MRI HEAD WITHOUT CONTRAST  TECHNIQUE: Multiplanar, multiecho pulse sequences of the brain and surrounding structures were obtained without intravenous contrast. COMPARISON:  10/04/2016 CT head. FINDINGS: Brain: No acute infarction, hemorrhage, hydrocephalus, extra-axial collection or mass lesion. No specific findings of Wernicke encephalopathy. Mild diffuse supratentorial and infratentorial parenchymal volume loss. Vascular: Normal flow voids. Skull and upper cervical spine: Normal marrow signal. Sinuses/Orbits: Small mucous retention cyst in the right maxillary sinus postsurgical changes related to partial ethmoidectomy and maxillary antrostomy. Small left mastoid air cell effusion. Orbits are unremarkable. The Other: None. IMPRESSION: 1. No acute intracranial abnormality. No specific findings of Wernicke encephalopathy. 2. Mild diffuse supratentorial and infratentorial parenchymal volume loss. 3. Small left mastoid air cell effusion. Electronically Signed   By: Mitzi Hansen M.D.   On: 10/05/2016 20:06   Ir Abdomen US Limited  Result Date: 10/06/2016 CLINICAL DATA:  History of alcoholic cirrhosis with abdominal distention. Request is made for evaluation for paracentesis. EXAM: LIMITED ABDOMINAL ULTRASOUND COMPARISON:  None. FINDINGS: Minimal ascites noted in the right lower quadrant. Otherwise dilated bowel consistent with an ileus pattern which was seen on plain abdominal films yesterday. IMPRESSION: Minimal ascites.  Not enough fluid for paracentesis. Read by: Barnetta Chapel, PA-C Electronically Signed   By: Richarda Overlie M.D.   On: 10/06/2016 11:52   Scheduled Meds: . feeding supplement  1 Container Oral TID BM  . feeding supplement (ENSURE ENLIVE)  237 mL Oral BID BM  . folic acid  1 mg Oral Daily  . furosemide  40 mg Oral Daily  . lactulose  30 g Oral TID  . mouth rinse  15 mL Mouth Rinse BID  . multivitamin with minerals  1 tablet Oral Daily  . nicotine  21 mg Transdermal Daily  . pantoprazole  40 mg Oral Daily    . rifaximin  550 mg Oral BID  . sodium chloride flush  3 mL Intravenous Q12H  . spironolactone  150 mg Oral Daily  . thiamine injection  250 mg Intravenous Daily   Continuous Infusions: . magnesium sulfate 1 - 4 g bolus IVPB 2 g (10/07/16 0759)  . metoCLOPramide (REGLAN) injection Stopped (10/07/16 0615)  . piperacillin-tazobactam (ZOSYN)  IV 3.375 g (10/07/16 0547)   PRN Meds:.acetaminophen, fentaNYL (SUBLIMAZE) injection, sodium chloride flush  ASSESMENT:   * Encephalopathy, multifactorial. HE with mild elevation Ammonia.  Lactulose and Rifaximin in place.  Head CT and MRI unremarkable/unrevealing. Day 5 daily 250 mg IV Thiamine, however Neurology does not feel this is Wernicke's encephalopathy.    * Ileus. SB and colon. Ileus, not ascites, is source to abd distention.  IV Reglan continues through noon 5/5, this may be adding to stool frequency.      *  ETOH cirrhosis. 4 days ETOH rehab at [facility], interrupted by onset of hematemesis 4/18.  * Leukocytosis, improved but persists.  Right lung infiltrate.  Zosyn day 3.    *  Hematuria. U/A 5/3: TNTC RBCs but only  0 - 5 WBCs.  Unremarkable kidneys, bladder on CT 4/18.   Renal function not compromised.      * Variceal bleed. 4/18 EGD: grade 3, bleeding esophageal varices, s/p banding x 4.  RBPR reported yesterday afternoon.  Had colonoscopy with adenomatous polyp in 07/2015.  Only able to find the path report on the polyp, not the actual procedure report so not known if he had diverticulosis, hemorrhoids, AVMs.  CT of 4/18 with pan-colonic wall thickening which was attributed to depleted protein state and hypoalbuminemic state.   * Ascites. 2.1 liter tap 4/26 with cell count, clx not c/w SBP.  Reattempted tap 5/3: minimal ascites, too small to tap.  *  Hyponatremia.    *  Hypotension.  * Coagulopathy. Worsening . Did not respond to IV vitamin K. Pre EGD, received FFP x 3.   * Blood loss anemia  and suspect anemia of chronic dz. Stable. s/p PRBC x 5.   * Persistent sinus tachycardia.  Present at rest and worsened by activity.      *  Debilitation, deconditioning.      PLAN   *  With hypotension and minimal ascites, will drop aldactone to 100 mg daily, leave Lasix at 40 mg.  *  Switch to po Thiamine.  Reduce Lactulose to 20 gm TID.    *  Advance to regular diet.  His cognitive status and AMS have improved to where D3 diet unnecessary.   *  If BP would tolerate, wonder about starting non-selective BB for rate control and for his portal hypertension, hx variceal bleeding?    *  Will need repeat EGD and possible repeat variceal banding with Harlen LabsJason Conway MD, [place]   *  Likely d/c to SNF, ? Return to ETOH rehab after that.  Pt at high risk for resuming ETOH: impulisive and still drank heavily despite his diagnosis of cirrhosis in 06/2015 and subsequent variceal bleed 04/2016.    Addendum 12:15 PM:  RN called to say pt passed large amount of red blood into the commode.  He was asymptomatic, vitals unchanged.  Ordered Anusol HC suppositories.  Repeat EGD tomorrow 11AM.  Since he had some boost (not clear liquid) anesthesia insits on 6 hour wait, so rather than add on tonight, will be done in AM.  In meantime, adding octreotide bolus and drip in case this is variceal bleeding.     CBC this afternoon and in AM.  .    Jennye MoccasinSarah Jensen [place]  10/07/2016, 8:14 AM Pager: [phone]

## 2016-10-07 NOTE — Progress Notes (Signed)
Report given to the nurse on [facility]. Pt transferred to [facility] at 1815 via bed.

## 2016-10-07 NOTE — Anesthesia Preprocedure Evaluation (Signed)
Anesthesia Evaluation  Patient identified by MRN, date of birth, ID band Patient awake    Reviewed: Allergy & Precautions, H&P , NPO status , [name] Chart, lab work & pertinent test results  Airway        Dental no notable dental hx.    Pulmonary neg pulmonary ROS, Current Smoker,    Pulmonary exam normal        Cardiovascular hypertension, negative cardio ROS       Neuro/Psych Anxiety negative neurological ROS  negative psych ROS   GI/Hepatic negative GI ROS, Neg liver ROS, (+) Cirrhosis       ,   Endo/Other  negative endocrine ROS  Renal/GU negative Renal ROS  negative genitourinary   Musculoskeletal   Abdominal   Peds  Hematology negative hematology ROS (+) anemia ,   Anesthesia Other Findings   Reproductive/Obstetrics negative OB ROS                             Anesthesia Physical Anesthesia Plan  ASA: III  Anesthesia Plan: MAC   Post-op Pain Management:    Induction: Intravenous  Airway Management Planned: Nasal Cannula  Additional Equipment:   Intra-op Plan:   Post-operative Plan:   Informed Consent: I have reviewed the patients History and Physical, chart, labs and discussed the procedure including the risks, benefits and alternatives for the proposed anesthesia with the patient or authorized representative who has indicated his/her understanding and acceptance.   Dental advisory given  Plan Discussed with: CRNA  Anesthesia Plan Comments:         Anesthesia Quick Evaluation

## 2016-10-07 NOTE — Progress Notes (Signed)
Addendum  Informed by RN that patient just passed 2 large bloody stools. Hemoglobin has dropped to 7.1. Vital signs stable/unchanged. Patient alert and oriented 2.  Assessment and plan  Recurrent GI bleed, upper versus lower: [facility] aware and plan for EGD and flexible sigmoidoscopy on 5/5 at 11:15. Acute blood loss anemia: Secondary to GI bleed. Transfuse 2 units of PRBCs and follow posttransfusion CBCs and transfuse as needed to keep hemoglobin >7 g per DL.  Due to increased acuity of illness, need for close monitoring and management, transfer to stepdown unit. Discussed with patient's RN.  Marcellus ScottHONGALGI,Annaelle Kasel, MD, FACP, FHM. [facility] Pager [phone]  If 7PM-7AM, please contact night-coverage [email or web address] Password [email or web address] 10/07/2016, 4:31 PM

## 2016-10-07 NOTE — Progress Notes (Signed)
Pt's RN has informed GI that pt has passed bloody stool x 2 this afternoon, looks/sounds like hematochezia.  Repeat CBC drawn just before 4 PM.   Added flex sig to the planned EGD for 11:15 tomorrow.  Tried to call pt's room to speak with and update him but no answer.  Jennye MoccasinSarah Jamee Pacholski PA-C

## 2016-10-07 NOTE — Progress Notes (Signed)
Pt stool at 1206 was filled with red blood. Ray Hayden has been notified.

## 2016-10-07 NOTE — Progress Notes (Signed)
PROGRESS NOTE    Ray Hayden  WUJ:[id number] DOB: 05/06/1966 DOA: 09/21/2016 PCP: Marcelo BaldyALEXANDER,MEGAN, MD    Brief Narrative: 51 year old alcoholic male with cirrhosis who presents with UGI bleed due to varices that was banded.  Patient was intubated for alcohol withdrawal and EGD. He was extubated on 4/24 and precedex discontinued on 4/25. [facility]. [facility], patient has been in out of confusion and encephalopathic. He has completed total of 9 days of IV antibiotics for possible aspiration pneumonia. In view of multiple admissions, non compliant to medications, alcohol abuse, palliative care consulted for goals of care. GI has been following the patient for upper GI bleed, ascites and encephalopathy.   Assessment & Plan:   Active Problems:   GI bleed   LFTs abnormal   Encephalopathy, hepatic (HCC)   Liver disease, chronic, with cirrhosis (HCC)   Respiratory failure (HCC)   Abdominal distension   Goals of care, counseling/discussion   Palliative care encounter   Variceal Upper GI bleed in cirrhotic with previous hx GI bleeds,  EGDs and banding of esophageal varices dating 12/2015 - 07/2016 [facility].  EGD 4/18: grade 3 esophageal varices with active bleeding. Banded x 4.  On Protonix 40 MG daily. No improvement of coagulopathy with vitamin k.  As per RN report, noted couple of streaks of blood after his fourth BM on 5/3. Again per RN report on 5/4, patient passed large amount of red blood into the commode. GI aware. Anusol HC suppositories ordered. Resumed octreotide infusion. Scheduled for repeat EGD 10/08/16 at 11 AM for potential variceal bleeding.  Anemia/ABLA - Stable. However in the context of newly reported GI bleeding, follow CBCs closely and transfuse if hemoglobin less than or equal to 7 g per DL. - Status post PRBCs 5 units thus far this admission.  Alcoholic liver disease with cirrhosis and ascites, coagulopathy;  US  paracentesis by IR on 4/26, only 2.1 liters removed. Course complicated by small bowel ileus mostly contributing to significant abdominal distention rather than small volume ascites by ultrasound-hence I am unable to safely attempt diagnostic paracentesis.   Continue rifaximin. Due to hypotension and minimal ascites, GI has reduced Aldactone to 100 MG daily but continue Lasix 40 daily. Also reduce lactulose to 20 MG 3 times a day and switched thiamine to by mouth.  Remains confused .  Remains on empiric Zosyn for presumed SBP.  Acute hepatic encephalopathy:  Continue rifaximin and lactulose. Treating for possible SBP.  CT head > no acute abnormalities noted. MRI brain without acute findings or findings suggestive of Wernicke's encephalopathy. 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 Improving. Lactulose dose reduced.  Partial SBO/ileus:  Discussion as above. Now having multiple BMs and passing flatus. Hopefully resolving.  ETOH dependence, with ETOH hepatitis and shock: improving.  Social worker consulted. PLAN FOR SNF on discharge.   Acute respiratory failure possibly from aspiration pneumonia.  Hypoxia resolved. Back on antibiotics for suspected pneumonia  Hypokalemia/hypomagnesemia:  Potassium replaced. Replacing magnesium. Follow BMP.  Leukocytosis:  ? Aspiration, get procalcitonin levels > elevated 0.31>0.27.  Afebrile. Monitor.  Chest x-ray 10/05/16 showed improved left basilar subsegmental atelectasis or infiltrate. Right infrahilar atelectasis or infiltrate. Urine microscopy results appreciated, although turbid, no bacteria seen and only 0-5 WBCs. RBCs probably traumatic from Foley and due to coagulopathy. Non on Zosyn. Better. Follow.  Adult failure to thrive inview of multiple co morbidities, multiple admissions, worsening liver issues,  Dr. Blake Divine discussed with the brothers and called palliative care consult for goals of care .    Palliative care input appreciated. Discussed with them. They had recommended repeat paracentesis which is as discussed above. Morphine was changed to fentanyl when necessary to try and minimize opioids in system. For now, full scope of treatment. Overall prognosis poor.  Penile pain/microscopic hematuria - No acute findings except penile edema. Suspect catheter is causing his pain. Urine appearance mostly suggestive of bile rather than frank hematuria. DC Foley catheter and monitor.  Sinus tachycardia, mild Multifactorial related to multiple acute medical conditions, deconditioning. Asymptomatic.  Debilitation/deconditioning - SNF for discharge.  Hyponatremia - Possibly due to hypervolemia/anasarca related to liver disease. Stable   DVT prophylaxis: SCD'S Code Status: full code.  Family Communication: None at bedside Disposition Plan: SNF on discharge,    Consultants:   Gastroenterology  PCCM.   Neurology   Procedures:  EGD on 4/18  Paracentesis  Foley catheter-? Time to remove.   Antimicrobials:   zosyn completed 5 days, rocephin and zithromax. 4 days.   Now on IV Zosyn.   Subjective: Seen this morning. Alert and oriented 2. Continues to be more coherent day by day. Multiple BMs-one witness this morning was brown, not melanotic and no frank blood. Subsequently reported by RN that blood noted in commode.  Objective: Vitals:   10/07/16 0013 10/07/16 0054 10/07/16 0416 10/07/16 1255  BP: 102/63  110/64 (!) 98/49  Pulse: (!) 106  (!) 110 (!) 114  Resp:   18 18  Temp:  99.3 F (37.4 C) 99 F (37.2 C)   TempSrc:  Oral Oral   SpO2:   95% 96%  Weight:   106.8 kg (235 lb 7.2 oz)   Height:        Intake/Output Summary (Last 24 hours) at 10/07/16 1346 Last data filed at 10/07/16 1206  Gross per 24 hour  Intake              841 ml  Output              878 ml  Net              -37 ml   Filed Weights   10/05/16 0500 10/06/16 0508 10/07/16 0416  Weight:  102.8 kg (226 lb 10.1 oz) 103 kg (227 lb 1.2 oz) 106.8 kg (235 lb 7.2 oz)    Examination:  General exam: 51 year old male, moderately built and poorly nourished, chronically ill looking, sitting up on commode this morning. Scleral icterus. Respiratory system: Diminished breath sounds in the bases with occasional basal crackles but otherwise clear to auscultation. No increased work of breathing. Cardiovascular system: S1 & S2 heard, RRR.  2 +  pedal edema/anasarca/edema extending to external genitalia. Telemetry: Sinus tachycardia in the 100'-110s. Gastrointestinal system: Abdomen still significantly distended but less compared to yesterday, tympanitic, nontender. Penoscrotal edema. Foley catheter in place. No acute findings. Improved bowel sounds heard. Central nervous system: Alert and oriented 2. No focal neurological deficits. Skin: No rashes, lesions or ulcers Psychiatry: Pleasant and answers questions appropriately.    Data Reviewed: I have personally reviewed following labs and imaging studies  CBC:  Recent Labs Lab 10/04/16 0403 10/05/16 1228 10/06/16 0340 10/07/16 0428  WBC 19.6* 20.5* 17.7* 15.1*  HGB 9.0* 9.0* 8.3* 8.3*  HCT 26.6* 26.5* 24.8* 24.1*  MCV 95.3 94.6 93.6 94.9  PLT 164 178 178 154   Basic Metabolic Panel:  Recent Labs Lab 10/01/16 1410  10/05/16 1228 10/06/16 0340 10/07/16 0428  NA 138 130* 129* 129*  K 4.1 3.4* 3.2* 4.1  CL 106 99* 97* 98*  CO2 18* 19* 21* 20*  GLUCOSE 113* 122* 102* 102*  BUN 11 14 12 15   CREATININE 0.84 0.84 0.67 0.72  CALCIUM 8.0* 7.8* 7.6* 7.4*  MG  --   --   --  1.6*   GFR: Estimated Creatinine Clearance: 138 mL/min (by C-G formula based on SCr of 0.72 mg/dL). Liver Function Tests:  Recent Labs Lab 10/03/16 0447 10/05/16 1228 10/06/16 0340 10/07/16 0428  AST 85* 88* 73* 77*  ALT 72* 61 51 47  ALKPHOS 80 86 76 76  BILITOT 8.0* 9.4* 8.0* 7.5*  PROT 6.5 6.7 6.3* 6.6  ALBUMIN 2.5* 2.4* 2.2* 2.0*     Recent Labs Lab 10/04/16 0403 10/07/16 0428  AMMONIA 56* 42*   Coagulation Profile:  Recent Labs Lab 10/04/16 0403 10/07/16 0428  INR 2.35 3.62   CBG:  Recent Labs Lab 10/03/16 0047 10/03/16 0433 10/03/16 0802 10/03/16 1131 10/03/16 1622  GLUCAP 108* 99 104* 123* 103*   Sepsis Labs:  Recent Labs Lab 10/04/16 0701 10/04/16 1247 10/05/16 1228 10/07/16 0428  PROCALCITON  --  0.31 0.27 0.37  LATICACIDVEN 2.4*  --   --   --     Recent Results (from the past 240 hour(s))  Culture, body fluid-bottle     Status: None   Collection Time: 09/29/16 12:14 PM  Result Value Ref Range Status   Specimen Description PERITONEAL  Final   Special Requests NONE  Final   Culture NO GROWTH 5 DAYS  Final   Report Status 10/04/2016 FINAL  Final  Gram stain     Status: None   Collection Time: 09/29/16 12:14 PM  Result Value Ref Range Status   Specimen Description PERITONEAL  Final   Special Requests NONE  Final   Gram Stain NO WBC SEEN NO ORGANISMS SEEN   Final   Report Status 09/29/2016 FINAL  Final         Radiology Studies: Dg Chest 2 View  Result Date: 10/05/2016 CLINICAL DATA:  Leukocytosis. EXAM: CHEST  2 VIEW COMPARISON:  Radiographs of Oct 04, 2016. FINDINGS: Stable cardiomediastinal silhouette. No pneumothorax is noted. Stable elevated right hemidiaphragm is noted. Right infrahilar atelectasis or infiltrate is noted. Mildly improved left basilar atelectasis or infiltrate is noted. Bony thorax is unremarkable. No significant pleural effusion is noted. IMPRESSION: Improved left basilar subsegmental atelectasis or infiltrate. Right infrahilar atelectasis or infiltrate. Electronically Signed   By: Lupita Raider, M.D.   On: 10/05/2016 15:30   Mr Brain Wo Contrast  Result Date: 10/05/2016 CLINICAL DATA:  51 y/o M; encephalopathy with history of upper GI bleed, cirrhosis, metabolic derangement, alcohol abuse, and probable withdrawal. Concern for Wernicke encephalopathy.  EXAM: MRI HEAD WITHOUT CONTRAST TECHNIQUE: Multiplanar, multiecho pulse sequences of the brain and surrounding structures were obtained without intravenous contrast. COMPARISON:  10/04/2016 CT head. FINDINGS: Brain: No acute infarction, hemorrhage, hydrocephalus, extra-axial collection or mass lesion. No specific findings of Wernicke encephalopathy. Mild diffuse supratentorial and infratentorial parenchymal volume loss. Vascular: Normal flow voids. Skull and upper cervical spine: Normal marrow signal. Sinuses/Orbits: Small mucous retention cyst in the right maxillary sinus postsurgical changes related to partial ethmoidectomy and maxillary antrostomy. Small left mastoid air cell effusion. Orbits are unremarkable. The Other: None. IMPRESSION: 1. No acute intracranial abnormality. No specific findings of Wernicke encephalopathy. 2. Mild diffuse supratentorial and infratentorial parenchymal volume loss.  3. Small left mastoid air cell effusion. Electronically Signed   By: Mitzi Hansen M.D.   On: 10/05/2016 20:06   Ir Abdomen US Limited  Result Date: 10/06/2016 CLINICAL DATA:  History of alcoholic cirrhosis with abdominal distention. Request is made for evaluation for paracentesis. EXAM: LIMITED ABDOMINAL ULTRASOUND COMPARISON:  None. FINDINGS: Minimal ascites noted in the right lower quadrant. Otherwise dilated bowel consistent with an ileus pattern which was seen on plain abdominal films yesterday. IMPRESSION: Minimal ascites.  Not enough fluid for paracentesis. Read by: Barnetta Chapel, PA-C Electronically Signed   By: Richarda Overlie M.D.   On: 10/06/2016 11:52        Scheduled Meds: . feeding supplement  1 Container Oral TID BM  . feeding supplement (ENSURE ENLIVE)  237 mL Oral BID BM  . folic acid  1 mg Oral Daily  . furosemide  40 mg Oral Daily  . hydrocortisone  25 mg Rectal BID  . lactulose  20 g Oral TID  . mouth rinse  15 mL Mouth Rinse BID  . multivitamin with minerals  1 tablet Oral  Daily  . nicotine  21 mg Transdermal Daily  . octreotide  50 mcg Intravenous Once  . pantoprazole  40 mg Oral Daily  . rifaximin  550 mg Oral BID  . sodium chloride flush  3 mL Intravenous Q12H  . spironolactone  100 mg Oral Daily  . thiamine  100 mg Oral Daily   Continuous Infusions: . metoCLOPramide (REGLAN) injection Stopped (10/07/16 0615)  . octreotide  (SANDOSTATIN)    IV infusion    . piperacillin-tazobactam (ZOSYN)  IV Stopped (10/07/16 0947)     LOS: 16 days    Time spent: 35 minutes.    Marcellus Scott, MD, FACP, FHM. [facility] Pager [phone]  If 7PM-7AM, please contact night-coverage [email or web address] Password [email or web address] 10/07/2016, 1:46 PM

## 2016-10-08 ENCOUNTER — Inpatient Hospital Stay (HOSPITAL_COMMUNITY): Payer: PRIVATE HEALTH INSURANCE | Admitting: Certified Registered Nurse Anesthetist

## 2016-10-08 ENCOUNTER — Encounter (HOSPITAL_COMMUNITY)
Admission: EM | Disposition: A | Discharge status: Skilled Nursing Facility | Payer: Self-pay | Source: Home / Self Care | Attending: Pulmonary Disease

## 2016-10-08 ENCOUNTER — Encounter (HOSPITAL_COMMUNITY): Payer: Self-pay | Admitting: Internal Medicine

## 2016-10-08 DIAGNOSIS — K7030 Alcoholic cirrhosis of liver without ascites: Secondary | ICD-10-CM

## 2016-10-08 DIAGNOSIS — D696 Thrombocytopenia, unspecified: Secondary | ICD-10-CM

## 2016-10-08 HISTORY — PX: ESOPHAGOGASTRODUODENOSCOPY: SHX5428

## 2016-10-08 HISTORY — PX: FLEXIBLE SIGMOIDOSCOPY: SHX5431

## 2016-10-08 LAB — CBC
HEMATOCRIT: 20.9 % — AB (ref 39.0–52.0)
HEMATOCRIT: 23.7 % — AB (ref 39.0–52.0)
HEMOGLOBIN: 7.2 g/dL — AB (ref 13.0–17.0)
HEMOGLOBIN: 8.3 g/dL — AB (ref 13.0–17.0)
MCH: 31.6 pg (ref 26.0–34.0)
MCH: 31.9 pg (ref 26.0–34.0)
MCHC: 34.4 g/dL (ref 30.0–36.0)
MCHC: 35 g/dL (ref 30.0–36.0)
MCV: 91.7 fL (ref 78.0–100.0)
MCV: 91.2 fL (ref 78.0–100.0)
Platelets: 120 10*3/uL — ABNORMAL LOW (ref 150–400)
Platelets: 114 10*3/uL — ABNORMAL LOW (ref 150–400)
RBC: 2.28 MIL/uL — ABNORMAL LOW (ref 4.22–5.81)
RBC: 2.6 MIL/uL — AB (ref 4.22–5.81)
RDW: 19.6 % — AB (ref 11.5–15.5)
RDW: 19.4 % — ABNORMAL HIGH (ref 11.5–15.5)
WBC: 15.7 10*3/uL — ABNORMAL HIGH (ref 4.0–10.5)
WBC: 14.4 10*3/uL — AB (ref 4.0–10.5)

## 2016-10-08 LAB — BASIC METABOLIC PANEL
ANION GAP: 10 (ref 5–15)
BUN: 29 mg/dL — ABNORMAL HIGH (ref 6–20)
CHLORIDE: 99 mmol/L — AB (ref 101–111)
CO2: 18 mmol/L — ABNORMAL LOW (ref 22–32)
Calcium: 7.3 mg/dL — ABNORMAL LOW (ref 8.9–10.3)
Creatinine, Ser: 0.96 mg/dL (ref 0.61–1.24)
GFR calc Af Amer: >60 mL/min (ref >60)
Glucose, Bld: 113 mg/dL — ABNORMAL HIGH (ref 65–99)
POTASSIUM: 4.2 mmol/L (ref 3.5–5.1)
SODIUM: 127 mmol/L — AB (ref 135–145)

## 2016-10-08 LAB — PREPARE RBC (CROSSMATCH)

## 2016-10-08 LAB — MAGNESIUM: MAGNESIUM: 1.8 mg/dL (ref 1.7–2.4)

## 2016-10-08 LAB — PROTIME-INR
INR: 3.06
Prothrombin Time: 32.3 seconds — ABNORMAL HIGH (ref 11.4–15.2)

## 2016-10-08 SURGERY — EGD (ESOPHAGOGASTRODUODENOSCOPY)
Anesthesia: Monitor Anesthesia Care

## 2016-10-08 MED ORDER — PHENYLEPHRINE HCL 10 MG/ML IJ SOLN
INTRAMUSCULAR | Status: DC | PRN
  Administered 2016-10-08: 80 ug via INTRAVENOUS
  Administered 2016-10-08: 200 ug via INTRAVENOUS

## 2016-10-08 MED ORDER — NYSTATIN 100000 UNIT/GM EX OINT
TOPICAL_OINTMENT | Freq: Three times a day (TID) | CUTANEOUS | Status: DC
  Administered 2016-10-08: 17:00:00 via TOPICAL
  Administered 2016-10-08: 1 via TOPICAL
  Administered 2016-10-09 (×3): via TOPICAL
  Administered 2016-10-10: 1 via TOPICAL
  Administered 2016-10-13: 09:00:00 via TOPICAL
  Filled 2016-10-08 (×2): qty 15

## 2016-10-08 MED ORDER — LIDOCAINE HCL (CARDIAC) 20 MG/ML IV SOLN
INTRAVENOUS | Status: DC | PRN
  Administered 2016-10-08: 100 mg via INTRATRACHEAL

## 2016-10-08 MED ORDER — BUTAMBEN-TETRACAINE-BENZOCAINE 2-2-14 % EX AERO
INHALATION_SPRAY | CUTANEOUS | Status: DC | PRN
  Administered 2016-10-08: 1 via TOPICAL

## 2016-10-08 MED ORDER — LACTATED RINGERS IV SOLN
INTRAVENOUS | Status: DC
  Administered 2016-10-08: 1000 mL via INTRAVENOUS

## 2016-10-08 MED ORDER — DEXTROSE 5 % IV SOLN
INTRAVENOUS | Status: DC | PRN
  Administered 2016-10-08: 80 ug/min via INTRAVENOUS

## 2016-10-08 MED ORDER — SODIUM CHLORIDE 0.9 % IV SOLN
Freq: Once | INTRAVENOUS | Status: AC
  Administered 2016-10-08: 12:00:00 via INTRAVENOUS

## 2016-10-08 MED ORDER — PROPOFOL 500 MG/50ML IV EMUL
INTRAVENOUS | Status: DC | PRN
  Administered 2016-10-08: 75 ug/kg/min via INTRAVENOUS

## 2016-10-08 MED ORDER — PROPOFOL 10 MG/ML IV BOLUS
INTRAVENOUS | Status: DC | PRN
  Administered 2016-10-08: 40 mg via INTRAVENOUS

## 2016-10-08 NOTE — Transfer of Care (Signed)
Immediate Anesthesia Transfer of Care Note  Patient: Ray Hayden  Procedure(s) Performed: Procedure(s): ESOPHAGOGASTRODUODENOSCOPY (EGD) (N/A) FLEXIBLE SIGMOIDOSCOPY (N/A)  Patient Location: Endoscopy Unit  Anesthesia Type:MAC  Level of Consciousness: awake and patient cooperative  Airway & Oxygen Therapy: Patient Spontanous Breathing  Post-op Assessment: Report given to RN and Post -op Vital signs reviewed and stable  Post vital signs: Reviewed and stable  Last Vitals:  Vitals:   10/08/16 1434 10/08/16 1454  BP: 110/62 (!) 100/50  Pulse: (!) 106 (!) 104  Resp: (!) 31 (!) 31  Temp: 37 C 36.8 C    Last Pain:  Vitals:   10/08/16 1454  TempSrc: Oral  PainSc:       Patients Stated Pain Goal: 4 (97/67/34 1937)  Complications: No apparent anesthesia complications

## 2016-10-08 NOTE — Op Note (Signed)
[facility] Patient Name: Ray Hayden Procedure Date : 10/08/2016 MRN: [id number] Attending MD: Beverley Fiedler , MD Date of Birth: 1965/07/13 CSN: [id number] Age: 51 Admit Type: Inpatient Procedure:                Flexible Sigmoidoscopy Indications:              Hematochezia Providers:                Carie Caddy. Rhea Belton, MD, Dwain Sarna, RN, Kandice Robinsons, Technician Referring MD:             [facility] Medicines:                Monitored Anesthesia Care Complications:            No immediate complications. Estimated Blood Loss:     Estimated blood loss: none. Procedure:                Pre-Anesthesia Assessment:                           - Prior to the procedure, a History and Physical                            was performed, and patient medications and                            allergies were reviewed. The patient's tolerance of                            previous anesthesia was also reviewed. The risks                            and benefits of the procedure and the sedation                            options and risks were discussed with the patient.                            All questions were answered, and informed consent                            was obtained. Prior Anticoagulants: The patient has                            taken no previous anticoagulant or antiplatelet                            agents. ASA Grade Assessment: IV - A patient with                            severe systemic disease that is a constant threat  to life. After reviewing the risks and benefits,                            the patient was deemed in satisfactory condition to                            undergo the procedure.                           After obtaining informed consent, the scope was                            passed under direct vision. The EG-[id number] ([id number])                            scope was introduced through  the anus and advanced                            to the the left transverse colon. The flexible                            sigmoidoscopy was accomplished without difficulty.                            The patient tolerated the procedure well. The                            quality of the bowel preparation was good. Scope In: 4:02:18 PM Scope Out: 4:09:29 PM Total Procedure Duration: 0 hours 7 minutes 11 seconds  Findings:      The perianal exam findings include non-thrombosed internal hemorrhoids,       internal hemorrhoids that prolapse with straining, but require manual       replacement into the anal canal (Grade III) and a perianal fungal rash.      A few sessile polyps were found in the rectum and recto-sigmoid colon.       The polyps were diminutive in size and hyperplastic appearing.       Polypectomy was not attempted due to the patient's condition and low       risk of these polyps.      Edematous mucosa without erythema or colitis was found in the sigmoid       colon, in the descending colon and in the transverse colon. This is felt       related to portal hypertension and hypoalbuminemia.      Scant and scattered clotted blood was found in the sigmoid colon, in the       descending colon and in the transverse colon. No colonic bleeding source       found and no diverticulosis seen.      Non-bleeding internal hemorrhoids were found during retroflexion, during       perianal exam and during digital exam. The hemorrhoids were medium-sized. Impression:               - Fungal rash found on perianal exam.                           -  A few diminutive polyps in the rectum and at the                            recto-sigmoid colon. Resection not attempted.                           - Edematous mucosa in the sigmoid colon, in the                            descending colon and in the transverse colon due to                            portal hypertension and hypoalbuminemia. no                             evidence of colitis                           - Scant and scattered, old/clotted blood in the                            sigmoid colon, in the descending colon and in the                            transverse colon. Likely from a proximal GI source,                            with current knowledge most likely from                            intermittent esophageal variceal hemorrhage (see                            EGD report).                           - Non-bleeding internal hemorrhoids.                           - No specimens collected. Moderate Sedation:      N/A Recommendation:           - Return patient to hospital ward for ongoing care.                           - Continue present medications.                           - See EGD report.                           - Nystatin ointment TID to fungal perianal skin                            rash.                           -  Clear liquid diet. Advance diet as tolerated. Procedure Code(s):        --- Professional ---                           [id number], Sigmoidoscopy, flexible; diagnostic,                            including collection of specimen(s) by brushing or                            washing, when performed (separate procedure) Diagnosis Code(s):        --- Professional ---                           K62.1, Rectal polyp                           D12.7, Benign neoplasm of rectosigmoid junction                           K63.89, Other specified diseases of intestine                           K92.2, Gastrointestinal hemorrhage, unspecified                           K64.2, Third degree hemorrhoids                           B35.6, Tinea cruris                           K92.1, Melena (includes Hematochezia) CPT copyright 2016 American Medical Association. All rights reserved. The codes documented in this report are preliminary and upon coder review may  be revised to meet current compliance requirements. Beverley Fiedler,  MD 10/08/2016 4:30:17 PM This report has been signed electronically. Number of Addenda: 0

## 2016-10-08 NOTE — Progress Notes (Signed)
Pt bladderscanned and revealed >999 mLs. MD notified and ordered foley for retention. Foley placed and put out about 200 mLs. Pressure breakdown noted prior to present foley insertion.

## 2016-10-08 NOTE — Anesthesia Preprocedure Evaluation (Signed)
Anesthesia Evaluation  Patient identified by MRN, date of birth, ID band Patient awake    Reviewed: Allergy & Precautions, NPO status , [name] Chart, lab work & pertinent test results  Airway Mallampati: II  TM Distance: >3 FB Neck ROM: Full    Dental  (+) Teeth Intact, Dental Advisory Given   Pulmonary neg pulmonary ROS, Current Smoker,    Pulmonary exam normal breath sounds clear to auscultation       Cardiovascular hypertension, Pt. on medications Normal cardiovascular exam Rhythm:Regular Rate:Normal     Neuro/Psych negative neurological ROS     GI/Hepatic GERD  Medicated,(+) Cirrhosis   Esophageal Varices and ascites  substance abuse  alcohol use, GIB   Endo/Other  negative endocrine ROS  Renal/GU negative Renal ROS     Musculoskeletal negative musculoskeletal ROS (+)   Abdominal   Peds  Hematology  (+) Blood dyscrasia (Thrombocytopenia), anemia ,   Anesthesia Other Findings Day of surgery medications reviewed with the patient.  Reproductive/Obstetrics                             Anesthesia Physical Anesthesia Plan  ASA: III  Anesthesia Plan: MAC   Post-op Pain Management:    Induction: Intravenous  Airway Management Planned: Nasal Cannula  Additional Equipment:   Intra-op Plan:   Post-operative Plan:   Informed Consent: I have reviewed the patients History and Physical, chart, labs and discussed the procedure including the risks, benefits and alternatives for the proposed anesthesia with the patient or authorized representative who has indicated his/her understanding and acceptance.   Dental advisory given  Plan Discussed with: CRNA and Anesthesiologist  Anesthesia Plan Comments: (Discussed risks/benefits/alternatives to MAC sedation including need for ventilatory support, hypotension, need for conversion to general anesthesia.  All patient questions answered.   Patient/guardian wishes to proceed.)        Anesthesia Quick Evaluation

## 2016-10-08 NOTE — Anesthesia Procedure Notes (Signed)
Procedure Name: MAC Date/Time: 10/08/2016 3:41 PM Performed by: Lance Coon Pre-anesthesia Checklist: Patient identified, Emergency Drugs available, Suction available, Patient being monitored and Timeout performed Oxygen Delivery Method: Nasal cannula

## 2016-10-08 NOTE — Anesthesia Postprocedure Evaluation (Signed)
Anesthesia Post Note  Patient: Ray Hayden  Procedure(s) Performed: Procedure(s) (LRB): ESOPHAGOGASTRODUODENOSCOPY (EGD) (N/A) FLEXIBLE SIGMOIDOSCOPY (N/A)  Patient location during evaluation: Endoscopy Anesthesia Type: MAC Level of consciousness: awake and alert Pain management: pain level controlled Vital Signs Assessment: post-procedure vital signs reviewed and stable Respiratory status: spontaneous breathing, nonlabored ventilation, respiratory function stable and patient connected to nasal cannula oxygen Cardiovascular status: stable and blood pressure returned to baseline Anesthetic complications: no       Last Vitals:  Vitals:   10/08/16 1630 10/08/16 1640  BP: (!) 89/38 (!) 99/59  Pulse: 93 96  Resp: (!) 23 (!) 26  Temp:      Last Pain:  Vitals:   10/08/16 1624  TempSrc: Oral  PainSc:                  Catalina Gravel

## 2016-10-08 NOTE — Interval H&P Note (Signed)
History and Physical Interval Note: Patient for upper endoscopy and flexible sigmoidoscopy today to evaluate bright red blood per rectum. He has become more anemic and received 1 additional unit of packed red cells about 30 minutes ago. Hemoglobin this morning 7.2 which did not increase after yesterday's transfusion He also received FFP for his considerable coagulopathy; INR declined from 3.6 to 3.06 Mental status continues to improve He had last EGD with banding of esophageal varices on 09/21/2016 which was 17 days ago We discussed that if varices are present now even without stigmata given prior variceal hemorrhage we would plan to repeat band ligation today HIGHER THAN BASELINE RISK.The nature of the procedure, as well as the risks, benefits, and alternatives were carefully and thoroughly reviewed with the patient. Ample time for discussion and questions allowed. The patient understood, was satisfied, and agreed to proceed.      10/08/2016 3:16 PM  Ray Hayden  has presented today for surgery, with the diagnosis of cirrhosis; bleeding  The various methods of treatment have been discussed with the patient and family. After consideration of risks, benefits and other options for treatment, the patient has consented to  Procedure(s): ESOPHAGOGASTRODUODENOSCOPY (EGD) (N/A) FLEXIBLE SIGMOIDOSCOPY (N/A) as a surgical intervention .  The patient's history has been reviewed, patient examined, no change in status, stable for surgery.  I have reviewed the patient's chart and labs.  Questions were answered to the patient's satisfaction.     Mikale Silversmith M

## 2016-10-08 NOTE — Op Note (Signed)
[facility] Patient Name: Ray Hayden Procedure Date : 10/08/2016 MRN: [id number] Attending MD: Beverley Fiedler , MD Date of Birth: [birth year] CSN: [id number] Age: 51 Admit Type: Inpatient Procedure:                Upper GI endoscopy Indications:              Recurrent hematochezia, Recent gastrointestinal                            bleeding, Decompensated cirrhosis with esophageal                            variceal hemorrhage treated with EVL x 4 on                            09/21/2016 Providers:                Carie Caddy. Rhea Belton, MD, Dwain Sarna, RN, Kandice Robinsons, Technician Referring MD:             [facility] Medicines:                Monitored Anesthesia Care Complications:            No immediate complications. Estimated Blood Loss:     Estimated blood loss was minimal. Procedure:                Pre-Anesthesia Assessment:                           - Prior to the procedure, a History and Physical                            was performed, and patient medications and                            allergies were reviewed. The patient's tolerance of                            previous anesthesia was also reviewed. The risks                            and benefits of the procedure and the sedation                            options and risks were discussed with the patient.                            All questions were answered, and informed consent                            was obtained. Prior Anticoagulants: The patient has                            taken  no previous anticoagulant or antiplatelet                            agents. ASA Grade Assessment: IV - A patient with                            severe systemic disease that is a constant threat                            to life. After reviewing the risks and benefits,                            the patient was deemed in satisfactory condition to                            undergo  the procedure.                           After obtaining informed consent, the endoscope was                            passed under direct vision. Throughout the                            procedure, the patient's blood pressure, pulse, and                            oxygen saturations were monitored continuously. The                            [id number]-[id number] ([id number] scope was introduced through the                            mouth, and advanced to the second part of duodenum.                            The upper GI endoscopy was accomplished without                            difficulty. The patient tolerated the procedure                            well. Scope In: Scope Out: Findings:      One short column of non-bleeding grade II varices were found in the       distal esophagus, 39 cm from the incisors. Stigmata of recent bleeding       were evident and red wale signs were present. Scarring from prior       treatment was also visible in the distal esophagus. Evidence of partial       eradication was visible. One band was successfully placed on the       residual varix with red wale sign with complete eradication, resulting       in deflation of varices.      Mild portal hypertensive gastropathy was  found in the cardia, in the       gastric fundus and in the gastric body.      There is no endoscopic evidence of varices in the stomach.      The examined duodenum was normal. Impression:               - Likely recently bleeding grade II esophageal                            varices. Completely eradicated. Banded.                           - Mild portal hypertensive gastropathy.                           - Normal examined duodenum.                           - No specimens collected. Moderate Sedation:      N/A Recommendation:           - Return patient to hospital ward for ongoing care.                           - Clear liquid diet. Advance as tolerated.                           -  Continue present medications.                           - 72 hours of octreotide infusion.                           - Continue lactulose in treatment of hepatic                            encephalopathy                           - Continue antibiotics for at least 5 days given                            recurrent bleeding in cirrhosis with ascites                           - Monitor Hgb closely. Transfuse as needed to                            maintain Hgb of 7.0 If rebleeding will need                            additional FFP and consideration of TIPS.                           - See flexible sigmoidoscopy report. Procedure Code(s):        --- Professional ---  43244, Esophagogastroduodenoscopy, flexible,                            transoral; with band ligation of esophageal/gastric                            varices Diagnosis Code(s):        --- Professional ---                           I85.01, Esophageal varices with bleeding                           K76.6, Portal hypertension                           K31.89, Other diseases of stomach and duodenum                           K92.1, Melena (includes Hematochezia)                           K92.2, Gastrointestinal hemorrhage, unspecified CPT copyright 2016 [employer]. All rights reserved. The codes documented in this report are preliminary and upon coder review may  be revised to meet current compliance requirements. Beverley Fiedler, MD 10/08/2016 4:22:30 PM This report has been signed electronically. Number of Addenda: 0

## 2016-10-08 NOTE — H&P (View-Only) (Signed)
Pt's RN has informed GI that pt has passed bloody stool x 2 this afternoon, looks/sounds like hematochezia.  Repeat CBC drawn just before 4 PM.   Added flex sig to the planned EGD for 11:15 tomorrow.  Tried to call pt's room to speak with and update him but no answer.  Eldonna Neuenfeldt PA-C 

## 2016-10-08 NOTE — Plan of Care (Signed)
Problem: Skin Integrity: Goal: Risk for impaired skin integrity will decrease Outcome: Not Progressing Secondary to MSAD from watery stools

## 2016-10-08 NOTE — Progress Notes (Signed)
PROGRESS NOTE    Ray Hayden  ZOX:[id number] DOB: [birth year] DOA: 09/21/2016 PCP: Marcelo Baldy, MD    Brief Narrative: 51 year old alcoholic male with cirrhosis who presents with UGI bleed due to varices that was banded.  Patient was intubated for alcohol withdrawal and EGD. He was extubated on 4/24 and precedex discontinued on 4/25. He was transferred to [facility] on 4/26. Since transfer to [facility], patient has been in out of confusion and encephalopathic. He has completed total of 9 days of IV antibiotics for possible aspiration pneumonia. In view of multiple admissions, non compliant to medications, alcohol abuse, palliative care consulted for goals of care. GI has been following the patient for upper GI bleed, ascites and encephalopathy. On 5/4, developed recurrent multiple bloody stools, acute blood loss anemia, remained hemodynamically stable, transferred to stepdown unit, transfused PRBCs and FFP with plans for EGD and flexible sigmoidoscopy on 10/08/16  Assessment & Plan:   Active Problems:   GI bleed   LFTs abnormal   Encephalopathy, hepatic (HCC)   Liver disease, chronic, with cirrhosis (HCC)   Respiratory failure (HCC)   Abdominal distension   Goals of care, counseling/discussion   Palliative care encounter   Ileus [facility])   History of esophageal varices with bleeding   Rectal bleeding   Variceal Upper GI bleed in cirrhotic with previous hx GI bleeds,  EGDs and banding of esophageal varices dating 12/2015 - 07/2016 at [facility].  EGD 4/18: grade 3 esophageal varices with active bleeding. Banded x 4.  On Protonix 40 MG daily. No improvement of coagulopathy with vitamin k.  On 5/4, developed recurrent multiple bloody stools (dark and red blood), acute blood loss anemia, remained hemodynamically stable, transferred to stepdown unit, transfused PRBCs and FFP with plans for EGD and flexible sigmoidoscopy on 10/08/16 As per report, had 2 BMs this morning consisting of dark and red  blood and clots. GI aware and plan procedures today.  Anemia/ABLA - Stable. However in the context of newly reported GI bleeding, follow CBCs closely and transfuse if hemoglobin less than or equal to 7 g per DL. - Status post PRBCs 5 units thus far this admission. - Received PRBCs 2 units and FFP 2 units overnight 5/4. Hemoglobin this morning without significant change at 7.2. GI has ordered additional unit of PRBCs. Follow posttransfusion CBCs.  Alcoholic liver disease with cirrhosis and ascites, coagulopathy;  US paracentesis by IR on 4/26, only 2.1 liters removed. Course complicated by small bowel ileus mostly contributing to significant abdominal distention rather than small volume ascites by ultrasound-hence I am unable to safely attempt diagnostic paracentesis.   Continue rifaximin. Due to hypotension and minimal ascites, GI has reduced Aldactone to 100 MG daily but continue Lasix 40 daily. Also reduce lactulose to 20 MG 3 times a day and switched thiamine to by mouth.  Remains confused .  Remains on empiric Zosyn for presumed SBP.  Acute hepatic encephalopathy:  Continue rifaximin and lactulose. Treating for possible SBP.  CT head > no acute abnormalities noted. MRI brain without acute findings or findings suggestive of Wernicke's encephalopathy. 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 Improving. Lactulose dose reduced.  Partial SBO/ileus:  Discussion as above. Now having multiple BMs and passing flatus. Improving.  ETOH dependence, with ETOH hepatitis and shock: improving.  Social worker consulted. PLAN FOR SNF on discharge.   Acute respiratory failure possibly from aspiration pneumonia.  Hypoxia resolved. Back on antibiotics for suspected pneumonia  Hypokalemia/hypomagnesemia:  Replaced. Follow periodically.  Leukocytosis:  ? Aspiration, get procalcitonin levels > elevated 0.31>0.27.  Afebrile. Monitor.  Chest x-ray 10/05/16  showed improved left basilar subsegmental atelectasis or infiltrate. Right infrahilar atelectasis or infiltrate. Urine microscopy results appreciated, although turbid, no bacteria seen and only 0-5 WBCs. RBCs probably traumatic from Foley and due to coagulopathy. Non on Zosyn. Better. Follow.  Adult failure to thrive inview of multiple co morbidities, multiple admissions, worsening liver issues, Dr. Blake Divine discussed with the brothers and called palliative care consult for goals of care .  Palliative care input appreciated. Discussed with them. They had recommended repeat paracentesis which is as discussed above. Morphine was changed to fentanyl when necessary to try and minimize opioids in system. For now, full scope of treatment. Overall prognosis poor.  Penile pain/microscopic hematuria - No acute findings except penile edema. Suspect catheter is causing his pain. Urine appearance mostly suggestive of bile rather than frank hematuria. Discontinued Foley catheter on 5/4. Pain resolved.  Sinus tachycardia, mild Multifactorial related to multiple acute medical conditions, deconditioning. Asymptomatic.  Debilitation/deconditioning - SNF for discharge.  Hyponatremia - Possibly due to hypervolemia/anasarca related to liver disease. Stable  New thrombocytopenia - ? Related to acute blood loss. Follow CBCs.    DVT prophylaxis: SCD'S Code Status: full code.  Family Communication: None at bedside Disposition Plan: SNF on discharge. Transferred to stepdown unit on 5/4.   Consultants:   Gastroenterology  PCCM.   Neurology   Procedures:  EGD on 4/18  Paracentesis  Foley catheter-discontinued on 5/4   Antimicrobials:   zosyn completed 5 days, rocephin and zithromax. 4 days.   Now on IV Zosyn.   Subjective: Unable to obtain history from patient or from nursing regarding BMs overnight. However had to bloody BMs since this morning. No chest pain, dyspnea, dizziness,  lightheadedness. Complains that he is hungry. Indicates that his abdominal distention and discomfort have significantly improved.  Objective: Vitals:   10/08/16 1000 10/08/16 1100 10/08/16 1109 10/08/16 1204  BP: 104/65 114/70 115/64 104/62  Pulse: (!) 108 (!) 110  (!) 101  Resp: (!) 30 (!) 34 (!) 22 (!) 31  Temp:    98.5 F (36.9 C)  TempSrc:    Oral  SpO2: 94% 94% 96% 94%  Weight:      Height:        Intake/Output Summary (Last 24 hours) at 10/08/16 1216 Last data filed at 10/08/16 0600  Gross per 24 hour  Intake             1878 ml  Output                0 ml  Net             1878 ml   Filed Weights   10/07/16 0416 10/07/16 1830 10/08/16 0255  Weight: 106.8 kg (235 lb 7.2 oz) 104.7 kg (230 lb 13.2 oz) 106.7 kg (235 lb 3.7 oz)    Examination:  General exam: Pleasant middle-aged male, moderately built and poorly nourished, chronically ill looking, lying comfortably propped up in bed. Scleral icterus. Respiratory system: Diminished breath sounds in the bases with occasional basal crackles but otherwise clear to auscultation. No increased work of breathing. Cardiovascular system: S1 & S2 heard, RRR.  2 +  pedal edema/anasarca/edema extending to external genitalia. Telemetry: Sinus tachycardia in the 110's - 120's Gastrointestinal system: Abdomen definitely less distended, soft, not tense, nontender. Tympanitic centrally. Penoscrotal edema. No acute findings. Improved bowel sounds heard. Central nervous  system: Alert and oriented 2. No focal neurological deficits. Asterixis +. Skin: No rashes, lesions or ulcers Psychiatry: Pleasant and answers questions appropriately.    Data Reviewed: I have personally reviewed following labs and imaging studies  CBC:  Recent Labs Lab 10/05/16 1228 10/06/16 0340 10/07/16 0428 10/07/16 1544 10/08/16 0856  WBC 20.5* 17.7* 15.1* 16.7* 15.7*  HGB 9.0* 8.3* 8.3* 7.1* 7.2*  HCT 26.5* 24.8* 24.1* 21.1* 20.9*  MCV 94.6 93.6 94.9 95.0  91.7  PLT 178 178 154 160 120*   Basic Metabolic Panel:  Recent Labs Lab 10/01/16 1410 10/05/16 1228 10/06/16 0340 10/07/16 0428 10/08/16 0856  NA 138 130* 129* 129* 127*  K 4.1 3.4* 3.2* 4.1 4.2  CL 106 99* 97* 98* 99*  CO2 18* 19* 21* 20* 18*  GLUCOSE 113* 122* 102* 102* 113*  BUN 11 14 12 15  29*  CREATININE 0.84 0.84 0.67 0.72 0.96  CALCIUM 8.0* 7.8* 7.6* 7.4* 7.3*  MG  --   --   --  1.6* 1.8   GFR: Estimated Creatinine Clearance: 117.7 mL/min (by C-G formula based on SCr of 0.96 mg/dL). Liver Function Tests:  Recent Labs Lab 10/03/16 0447 10/05/16 1228 10/06/16 0340 10/07/16 0428  AST 85* 88* 73* 77*  ALT 72* 61 51 47  ALKPHOS 80 86 76 76  BILITOT 8.0* 9.4* 8.0* 7.5*  PROT 6.5 6.7 6.3* 6.6  ALBUMIN 2.5* 2.4* 2.2* 2.0*    Recent Labs Lab 10/04/16 0403 10/07/16 0428  AMMONIA 56* 42*   Coagulation Profile:  Recent Labs Lab 10/04/16 0403 10/07/16 0428 10/08/16 0856  INR 2.35 3.62 3.06   CBG:  Recent Labs Lab 10/03/16 0047 10/03/16 0433 10/03/16 0802 10/03/16 1131 10/03/16 1622  GLUCAP 108* 99 104* 123* 103*   Sepsis Labs:  Recent Labs Lab 10/04/16 0701 10/04/16 1247 10/05/16 1228 10/07/16 0428  PROCALCITON  --  0.31 0.27 0.37  LATICACIDVEN 2.4*  --   --   --     Recent Results (from the past 240 hour(s))  Culture, body fluid-bottle     Status: None   Collection Time: 09/29/16 12:14 PM  Result Value Ref Range Status   Specimen Description PERITONEAL  Final   Special Requests NONE  Final   Culture NO GROWTH 5 DAYS  Final   Report Status 10/04/2016 FINAL  Final  Gram stain     Status: None   Collection Time: 09/29/16 12:14 PM  Result Value Ref Range Status   Specimen Description PERITONEAL  Final   Special Requests NONE  Final   Gram Stain NO WBC SEEN NO ORGANISMS SEEN   Final   Report Status 09/29/2016 FINAL  Final         Radiology Studies: No results found.      Scheduled Meds: . feeding supplement  1  Container Oral TID BM  . feeding supplement (ENSURE ENLIVE)  237 mL Oral BID BM  . folic acid  1 mg Oral Daily  . furosemide  40 mg Oral Daily  . hydrocortisone  25 mg Rectal BID  . lactulose  20 g Oral TID  . mouth rinse  15 mL Mouth Rinse BID  . multivitamin with minerals  1 tablet Oral Daily  . nicotine  21 mg Transdermal Daily  . pantoprazole  40 mg Oral Daily  . rifaximin  550 mg Oral BID  . sodium chloride flush  3 mL Intravenous Q12H  . spironolactone  100 mg Oral Daily  . thiamine  100 mg Oral Daily   Continuous Infusions: . sodium chloride    . sodium chloride    . octreotide  (SANDOSTATIN)    IV infusion 50 mcg/hr (10/07/16 2122)  . piperacillin-tazobactam (ZOSYN)  IV 3.375 g (10/08/16 0719)     LOS: 17 days    Time spent: 35 minutes.    Marcellus ScottHONGALGI,ANAND, MD, FACP, FHM. [facility] Pager [phone]  If 7PM-7AM, please contact night-coverage [email or web address] Password [email or web address] 10/08/2016, 12:16 PM

## 2016-10-09 DIAGNOSIS — E871 Hypo-osmolality and hyponatremia: Secondary | ICD-10-CM

## 2016-10-09 LAB — TYPE AND SCREEN
ABO/RH(D): AB POS
ANTIBODY SCREEN: NEGATIVE
UNIT DIVISION: 0
UNIT DIVISION: 0
UNIT DIVISION: 0

## 2016-10-09 LAB — COMPREHENSIVE METABOLIC PANEL
ALT: 34 U/L (ref 17–63)
AST: 63 U/L — ABNORMAL HIGH (ref 15–41)
Albumin: 1.7 g/dL — ABNORMAL LOW (ref 3.5–5.0)
Alkaline Phosphatase: 58 U/L (ref 38–126)
Anion gap: 8 (ref 5–15)
BILIRUBIN TOTAL: 9.2 mg/dL — AB (ref 0.3–1.2)
BUN: 23 mg/dL — ABNORMAL HIGH (ref 6–20)
CHLORIDE: 100 mmol/L — AB (ref 101–111)
CO2: 20 mmol/L — ABNORMAL LOW (ref 22–32)
Calcium: 7.2 mg/dL — ABNORMAL LOW (ref 8.9–10.3)
Creatinine, Ser: 0.82 mg/dL (ref 0.61–1.24)
Glucose, Bld: 120 mg/dL — ABNORMAL HIGH (ref 65–99)
POTASSIUM: 4 mmol/L (ref 3.5–5.1)
Sodium: 128 mmol/L — ABNORMAL LOW (ref 135–145)
TOTAL PROTEIN: 5.3 g/dL — AB (ref 6.5–8.1)

## 2016-10-09 LAB — PREPARE FRESH FROZEN PLASMA
UNIT DIVISION: 0
Unit division: 0

## 2016-10-09 LAB — CBC
HCT: 20.9 % — ABNORMAL LOW (ref 39.0–52.0)
HEMATOCRIT: 23.2 % — AB (ref 39.0–52.0)
HEMOGLOBIN: 8 g/dL — AB (ref 13.0–17.0)
Hemoglobin: 7.2 g/dL — ABNORMAL LOW (ref 13.0–17.0)
MCH: 31 pg (ref 26.0–34.0)
MCH: 31.5 pg (ref 26.0–34.0)
MCHC: 34.4 g/dL (ref 30.0–36.0)
MCHC: 34.5 g/dL (ref 30.0–36.0)
MCV: 90.1 fL (ref 78.0–100.0)
MCV: 91.3 fL (ref 78.0–100.0)
PLATELETS: 103 10*3/uL — AB (ref 150–400)
Platelets: 108 10*3/uL — ABNORMAL LOW (ref 150–400)
RBC: 2.32 MIL/uL — ABNORMAL LOW (ref 4.22–5.81)
RBC: 2.54 MIL/uL — AB (ref 4.22–5.81)
RDW: 19.2 % — AB (ref 11.5–15.5)
RDW: 19.1 % — ABNORMAL HIGH (ref 11.5–15.5)
WBC: 13.9 10*3/uL — ABNORMAL HIGH (ref 4.0–10.5)
WBC: 14.7 10*3/uL — AB (ref 4.0–10.5)

## 2016-10-09 LAB — BPAM RBC
BLOOD PRODUCT EXPIRATION DATE: 201805202359
BLOOD PRODUCT EXPIRATION DATE: 201805222359
Blood Product Expiration Date: 201805102359
ISSUE DATE / TIME: 201805042145
ISSUE DATE / TIME: 201805051201
ISSUE DATE / TIME: 201805041825
UNIT TYPE AND RH: 8400
UNIT TYPE AND RH: 8400
Unit Type and Rh: 6200

## 2016-10-09 LAB — BPAM FFP
BLOOD PRODUCT EXPIRATION DATE: 201805092359
Blood Product Expiration Date: 201805092359
ISSUE DATE / TIME: 201805050206
ISSUE DATE / TIME: 201805050028
UNIT TYPE AND RH: 2800
UNIT TYPE AND RH: 8400

## 2016-10-09 LAB — PROCALCITONIN: Procalcitonin: 0.49 ng/mL

## 2016-10-09 MED ORDER — FUROSEMIDE 40 MG PO TABS
40.0000 mg | ORAL_TABLET | Freq: Once | ORAL | Status: AC
  Administered 2016-10-09: 40 mg via ORAL
  Filled 2016-10-09: qty 1

## 2016-10-09 MED ORDER — SPIRONOLACTONE 50 MG PO TABS
100.0000 mg | ORAL_TABLET | Freq: Once | ORAL | Status: AC
  Administered 2016-10-09: 100 mg via ORAL
  Filled 2016-10-09: qty 2

## 2016-10-09 MED ORDER — SPIRONOLACTONE 50 MG PO TABS
200.0000 mg | ORAL_TABLET | Freq: Every day | ORAL | Status: DC
  Administered 2016-10-10 – 2016-10-13 (×4): 200 mg via ORAL
  Filled 2016-10-09 (×4): qty 4

## 2016-10-09 MED ORDER — FUROSEMIDE 80 MG PO TABS
80.0000 mg | ORAL_TABLET | Freq: Every day | ORAL | Status: DC
  Administered 2016-10-10 – 2016-10-13 (×4): 80 mg via ORAL
  Filled 2016-10-09 (×4): qty 1

## 2016-10-09 NOTE — Progress Notes (Addendum)
Progress Note   Subjective  No new complaints today Patient wants to try to walk around and get out of bed more Denies abdominal pain Having bowel movements without significant melena or bleeding Hemoglobin down 1 g since yesterday Bladder scan revealed greater than 1 L however only 200 mL's with Foley.   Objective  Vital signs in last 24 hours: Temp:  [97.7 F (36.5 C)-98.9 F (37.2 C)] 98.7 F (37.1 C) (05/06 0725) Pulse Rate:  [90-110] 103 (05/06 0310) Resp:  [20-34] 21 (05/06 0310) BP: (85-117)/(38-79) 113/68 (05/06 0725) SpO2:  [93 %-99 %] 93 % (05/06 0725) Weight:  [240 lb 11.9 oz (109.2 kg)] 240 lb 11.9 oz (109.2 kg) (05/06 0310) Last BM Date: 10/08/16  Gen: awake, alert, NAD, jaundiced HEENT: icteric, op clear CV: Tachycardia but regular Pulm: Decreased bilateral bases Abd: soft, nontender, distended with tympany, +BS throughout Ext: no c/c, 2+ LE edema Neuro: nonfocal without asterixis   Intake/Output from previous day: 05/05 0701 - 05/06 0700 In: 1645.4 [I.V.:1215.4; Blood:380; IV Piggyback:50] Out: 860 [Urine:850; Blood:10] Intake/Output this shift: No intake/output data recorded.  Lab Results:  Recent Labs  10/08/16 0856 10/08/16 1803 10/09/16 0437  WBC 15.7* 14.4* 13.9*  HGB 7.2* 8.3* 7.2*  HCT 20.9* 23.7* 20.9*  PLT 120* 114* 103*   BMET  Recent Labs  10/07/16 0428 10/08/16 0856 10/09/16 0437  NA 129* 127* 128*  K 4.1 4.2 4.0  CL 98* 99* 100*  CO2 20* 18* 20*  GLUCOSE 102* 113* 120*  BUN 15 29* 23*  CREATININE 0.72 0.96 0.82  CALCIUM 7.4* 7.3* 7.2*   LFT  Recent Labs  10/09/16 0437  PROT 5.3*  ALBUMIN 1.7*  AST 63*  ALT 34  ALKPHOS 58  BILITOT 9.2*   PT/INR  Recent Labs  10/07/16 0428 10/08/16 0856  LABPROT 36.9* 32.3*  INR 3.62 3.06   Hepatitis Panel No results for input(s): HEPBSAG, HCVAB, HEPAIGM, HEPBIGM in the last 72 hours.  Studies/Results: No results found.    Assessment & Plan  51 year old  male with decompensated alcoholic liver disease/cirrhosis with now prolonged hospitalization with variceal hemorrhage, multifactorial encephalopathy including hepatic encephalopathy, small bowel ileus, ascites without SBP, coagulopathy, hyponatremia related to cirrhosis and hypotension related to cirrhosis. Some recent rectal bleeding  1. Encephalopathy -- multifactorial with certain component of hepatic encephalopathy. Continue lactulose and rifaximin. Encephalopathy improved significantly over the course of last week with even further improvement when empiric antibiotics were added 3 days ago.  2. GI bleeding -- repeat EGD and flexible sigmoidoscopy yesterday. He was having some red blood per rectum. Blood was present though in scant amounts to the mid transverse colon at flexible sigmoidoscopy. Upper endoscopy with one varix with red well sign without active bleeding. Additional band ligation performed yesterday. Perhaps this was the culprit for bleeding and drop in hemoglobin. He does have internal hemorrhoids which are significant but were not bleeding at the time of flexible sigmoidoscopy --Hemoglobin 7.2. Repeat in 12 hours and if below 7 repeat transfusion. Goal hemoglobin around 7 given varices --Continue octreotide drip for 72 hours from banding --EGD would be in about 4 weeks for banding protocol --If further bleeding FFP would be needed to further correct coagulopathy --He remains on antibiotics which we will continue for an additional 2 days, at least depending on condition  3. Leukocytosis -- no clear source of infection. He has very minimal ascites and so SBP unlikely however white count and mental status have improved dramatically  with empiric antibiotics over the last 3 days. Discussed with Dr. Waymon Amato and will target 5 days for now.  4. Ileus -- small bowel without clinical evidence of obstruction. He is having bowel movements and passing gas. Tolerating by mouth diet and asking to  advance --Advance diet to soft, low sodium  5. Malnutrition/deconditioning -- Leota Sauers physical therapy I expect he will need rehabilitation  6. Ascites/LE edema -- ascites is minimal lower extremity edema is considerable. Hyponatremia in the setting of cirrhosis with stable renal function. --Increase Lasix to 80 mg daily and spironolactone to 200 mg daily. Monitor renal function and sodium closely. If renal function worsens or if hyponatremia worsens we'll need to decrease diuretics       Active Problems:   GI bleed   LFTs abnormal   Encephalopathy, hepatic (HCC)   Liver disease, chronic, with cirrhosis (HCC)   Respiratory failure (HCC)   Abdominal distension   Goals of care, counseling/discussion   Palliative care encounter   Ileus (HCC)   History of esophageal varices with bleeding   Rectal bleeding     LOS: 18 days   Dezmond Downie M  10/09/2016, 10:54 AM

## 2016-10-09 NOTE — Progress Notes (Signed)
PROGRESS NOTE    Ray Hayden  ZOX:[id number] DOB: 1966-03-23 DOA: 09/21/2016 PCP: Marcelo Baldy, MD    Brief Narrative: 51 year old alcoholic male with cirrhosis who presents with UGI bleed due to varices that was banded.  Patient was intubated for alcohol withdrawal and EGD. He was extubated on 4/24 and precedex discontinued on 4/25. He was transferred to [facility] on 4/26. Since transfer to [facility], patient has been in out of confusion and encephalopathic. He has completed total of 9 days of IV antibiotics for possible aspiration pneumonia. In view of multiple admissions, non compliant to medications, alcohol abuse, palliative care consulted for goals of care. GI has been following the patient for upper GI bleed, ascites and encephalopathy. On 5/4, developed recurrent multiple bloody stools, acute blood loss anemia, remained hemodynamically stable, transferred to stepdown unit, transfused PRBCs and FFP. S/P EGD with variceal banding and flexible sigmoidoscopy on 10/08/16. GI bleed seems to have abated.  Assessment & Plan:   Active Problems:   GI bleed   LFTs abnormal   Encephalopathy, hepatic (HCC)   Liver disease, chronic, with cirrhosis (HCC)   Respiratory failure (HCC)   Abdominal distension   Goals of care, counseling/discussion   Palliative care encounter   [facility])   History of esophageal varices with bleeding   Rectal bleeding   Variceal Upper GI bleed in cirrhotic with previous hx GI bleeds,  EGDs and banding of esophageal varices dating 12/2015 - 07/2016 at [facility].  EGD 4/18: grade 3 esophageal varices with active bleeding. Banded x 4.  On Protonix 40 MG daily. No improvement of coagulopathy with vitamin k.  On 5/4, developed recurrent multiple bloody stools (dark and red blood), acute blood loss anemia, remained hemodynamically stable, transferred to stepdown unit, transfused PRBCs and FFP.  - S/P EGD 10/08/16 with one varix with red well sign without acute bleeding.  Additional band ligation performed 5/5 in this lesion was felt to be the culprit for bleeding. S/P flexible sigmoidoscopy on 10/08/16 >internal hemorrhoids, significant but not bleeding. - Bleeding seems to have abated. GI follow-up appreciated. Follow CBC and transfuse 2 goal hemoglobin of 7 given varices, continue octreotide drip 72 hours from banding, repeat EGD in about 4 weeks for banding protocol and completed 2 additional days of antibiotics.  Anemia/ABLA - Stable. However in the context of newly reported GI bleeding, follow CBCs closely and transfuse if hemoglobin less than or equal to 7 g per DL. - Status post PRBCs 8 units thus far this admission. - Last PRBC transfusion on 5/5. Hemoglobin improved to 8.3 but dropped this morning to 7.2. As discussed with GI, follow-up CBC every 12 hours and transfuse 2 goal of approximately 7 g per DL and avoid over transfusion due to varices.  Alcoholic liver disease with cirrhosis and ascites, coagulopathy, anasarca;  US paracentesis by IR on 4/26, only 2.1 liters removed. Course complicated by small bowel ileus mostly contributing to significant abdominal distention rather than small volume ascites by ultrasound-hence IR unable to safely attempt diagnostic paracentesis.   Continue rifaximin, lactulose to 20 MG 3 times a day and switched thiamine to by mouth.  Mental status continues to improve. Remains on empiric Zosyn for presumed SBP-felt less likely.. Complete total of 5 days of antibiotics on 10/09/16 then discontinue. Diuretics increased again to Lasix 80 mg daily and Aldactone 200 MG daily. Monitor renal functions closely.  Acute hepatic encephalopathy:  Continue rifaximin and lactulose. Treating for possible SBP.  CT head >  no acute abnormalities noted. MRI brain without acute findings or findings suggestive of Wernicke's encephalopathy. 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 Improving. Lactulose  dose reduced. Improving  Partial SBO/ileus:  Discussion as above. Now having multiple BMs and passing flatus. Improving. Advancing diet to soft diet.  ETOH dependence, with ETOH hepatitis and shock: improving.  Social worker consulted. PLAN FOR SNF on discharge.   Acute respiratory failure possibly from aspiration pneumonia.  Hypoxia resolved. Back on antibiotics for suspected pneumonia  Hypokalemia/hypomagnesemia:  Replaced. Follow periodically.  Leukocytosis:  ? Aspiration, get procalcitonin levels > elevated 0.31>0.27.  Afebrile. Monitor.  Chest x-ray 10/05/16 showed improved left basilar subsegmental atelectasis or infiltrate. Right infrahilar atelectasis or infiltrate. Urine microscopy results appreciated, although turbid, no bacteria seen and only 0-5 WBCs. RBCs probably traumatic from Foley and due to coagulopathy. Non on Zosyn. Continues to improve. DC Zosyn after 10/10/15 dose.  Adult failure to thrive inview of multiple co morbidities, multiple admissions, worsening liver issues, Dr. Blake Divine discussed with the brothers and called palliative care consult for goals of care .  Palliative care input appreciated. Discussed with them. They had recommended repeat paracentesis which is as discussed above. Morphine was changed to fentanyl when necessary to try and minimize opioids in system. For now, full scope of treatment. Overall prognosis poor.  Penile pain/microscopic hematuria - No acute findings except penile edema. Suspect catheter is causing his pain. Urine appearance mostly suggestive of bile rather than frank hematuria. Discontinued Foley catheter on 5/4. On 5/5, some difficulty in urinating, bladder scan apparently showed >999 ML but when catheter was placed back only 200 mL obtained. Bladder scan might have picked up ascitic fluid. DC Foley catheter again on 5/6 and monitor for voiding.  Sinus tachycardia, mild Multifactorial related to multiple acute medical conditions,  deconditioning. Asymptomatic.  Debilitation/deconditioning - SNF for discharge.  Hyponatremia - Possibly due to hypervolemia/anasarca related to liver disease. Stable  New thrombocytopenia - ? Related to acute blood loss. Follow CBCs.    DVT prophylaxis: SCD'S Code Status: full code.  Family Communication: None at bedside Disposition Plan: SNF on discharge. Transferred to stepdown unit on 5/4.   Consultants:   Gastroenterology  PCCM.   Neurology   Procedures:  EGD on 4/18  Paracentesis  Foley catheter-discontinued on 5/4  Flexible sigmoidoscopy 10/08/16: Impression:               - Fungal rash found on perianal exam.                           - A few diminutive polyps in the rectum and at the                            recto-sigmoid colon. Resection not attempted.                           - Edematous mucosa in the sigmoid colon, in the                            descending colon and in the transverse colon due to                            portal hypertension and hypoalbuminemia. no  evidence of colitis                           - Scant and scattered, old/clotted blood in the                            sigmoid colon, in the descending colon and in the                            transverse colon. Likely from a proximal GI source,                            with current knowledge most likely from                            intermittent esophageal variceal hemorrhage (see                            EGD report).                           - Non-bleeding internal hemorrhoids.                           - No specimens collected.  EGD 10/08/16: Impression:               - Likely recently bleeding grade II esophageal                            varices. Completely eradicated. Banded.                           - Mild portal hypertensive gastropathy.                           - Normal examined duodenum.                           - No specimens  collected.   Antimicrobials:   zosyn completed 5 days, rocephin and zithromax. 4 days.   Now on IV Zosyn.   Subjective: Overall feels better. Had couple of BMs overnight with either minimal or no evidence of blood. Tolerating diet.  Objective: Vitals:   10/08/16 2314 10/09/16 0310 10/09/16 0725 10/09/16 1223  BP: 117/79 109/64 113/68   Pulse: (!) 108 (!) 103    Resp: (!) 32 (!) 21    Temp: 98.5 F (36.9 C) 98.2 F (36.8 C) 98.7 F (37.1 C) 98.9 F (37.2 C)  TempSrc: Oral Oral Oral Oral  SpO2: 96% 94% 93%   Weight:  109.2 kg (240 lb 11.9 oz)    Height:        Intake/Output Summary (Last 24 hours) at 10/09/16 1249 Last data filed at 10/09/16 [id number]  Gross per 24 hour  Intake          1645.42 ml  Output              860 ml  Net           785.42 ml  Filed Weights   10/07/16 1830 10/08/16 0255 10/09/16 0310  Weight: 104.7 kg (230 lb 13.2 oz) 106.7 kg (235 lb 3.7 oz) 109.2 kg (240 lb 11.9 oz)    Examination:  General exam: Pleasant middle-aged male, moderately built and poorly nourished, chronically ill looking, lying comfortably propped up in bed. Scleral icterus. Respiratory system: Diminished breath sounds in the bases but otherwise clear to auscultation. No increased work of breathing. Cardiovascular system: S1 & S2 heard, RRR.  2 +  pedal edema/anasarca/edema extending to external genitalia. Telemetry: Sinus tachycardia in the 100-110's Gastrointestinal system: Abdomen definitely less distended, soft, not tense, nontender. Tympanitic centrally. Penoscrotal edema. No acute findings. Improved bowel sounds heard. Central nervous system: Alert and oriented 2. No focal neurological deficits. Asterixis +. Skin: No rashes, lesions or ulcers Psychiatry: Pleasant and answers questions appropriately.    Data Reviewed: I have personally reviewed following labs and imaging studies  CBC:  Recent Labs Lab 10/07/16 0428 10/07/16 1544 10/08/16 0856 10/08/16 1803  10/09/16 0437  WBC 15.1* 16.7* 15.7* 14.4* 13.9*  HGB 8.3* 7.1* 7.2* 8.3* 7.2*  HCT 24.1* 21.1* 20.9* 23.7* 20.9*  MCV 94.9 95.0 91.7 91.2 90.1  PLT 154 160 120* 114* 103*   Basic Metabolic Panel:  Recent Labs Lab 10/05/16 1228 10/06/16 0340 10/07/16 0428 10/08/16 0856 10/09/16 0437  NA 130* 129* 129* 127* 128*  K 3.4* 3.2* 4.1 4.2 4.0  CL 99* 97* 98* 99* 100*  CO2 19* 21* 20* 18* 20*  GLUCOSE 122* 102* 102* 113* 120*  BUN 14 12 15  29* 23*  CREATININE 0.84 0.67 0.72 0.96 0.82  CALCIUM 7.8* 7.6* 7.4* 7.3* 7.2*  MG  --   --  1.6* 1.8  --    GFR: Estimated Creatinine Clearance: 137.8 mL/min (by C-G formula based on SCr of 0.82 mg/dL). Liver Function Tests:  Recent Labs Lab 10/03/16 0447 10/05/16 1228 10/06/16 0340 10/07/16 0428 10/09/16 0437  AST 85* 88* 73* 77* 63*  ALT 72* 61 51 47 34  ALKPHOS 80 86 76 76 58  BILITOT 8.0* 9.4* 8.0* 7.5* 9.2*  PROT 6.5 6.7 6.3* 6.6 5.3*  ALBUMIN 2.5* 2.4* 2.2* 2.0* 1.7*    Recent Labs Lab 10/04/16 0403 10/07/16 0428  AMMONIA 56* 42*   Coagulation Profile:  Recent Labs Lab 10/04/16 0403 10/07/16 0428 10/08/16 0856  INR 2.35 3.62 3.06   CBG:  Recent Labs Lab 10/03/16 0047 10/03/16 0433 10/03/16 0802 10/03/16 1131 10/03/16 1622  GLUCAP 108* 99 104* 123* 103*   Sepsis Labs:  Recent Labs Lab 10/04/16 0701 10/04/16 1247 10/05/16 1228 10/07/16 0428 10/09/16 0437  PROCALCITON  --  0.31 0.27 0.37 0.49  LATICACIDVEN 2.4*  --   --   --   --     No results found for this or any previous visit (from the past 240 hour(s)).       Radiology Studies: No results found.      Scheduled Meds: . feeding supplement  1 Container Oral TID BM  . feeding supplement (ENSURE ENLIVE)  237 mL Oral BID BM  . folic acid  1 mg Oral Daily  . [START ON 10/10/2016] furosemide  80 mg Oral Daily  . hydrocortisone  25 mg Rectal BID  . lactulose  20 g Oral TID  . mouth rinse  15 mL Mouth Rinse BID  . multivitamin with  minerals  1 tablet Oral Daily  . nicotine  21 mg Transdermal Daily  . nystatin ointment   Topical  TID  . pantoprazole  40 mg Oral Daily  . rifaximin  550 mg Oral BID  . sodium chloride flush  3 mL Intravenous Q12H  . [START ON 10/10/2016] spironolactone  200 mg Oral Daily  . thiamine  100 mg Oral Daily   Continuous Infusions: . sodium chloride    . octreotide  (SANDOSTATIN)    IV infusion 50 mcg/hr (10/09/16 1610)  . piperacillin-tazobactam (ZOSYN)  IV Stopped (10/09/16 1037)     LOS: 18 days    Time spent: 35 minutes.    Marcellus Scott, MD, FACP, FHM. [facility] Pager [phone]  If 7PM-7AM, please contact night-coverage [email or web address] Password [email or web address] 10/09/2016, 12:49 PM

## 2016-10-10 ENCOUNTER — Encounter (HOSPITAL_COMMUNITY): Payer: Self-pay | Admitting: Internal Medicine

## 2016-10-10 DIAGNOSIS — G9340 Encephalopathy, unspecified: Secondary | ICD-10-CM

## 2016-10-10 LAB — BASIC METABOLIC PANEL
Anion gap: 5 (ref 5–15)
BUN: 14 mg/dL (ref 6–20)
CALCIUM: 7.3 mg/dL — AB (ref 8.9–10.3)
CO2: 20 mmol/L — ABNORMAL LOW (ref 22–32)
CREATININE: 0.73 mg/dL (ref 0.61–1.24)
Chloride: 103 mmol/L (ref 101–111)
GFR calc Af Amer: >60 mL/min (ref >60)
GLUCOSE: 111 mg/dL — AB (ref 65–99)
Potassium: 3.7 mmol/L (ref 3.5–5.1)
Sodium: 128 mmol/L — ABNORMAL LOW (ref 135–145)

## 2016-10-10 LAB — CBC
HCT: 22.2 % — ABNORMAL LOW (ref 39.0–52.0)
Hemoglobin: 7.6 g/dL — ABNORMAL LOW (ref 13.0–17.0)
MCH: 31.5 pg (ref 26.0–34.0)
MCHC: 34.2 g/dL (ref 30.0–36.0)
MCV: 92.1 fL (ref 78.0–100.0)
PLATELETS: 104 10*3/uL — AB (ref 150–400)
RBC: 2.41 MIL/uL — ABNORMAL LOW (ref 4.22–5.81)
RDW: 19.4 % — AB (ref 11.5–15.5)
WBC: 13.2 10*3/uL — ABNORMAL HIGH (ref 4.0–10.5)

## 2016-10-10 NOTE — Progress Notes (Signed)
Physical Therapy Treatment Patient Details Name: Ray Hayden MRN: [id number] DOB: 06-Sep-1965 Today's Date: 10/10/2016    History of Present Illness 51 year old alcoholic male adm 09/21/2016 with UGI bleed due to varices that was banded. Patient vomiting blood & intubated for airway protection in setting of ETOH withdrawal; He was extubated on 4/24; PMHx: cirrhosis, ETOH, smoker, HTN    PT Comments    Good progress with ambulation today. Pt able to ambulate 150 feet with RW and min assist. +2 assist utilized for chair follow.   Follow Up Recommendations  SNF     Equipment Recommendations  None recommended by PT    Recommendations for Other Services       Precautions / Restrictions Precautions Precautions: Fall Restrictions Weight Bearing Restrictions: No    Mobility  Bed Mobility         Supine to sit: Min assist     General bed mobility comments: verbal cues for sequencing  Transfers   Equipment used: Rolling walker (2 wheeled)   Sit to Stand: +2 safety/equipment;Min assist         General transfer comment: verbal cues for hand placement  Ambulation/Gait Ambulation/Gait assistance: Min assist;+2 safety/equipment Ambulation Distance (Feet): 150 Feet Assistive device: Rolling walker (2 wheeled) Gait Pattern/deviations: Step-through pattern;Decreased stride length Gait velocity: Decreased Gait velocity interpretation: Below normal speed for age/gender General Gait Details: heavy reliance on RW, HR 128 max SpO2 95% on RA; chair follow for safety with return to room in recliner. Pt able to have fluid conversation throughout gait distance.   Stairs            Wheelchair Mobility    Modified Rankin (Stroke Patients Only)       Balance   Sitting-balance support: No upper extremity supported;Feet supported Sitting balance-Leahy Scale: Good     Standing balance support: Bilateral upper extremity supported;During functional activity Standing  balance-Leahy Scale: Poor Standing balance comment: Reliant on RW for stability                             Cognition Arousal/Alertness: Awake/alert Behavior During Therapy: WFL for tasks assessed/performed Overall Cognitive Status: Within Functional Limits for tasks assessed                                        Exercises      General Comments        Pertinent Vitals/Pain Pain Assessment: No/denies pain    Home Living                      Prior Function            PT Goals (current goals can now be found in the care plan section) Acute Rehab PT Goals Patient Stated Goal: per pt family--rehab at SNF PT Goal Formulation: With family Time For Goal Achievement: 10/14/16 Potential to Achieve Goals: Fair Progress towards PT goals: Progressing toward goals    Frequency    Min 3X/week      PT Plan Current plan remains appropriate    Co-evaluation              AM-PAC PT "6 Clicks" Daily Activity  Outcome Measure  Difficulty turning over in bed (including adjusting bedclothes, sheets and blankets)?: A Little Difficulty moving from lying on back to sitting on the  side of the bed? : Total Difficulty sitting down on and standing up from a chair with arms (e.g., wheelchair, bedside commode, etc,.)?: Total Help needed moving to and from a bed to chair (including a wheelchair)?: A Little Help needed walking in hospital room?: A Little Help needed climbing 3-5 steps with a railing? : A Lot 6 Click Score: 13    End of Session Equipment Utilized During Treatment: Gait belt Activity Tolerance: Patient tolerated treatment well Patient left: in chair;with call bell/phone within reach Nurse Communication: Mobility status PT Visit Diagnosis: Muscle weakness (generalized) (M62.81);Unsteadiness on feet (R26.81);Other abnormalities of gait and mobility (R26.89)     Time: [id number] PT Time Calculation (min) (ACUTE ONLY): 35  min  Charges:  $Gait Training: 23-37 mins                    G Codes:       Ray Hayden, PT  Office # [phone] Pager [phone]    Ray Hayden 10/10/2016, 10:58 AM

## 2016-10-10 NOTE — Progress Notes (Signed)
PROGRESS NOTE    Ray Hayden  WJX:[id number] DOB: June 03, 1966 DOA: 09/21/2016 PCP: Marcelo Baldy, MD    Brief Narrative: 51 year old alcoholic male with cirrhosis who presents with UGI bleed due to varices that was banded.  Patient was intubated for alcohol withdrawal and EGD. He was extubated on 4/24 and precedex discontinued on 4/25. He was transferred to [facility] on 4/26. Since transfer to [facility], patient had been in out of confusion and encephalopathic. In view of multiple admissions, non compliant to medications, alcohol abuse, palliative care consulted for goals of care. GI has been following the patient for upper GI bleed, ascites and encephalopathy. On 5/4, developed recurrent multiple bloody stools, acute blood loss anemia, remained hemodynamically stable, transferred to stepdown unit, transfused PRBCs and FFP. S/P EGD with variceal banding and flexible sigmoidoscopy on 10/08/16. GI bleed seems to have abated.  Assessment & Plan:   Active Problems:   GI bleed   LFTs abnormal   Encephalopathy, hepatic (HCC)   Liver disease, chronic, with cirrhosis (HCC)   Respiratory failure (HCC)   Abdominal distension   Goals of care, counseling/discussion   Palliative care encounter   [facility])   History of esophageal varices with bleeding   Rectal bleeding   Variceal Upper GI bleed in cirrhotic with previous hx GI bleeds,  Previous outside hospital, EGDs and banding of esophageal varices dating 12/2015 - 07/2016 at [facility].  EGD 4/18 (#1): grade 3 bleeding esophageal varices. Banded x 4.  On 5/4, developed recurrent hematochezia and melena, acute blood loss anemia, remained hemodynamically stable, transferred to stepdown unit, transfused PRBCs and FFP.  - S/P EGD 10/08/16 (#2): Likely recently bleeding grade 2 esophageal varices with red wale sign, banded and completely eradicated. Scarring from prior banding evident. Mild portal hypertensive gastropathy.  - S/P flexible sigmoidoscopy on  10/08/16 > edematous sigmoid, descending transverse colon due to portal HTN and hypoalbuminemia, no colitis. Scant old blood in sigmoid, likely from UGI source. Nonbleeding internal hemorrhoids, few small rectal polyps were not removed.  - Octreotide that had been restarted on 5/4 to complete 5/7 afternoon. Continue Protonix 40 mg daily. - GI bleeding has clinically stopped. Hemoglobin has remained stable. Follow CBC in a.m. - As per GI, recommend repeat GI/banding in approximately 4 weeks. [name] liver/GI doctor is at [facility]: Dr. Harlen Labs and he can follow up with him. This was discussed with patient and he verbalized understanding. - It is felt that patient's blood pressure may not be able to tolerate a nonselective beta blocker at this time for history of variceal bleed and portal hypertension.  Anemia/ABLA - Status post PRBCs 8 units thus far this admission, last of these 3 since 5/4 and last one on 10/08/16. - Hemoglobin 7.6. Stable.  - Follow CBC in a.m. If hemoglobin 7 or greater, would not transfuse.  Alcoholic liver disease with cirrhosis and ascites, coagulopathy, anasarca;  US paracentesis by IR on 4/26, only 2.1 liters removed. Course complicated by small bowel ileus mostly contributing to significant abdominal distention rather than small volume ascites by ultrasound-hence IR unable to safely attempt diagnostic paracentesis.   Continue rifaximin, lactulose to 20 MG 3 times a day and switched thiamine to by mouth.  Mental status significantly improved over the last couple of days. Remains on empiric Zosyn for presumed SBP-felt less likely-discontinued after 5 days.. Diuretics increased again to Lasix 80 mg daily and Aldactone 200 MG daily. Monitor renal functions closely.  Acute hepatic encephalopathy:  Continue rifaximin  and lactulose. Treated for SBP. CT head > no acute abnormalities noted. MRI brain without acute findings or findings suggestive of Wernicke's encephalopathy.  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 Improving. Lactulose dose reduced. Significantly improved.  Partial SBO/ileus:  Clinically seems to have resolved.  ETOH dependence, with ETOH hepatitis and shock: improving.  Social worker consulted. PLAN FOR SNF on discharge.   Acute respiratory failure possibly from aspiration pneumonia.  Hypoxia resolved. Completed antibiotics twice for presumed pneumonia.  Hypokalemia/hypomagnesemia:  Replaced. Follow periodically.  Leukocytosis:  ? Aspiration, get procalcitonin levels > elevated 0.31>0.27.  Chest x-ray 10/05/16 showed improved left basilar subsegmental atelectasis or infiltrate. Right infrahilar atelectasis or infiltrate. Urine microscopy results appreciated, although turbid, no bacteria seen and only 0-5 WBCs. RBCs probably traumatic from Foley and due to coagulopathy. Completed 5 days of IV Zosyn. Leukocytosis improved. No further concerns for ongoing infection. Discontinued all antibiotics.  Adult failure to thrive inview of multiple co morbidities, multiple admissions, worsening liver issues, Dr. Blake Divine discussed with the brothers and called palliative care consult for goals of care .  Palliative care input appreciated. For now, full scope of treatment. Overall prognosis poor.  Penile pain/microscopic hematuria - No acute findings except penile edema. Suspect catheter is causing his pain. Urine appearance mostly suggestive of bile rather than frank hematuria. Discontinued Foley catheter on 5/4. On 5/5, some difficulty in urinating, bladder scan apparently showed >999 ML but when catheter was placed back only 200 mL obtained. Bladder scan might have picked up ascitic fluid. DC'ed Foley catheter again on 5/6 and voiding without difficulty. Now has a condom catheter.  Sinus tachycardia, mild Multifactorial related to multiple acute medical conditions, deconditioning. Asymptomatic, mild and  stable.  Debilitation/deconditioning - SNF for discharge.  Hyponatremia - Possibly due to hypervolemia/anasarca related to liver disease. Stable. Periodically follow CBCs.  Thrombocytopenia - ? Related to acute blood loss and liver disease. Stable. Follow CBCs.    DVT prophylaxis: SCD'S Code Status: full code.  Family Communication: None at bedside Disposition Plan: SNF on discharge. Transferred to stepdown unit on 5/4. Continue close monitoring in stepdown unit. Pending stability of his hemoglobin, no further clinical GI bleed, possible DC to SNF on 10/11/16 pending bed availability.   Consultants:   Gastroenterology  PCCM.   Neurology   Procedures:  EGD on 4/18  Paracentesis  Foley catheter-discontinued on 5/4  Flexible sigmoidoscopy 10/08/16: Impression:               - Fungal rash found on perianal exam.                           - A few diminutive polyps in the rectum and at the                            recto-sigmoid colon. Resection not attempted.                           - Edematous mucosa in the sigmoid colon, in the                            descending colon and in the transverse colon due to  portal hypertension and hypoalbuminemia. no                            evidence of colitis                           - Scant and scattered, old/clotted blood in the                            sigmoid colon, in the descending colon and in the                            transverse colon. Likely from a proximal GI source,                            with current knowledge most likely from                            intermittent esophageal variceal hemorrhage (see                            EGD report).                           - Non-bleeding internal hemorrhoids.                           - No specimens collected.  EGD 10/08/16: Impression:               - Likely recently bleeding grade II esophageal                            varices.  Completely eradicated. Banded.                           - Mild portal hypertensive gastropathy.                           - Normal examined duodenum.                           - No specimens collected.   Antimicrobials:   zosyn completed 5 days, rocephin and zithromax. 4 days.   Now on IV Zosyn-discontinued 5/7.   Subjective: States that he has had at least 4-5 BMs in the last 24 hours and he has specifically checked with nursing staff who have told him that there has been no blood or black stools. Tolerating diet without abdominal pain. Abdominal distention better. Tolerating diet. Urinating without difficulty after Foley catheter removed.  Objective: Vitals:   10/09/16 2328 10/10/16 0312 10/10/16 0735 10/10/16 1154  BP: 100/63 116/74 99/64 114/76  Pulse: 99 (!) 110 94 (!) 108  Resp: 20 (!) 27 (!) 23 (!) 22  Temp: 98.7 F (37.1 C) 99 F (37.2 C) 98.8 F (37.1 C) 98.8 F (37.1 C)  TempSrc: Oral Oral Oral Oral  SpO2: 96% 94% 92% 94%  Weight:      Height:        Intake/Output  Summary (Last 24 hours) at 10/10/16 1223 Last data filed at 10/10/16 1206  Gross per 24 hour  Intake          1022.08 ml  Output              900 ml  Net           122.08 ml   Filed Weights   10/07/16 1830 10/08/16 0255 10/09/16 0310  Weight: 104.7 kg (230 lb 13.2 oz) 106.7 kg (235 lb 3.7 oz) 109.2 kg (240 lb 11.9 oz)    Examination:  General exam: Pleasant middle-aged male, moderately built and poorly nourished, chronically ill looking, lying comfortably propped up in bed. Scleral icterus-Seems slightly better. Respiratory system: Diminished breath sounds in the bases but otherwise clear to auscultation. No increased work of breathing. Cardiovascular system: S1 & S2 heard, RRR.  3 +  pedal edema/anasarca/edema extending to external genitalia. Telemetry:  SR 90's-ST 100's Gastrointestinal system: Abdomen definitely less distended, soft, not tense, nontender. Penoscrotal edema. No acute findings.  Improved bowel sounds heard. Condom catheter + Central nervous system: Alert and oriented 3. No focal neurological deficits. Asterixis +. Skin: No rashes, lesions or ulcers Psychiatry: Pleasant and answers questions appropriately.    Data Reviewed: I have personally reviewed following labs and imaging studies  CBC:  Recent Labs Lab 10/08/16 0856 10/08/16 1803 10/09/16 0437 10/09/16 1611 10/10/16 0520  WBC 15.7* 14.4* 13.9* 14.7* 13.2*  HGB 7.2* 8.3* 7.2* 8.0* 7.6*  HCT 20.9* 23.7* 20.9* 23.2* 22.2*  MCV 91.7 91.2 90.1 91.3 92.1  PLT 120* 114* 103* 108* 104*   Basic Metabolic Panel:  Recent Labs Lab 10/06/16 0340 10/07/16 0428 10/08/16 0856 10/09/16 0437 10/10/16 0520  NA 129* 129* 127* 128* 128*  K 3.2* 4.1 4.2 4.0 3.7  CL 97* 98* 99* 100* 103  CO2 21* 20* 18* 20* 20*  GLUCOSE 102* 102* 113* 120* 111*  BUN 12 15 29* 23* 14  CREATININE 0.67 0.72 0.96 0.82 0.73  CALCIUM 7.6* 7.4* 7.3* 7.2* 7.3*  MG  --  1.6* 1.8  --   --    GFR: Estimated Creatinine Clearance: 141.2 mL/min (by C-G formula based on SCr of 0.73 mg/dL). Liver Function Tests:  Recent Labs Lab 10/05/16 1228 10/06/16 0340 10/07/16 0428 10/09/16 0437  AST 88* 73* 77* 63*  ALT 61 51 47 34  ALKPHOS 86 76 76 58  BILITOT 9.4* 8.0* 7.5* 9.2*  PROT 6.7 6.3* 6.6 5.3*  ALBUMIN 2.4* 2.2* 2.0* 1.7*    Recent Labs Lab 10/04/16 0403 10/07/16 0428  AMMONIA 56* 42*   Coagulation Profile:  Recent Labs Lab 10/04/16 0403 10/07/16 0428 10/08/16 0856  INR 2.35 3.62 3.06   CBG:  Recent Labs Lab 10/03/16 1622  GLUCAP 103*   Sepsis Labs:  Recent Labs Lab 10/04/16 0701 10/04/16 1247 10/05/16 1228 10/07/16 0428 10/09/16 0437  PROCALCITON  --  0.31 0.27 0.37 0.49  LATICACIDVEN 2.4*  --   --   --   --     No results found for this or any previous visit (from the past 240 hour(s)).       Radiology Studies: No results found.      Scheduled Meds: . feeding supplement  1  Container Oral TID BM  . feeding supplement (ENSURE ENLIVE)  237 mL Oral BID BM  . folic acid  1 mg Oral Daily  . furosemide  80 mg Oral Daily  . hydrocortisone  25 mg Rectal BID  .  lactulose  20 g Oral TID  . mouth rinse  15 mL Mouth Rinse BID  . multivitamin with minerals  1 tablet Oral Daily  . nicotine  21 mg Transdermal Daily  . nystatin ointment   Topical TID  . pantoprazole  40 mg Oral Daily  . rifaximin  550 mg Oral BID  . sodium chloride flush  3 mL Intravenous Q12H  . spironolactone  200 mg Oral Daily  . thiamine  100 mg Oral Daily   Continuous Infusions: . sodium chloride    . octreotide  (SANDOSTATIN)    IV infusion 50 mcg/hr (10/10/16 1053)     LOS: 19 days    Time spent: 35 minutes.    Marcellus Scott, MD, FACP, FHM. [facility] Pager [phone]  If 7PM-7AM, please contact night-coverage [email or web address] Password [facility] 10/10/2016, 12:23 PM

## 2016-10-10 NOTE — Plan of Care (Signed)
Problem: Activity: Goal: Risk for activity intolerance will decrease Outcome: Progressing Pt up in chair for several hours today.  Problem: Nutrition: Goal: Adequate nutrition will be maintained Outcome: Progressing Patient on a soft diet. Tolerating well.   Problem: Bowel/Gastric: Goal: Will not experience complications related to bowel motility Outcome: Not Progressing Patient still with loose bowels, but no longer appear bloody.

## 2016-10-10 NOTE — Progress Notes (Signed)
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  Several loose, non-bloody stools in last 24 hours.  Tolerating soft diet.        OBJECTIVE:         Vital signs in last 24 hours:    Temp:  [98.7 F (37.1 C)-99.1 F (37.3 C)] 98.8 F (37.1 C) (05/07 0735) Pulse Rate:  [94-114] 94 (05/07 0735) Resp:  [20-32] 23 (05/07 0735) BP: (99-121)/(63-80) 99/64 (05/07 0735) SpO2:  [92 %-98 %] 92 % (05/07 0735) Last BM Date: 10/09/16 Filed Weights   10/07/16 1830 10/08/16 0255 10/09/16 0310  Weight: 104.7 kg (230 lb 13.2 oz) 106.7 kg (235 lb 3.7 oz) 109.2 kg (240 lb 11.9 oz)   General: looks better, still slightly tremulous and icteric   Heart: Regular, tachy to 1 teens on monitor.  Chest: clear bil.  No labored breathing or cough Abdomen: less distended but still tense.  NT.  Some tinkling BS.    Extremities: slight LE edema Neuro/Psych:  Oriented x 3.  Appropriate.  Affect still somewhat bizarre.  Overall  His cognition and mental status are greatly improved.   Intake/Output from previous day: 05/06 0701 - 05/07 0700 In: 584.6 [I.V.:584.6] Out: 550 [Urine:550]  Intake/Output this shift: No intake/output data recorded.  Lab Results:  Recent Labs  10/09/16 0437 10/09/16 1611 10/10/16 0520  WBC 13.9* 14.7* 13.2*  HGB 7.2* 8.0* 7.6*  HCT 20.9* 23.2* 22.2*  PLT 103* 108* 104*   BMET  Recent Labs  10/08/16 0856 10/09/16 0437 10/10/16 0520  NA 127* 128* 128*  K 4.2 4.0 3.7  CL 99* 100* 103  CO2 18* 20* 20*  GLUCOSE 113* 120* 111*  BUN 29* 23* 14  CREATININE 0.96 0.82 0.73  CALCIUM 7.3* 7.2* 7.3*   LFT  Recent Labs  10/09/16 0437  PROT 5.3*  ALBUMIN 1.7*  AST 63*  ALT 34  ALKPHOS 58  BILITOT 9.2*   PT/INR  Recent Labs  10/08/16 0856    LABPROT 32.3*  INR 3.06   Hepatitis Panel No results for input(s): HEPBSAG, HCVAB, HEPAIGM, HEPBIGM in the last 72 hours.  Studies/Results: No results found.   Scheduled Meds: . feeding supplement  1 Container Oral TID BM  . feeding supplement (ENSURE ENLIVE)  237 mL Oral BID BM  . folic acid  1 mg Oral Daily  . furosemide  80 mg Oral Daily  . hydrocortisone  25 mg Rectal BID  . lactulose  20 g Oral TID  . mouth rinse  15 mL Mouth Rinse BID  . multivitamin with minerals  1 tablet Oral Daily  . nicotine  21 mg Transdermal Daily  . nystatin ointment   Topical TID  . pantoprazole  40 mg Oral Daily  . rifaximin  550 mg Oral BID  . sodium chloride flush  3 mL Intravenous Q12H  . spironolactone  200 mg Oral Daily  . thiamine  100 mg Oral Daily   Continuous Infusions: . sodium chloride    . octreotide  (SANDOSTATIN)    IV infusion 50 mcg/hr (10/10/16 0600)   PRN Meds:.acetaminophen, fentaNYL (SUBLIMAZE) injection, sodium chloride flush   ASSESMENT:   *  UGIB with hematemesis, melena.  Previous 04/2016 hx esophageal variceal bleeding with EGD/EBL.  09/21/16 EGD # 1: grade 3 bleeding esophageal varices, EBL x 4.   10/08/16 EGD # 2 for recurrent hematochezia, melena: likely recently bleeding grade 2 esophageal varices wit red wale sign, banded and completely eradicated.  Scarring from prior banding evident.  Mild portal hypertensive gastropathy.   10/08/16 Flex sig: edematous sigmoid, descending transverse colon due to portal htn and hypoalbuminemia, no colitis.  Scant old blood in sigmoid, likely from UGI source.  Non-bleeding internal hemorrhoids, few small rectal polyps were not removed.  Restarted Octreotide 5/4, ends today at 1344.  *  Blood loss anemia.  s/p PRBC x 8, three of these in last 4 days, latest on 5/5.    *  Abdominal distention due to SB and colonic ileus.  However despite significant distention: tolerates solids and having regular flatus and BMs.  No N/V.    *   Ascites.  LE edema.     4/26 paracentesis of 2.1 liters, no SBP.  5/3 re attempted tap, not enough ascites to tap.   Doses Lasix and Aldactone dose increased fro 40/150 mg to 80/200 mg by Dr Rhea Belton on 5/6.    *  AMS, multifactorial: HE, TME, ? Underlying mental health disorder.  On Chronulac po and Rifaximin.    *  Coagulopathy, unresponsive to IV and po Vitamin K.  s/p FFP x 5.   *  Thrombocytopenia.       PLAN   *  Finish 72 hours Octreotide today.  Continue daily PO Protonix or other PPI.    *  Repeat EGD/banding in ~ 4 weeks.  Liver/GI doc is at [facility]: Harlen Labs.   Discussed need for repeat EGD with pt.   *  GI will sign off.   *  ? Add non-selective BB for hx variceal bleeding and for heart rate control.  Right now not sure his BP, which runs low, would tolerate BB.      *  Discharge to rehab SNF.  Return to work will depend on his progress over next several weeks.     Jennye Moccasin  10/10/2016, 8:56 AM Pager: [phone]  GI ATTENDING  Interval history data reviewed. Case discussed with Dr. Rhea Belton. Agree with interval progress note as outlined above. Recommendations as outlined in plan. Would NOT start beta blocker. He will need to reestablish care with his primary gastroenterologist [facility], Dr. Margaretha Glassing. We are available for questions or problems. Will sign off.  Wilhemina Bonito. Eda Keys., M.D. [facility] Division of Gastroenterology

## 2016-10-10 NOTE — Progress Notes (Addendum)
   10/10/16 0900  SLP Visit Information  SLP Received On 10/10/16  General Information  Behavior/Cognition Alert;Cooperative;Pleasant mood  Patient Positioning Upright in bed  Oral care provided N/A  HPI 51 yo male with hx of ETOH with cirrhosis with ascites and varices presented with vomiting blood.  He required intubation for airway protection.  He was hypotensive in ER  Temperature Spikes Noted No  Respiratory Status Room air  Oral Cavity - Dentition Adequate natural dentition  Patient observed directly with PO's Yes  Type of PO's observed Regular;Thin liquids  Feeding Able to feed self  Liquids provided via Straw;Cup  Treatment Provided  Treatment provided Dysphagia. Reinforced basic precautions. No further signs of aspriation observed. No SLP f/u needed.   Dysphagia Treatment  Treatment Methods Skilled observation;Patient/caregiver education  SLP - End of Session  Patient left in bed;with call bell/phone within reach  Assessment / Recommendations / Plan  Plan All goals met  Dysphagia Recommendations  Diet recommendations Regular;Thin liquid  Liquids provided via Cup;Straw  Medication Administration Whole meds with liquid  Supervision Patient able to self feed  Postural Changes and/or Swallow Maneuvers Seated upright 90 degrees;Upright 30-60 min after meal  Progression Toward Goals  Progression toward goals Goals met, education completed, patient discharged from SLP  SLP Time Calculation  SLP Start Time (ACUTE ONLY) 5953  SLP Stop Time (ACUTE ONLY) 0910  SLP Time Calculation (min) (ACUTE ONLY) 15 min  SLP Evaluations  $ SLP Speech Visit 1 Procedure  SLP Evaluations  $Swallowing Treatment 1 Procedure

## 2016-10-10 NOTE — Progress Notes (Signed)
   10/10/16 1345  Clinical Encounter Type  Visited With Patient  Visit Type Other (Comment) ([place] consult)  Spiritual Encounters  Spiritual Needs Emotional  Stress Factors  Patient Stress Factors Health changes  Introduction to Pt. Provided emotional support as he spoke about losses in life. Discussed positive options to consider for life.

## 2016-10-11 ENCOUNTER — Encounter (HOSPITAL_COMMUNITY): Payer: Self-pay | Admitting: Student

## 2016-10-11 ENCOUNTER — Inpatient Hospital Stay (HOSPITAL_COMMUNITY): Payer: PRIVATE HEALTH INSURANCE

## 2016-10-11 HISTORY — PX: IR PARACENTESIS: IMG2679

## 2016-10-11 LAB — BASIC METABOLIC PANEL
ANION GAP: 7 (ref 5–15)
BUN: 10 mg/dL (ref 6–20)
CALCIUM: 7.3 mg/dL — AB (ref 8.9–10.3)
CO2: 19 mmol/L — ABNORMAL LOW (ref 22–32)
CREATININE: 0.63 mg/dL (ref 0.61–1.24)
Chloride: 103 mmol/L (ref 101–111)
GFR calc Af Amer: >60 mL/min (ref >60)
GLUCOSE: 141 mg/dL — AB (ref 65–99)
Potassium: 3.6 mmol/L (ref 3.5–5.1)
Sodium: 129 mmol/L — ABNORMAL LOW (ref 135–145)

## 2016-10-11 LAB — CBC
HCT: 21.8 % — ABNORMAL LOW (ref 39.0–52.0)
Hemoglobin: 7.3 g/dL — ABNORMAL LOW (ref 13.0–17.0)
MCH: 31.6 pg (ref 26.0–34.0)
MCHC: 33.5 g/dL (ref 30.0–36.0)
MCV: 94.4 fL (ref 78.0–100.0)
PLATELETS: 109 10*3/uL — AB (ref 150–400)
RBC: 2.31 MIL/uL — ABNORMAL LOW (ref 4.22–5.81)
RDW: 20.1 % — AB (ref 11.5–15.5)
WBC: 12.1 10*3/uL — ABNORMAL HIGH (ref 4.0–10.5)

## 2016-10-11 MED ORDER — LIDOCAINE HCL 1 % IJ SOLN
INTRAMUSCULAR | Status: AC
  Administered 2016-10-11: 6 mL
  Filled 2016-10-11: qty 20

## 2016-10-11 MED ORDER — MORPHINE SULFATE (PF) 4 MG/ML IV SOLN
4.0000 mg | Freq: Once | INTRAVENOUS | Status: AC
  Administered 2016-10-11: 4 mg via INTRAVENOUS
  Filled 2016-10-11: qty 1

## 2016-10-11 MED ORDER — FERUMOXYTOL INJECTION 510 MG/17 ML
510.0000 mg | Freq: Once | INTRAVENOUS | Status: AC
  Administered 2016-10-11: 510 mg via INTRAVENOUS
  Filled 2016-10-11: qty 17

## 2016-10-11 MED ORDER — ALBUMIN HUMAN 25 % IV SOLN
25.0000 g | Freq: Once | INTRAVENOUS | Status: AC
  Administered 2016-10-11: 25 g via INTRAVENOUS
  Filled 2016-10-11: qty 50

## 2016-10-11 MED ORDER — OXYCODONE HCL 5 MG PO TABS
5.0000 mg | ORAL_TABLET | ORAL | Status: DC | PRN
  Administered 2016-10-11 – 2016-10-13 (×10): 5 mg via ORAL
  Filled 2016-10-11 (×10): qty 1

## 2016-10-11 NOTE — Progress Notes (Signed)
Patient had a paracentesis completed this AM. Returned to unit around 1145. Bandaid noted on LLQ. Patient notified RN at 1230 that his gown was saturated on left side/abdomen - appears to be coming from paracentesis site. Dr. Randol KernElgergawy notified. Gauze dressing applied. Will monitor.  [name], Ray Boyar [name], RN

## 2016-10-11 NOTE — Progress Notes (Signed)
PROGRESS NOTE    Ray Hayden  ZOX:[id number] DOB: 10-Jul-1965 DOA: 09/21/2016 PCP: Marcelo Baldy, MD    Brief Narrative: 51 year old alcoholic male with cirrhosis who presents with UGI bleed due to varices that was banded.  Patient was intubated for alcohol withdrawal and EGD. He was extubated on 4/24 and precedex discontinued on 4/25. He was transferred to [facility] on 4/26. Since transfer to [facility], patient had been in out of confusion and encephalopathic. In view of multiple admissions, non compliant to medications, alcohol abuse, palliative care consulted for goals of care. GI has been following the patient for upper GI bleed, ascites and encephalopathy. On 5/4, developed recurrent multiple bloody stools, acute blood loss anemia, remained hemodynamically stable, transferred to stepdown unit, transfused PRBCs and FFP. S/P EGD with variceal banding and flexible sigmoidoscopy on 10/08/16. GI bleed seems to have abated.  Assessment & Plan:   Active Problems:   GI bleed   LFTs abnormal   Encephalopathy, hepatic (HCC)   Liver disease, chronic, with cirrhosis (HCC)   Respiratory failure (HCC)   Abdominal distention   Goals of care, counseling/discussion   Palliative care encounter   Ileus (HCC)   History of esophageal varices with bleeding   Rectal bleeding   Encephalopathy   Variceal Upper GI bleed in cirrhotic with previous hx GI bleeds,  Previous outside hospital, EGDs and banding of esophageal varices dating 12/2015 - 07/2016 at [facility].  EGD 4/18 (#1): grade 3 bleeding esophageal varices. Banded x 4.  On 5/4, developed recurrent hematochezia and melena, acute blood loss anemia, remained hemodynamically stable, transferred to stepdown unit, transfused PRBCs and FFP.  - S/P EGD 10/08/16 (#2): Likely recently bleeding grade 2 esophageal varices with red wale sign, banded and completely eradicated. Scarring from prior banding evident. Mild portal hypertensive gastropathy.  - S/P  flexible sigmoidoscopy on 10/08/16 > edematous sigmoid, descending transverse colon due to portal HTN and hypoalbuminemia, no colitis. Scant old blood in sigmoid, likely from UGI source. Nonbleeding internal hemorrhoids, few small rectal polyps were not removed.  - Octreotide that had been restarted on 5/4 to complete 5/7 afternoon. Continue Protonix 40 mg daily. - No further evidence of GI bleed, hemoglobin overall remained stable, follow CBC in a.m.  - GI consult appreciated, recommendation to repeat GI/banding in approximately 4 weeks, patient to follow with his primary liver/GI doctor at [facility]: Dr. Harlen Labs and he can follow up with him. This was discussed with patient and he verbalized understanding. - It is felt that patient's blood pressure may not be able to tolerate a nonselective beta blocker at this time for history of variceal bleed and portal hypertension.  Anemia/ABLA - Status post PRBCs 8 units thus far this admission, last of these 3 since 5/4 and last one on 10/08/16. - Hemoglobin hemoglobin is 7.3 today, stable, will give one time IV iron today - Follow CBC in a.m. If hemoglobin 7 or greater, would not transfuse.  Alcoholic liver disease with cirrhosis and ascites, coagulopathy, anasarca;  US paracentesis by IR on 4/26, only 2.1 liters removed. Course complicated by small bowel ileus mostly contributing to significant abdominal distention rather than small Continue rifaximin, lactulose to 20 MG 3 times a day and switched thiamine to by mouth.  Mental status significantly improved over the last couple of days. Remains on empiric Zosyn for presumed SBP-felt less likely-discontinued after 5 days.. Diuretics increased again to Lasix 80 mg daily and Aldactone 200 MG daily. Monitor renal functions closely. Patient  with significant ascites today, so ultrasound (this has been performed with total 6.1 L has been drained, still have some significant peritoneal fluid oozing from site,  will continue with dressing changes, will give one-time albumin, appears to be subsiding, if remains with significant oozing will have IR evaluate in a.m..  Acute hepatic encephalopathy:  Continue rifaximin and lactulose. Treated for SBP. CT head > no acute abnormalities noted. MRI brain without acute findings or findings suggestive of Wernicke's encephalopathy. 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 Improving. Lactulose dose reduced. Significantly improved.  Partial SBO/ileus:  Clinically seems to have resolved.  ETOH dependence, with ETOH hepatitis and shock: improving.  Social worker consulted. PLAN FOR SNF on discharge.   Acute respiratory failure possibly from aspiration pneumonia.  Hypoxia resolved. Completed antibiotics twice for presumed pneumonia.  Hypokalemia/hypomagnesemia:  Replaced. Follow periodically.  Leukocytosis:  ? Aspiration, get procalcitonin levels > elevated 0.31>0.27.  Chest x-ray 10/05/16 showed improved left basilar subsegmental atelectasis or infiltrate. Right infrahilar atelectasis or infiltrate. Urine microscopy results appreciated, although turbid, no bacteria seen and only 0-5 WBCs. RBCs probably traumatic from Foley and due to coagulopathy. Completed 5 days of IV Zosyn. Leukocytosis improved. No further concerns for ongoing infection. Discontinued all antibiotics.  Adult failure to thrive inview of multiple co morbidities, multiple admissions, worsening liver issues, Dr. Blake Divine discussed with the brothers and called palliative care consult for goals of care .  Palliative care input appreciated. For now, full scope of treatment. Overall prognosis poor.  Penile pain/microscopic hematuria - No acute findings except penile edema. Suspect catheter is causing his pain. Urine appearance mostly suggestive of bile rather than frank hematuria. Discontinued Foley catheter on 5/4. On 5/5, some difficulty in urinating,  bladder scan apparently showed >999 ML but when catheter was placed back only 200 mL obtained. Bladder scan might have picked up ascitic fluid. DC'ed Foley catheter again on 5/6 and voiding without difficulty. Now has a condom catheter.  Sinus tachycardia, mild Multifactorial related to multiple acute medical conditions, deconditioning. Asymptomatic, mild and stable.  Debilitation/deconditioning - SNF for discharge.  Hyponatremia - Possibly due to hypervolemia/anasarca related to liver disease. Stable. Periodically follow CBCs.  Thrombocytopenia - ? Related to acute blood loss and liver disease. Stable. Follow CBCs.    DVT prophylaxis: SCD'S Code Status: full code.  Family Communication: None at bedside Disposition Plan: SNF on discharge. Transferred to stepdown unit on 5/4. Very likely discharge in a.m. if hemoglobin remained stable, and paracentesis site stop oozing   Consultants:   Gastroenterology  PCCM.   Neurology   Procedures:  EGD on 4/18  Paracentesis  Foley catheter-discontinued on 5/4  Flexible sigmoidoscopy 10/08/16: Impression:               - Fungal rash found on perianal exam.                           - A few diminutive polyps in the rectum and at the                            recto-sigmoid colon. Resection not attempted.                           - Edematous mucosa in the sigmoid colon, in the  descending colon and in the transverse colon due to                            portal hypertension and hypoalbuminemia. no                            evidence of colitis                           - Scant and scattered, old/clotted blood in the                            sigmoid colon, in the descending colon and in the                            transverse colon. Likely from a proximal GI source,                            with current knowledge most likely from                            intermittent esophageal variceal hemorrhage  (see                            EGD report).                           - Non-bleeding internal hemorrhoids.                           - No specimens collected.  EGD 10/08/16: Impression:               - Likely recently bleeding grade II esophageal                            varices. Completely eradicated. Banded.                           - Mild portal hypertensive gastropathy.                           - Normal examined duodenum.                           - No specimens collected.   Antimicrobials:   zosyn completed 5 days, rocephin and zithromax. 4 days.   Now on IV Zosyn-discontinued 5/7.   Subjective: Denies any fever or chills, complains of lower back pain, reports feeling his abdomen is more distended today, tolerating diet,    Objective: Vitals:   10/11/16 1125 10/11/16 1126 10/11/16 1127 10/11/16 1146  BP: 120/78   106/72  Pulse: 91 91 94 99  Resp: (!) 29 (!) 31 (!) 24 (!) 26  Temp:      TempSrc:      SpO2: 98% 97% 98% 98%  Weight:      Height:        Intake/Output Summary (Last 24 hours) at 10/11/16 1531  Last data filed at 10/11/16 0900  Gross per 24 hour  Intake          1465.83 ml  Output             1075 ml  Net           390.83 ml   Filed Weights   10/08/16 0255 10/09/16 0310 10/11/16 0321  Weight: 106.7 kg (235 lb 3.7 oz) 109.2 kg (240 lb 11.9 oz) 109.2 kg (240 lb 11.9 oz)    Examination:  General exam: Chronically ill-appearing male, laying comfortable in bed in no apparent distress  Respiratory system: Diminished air entry at the bases, otherwise clear to auscultation, no wheezing, no use of accessory muscle Cardiovascular system: S1 & S2 heard, RRR.  3 +  pedal edema/anasarca/edema extending to external genitalia. Telemetry:  SR 90's-ST 100's Gastrointestinal system: Abdomen significantly distended, with shifting dullness, and significant ascites  Central nervous system: Alert and oriented 3. No focal neurological deficits. Asterixis +. Skin: No  rashes, lesions or ulcers Psychiatry: Pleasant and answers questions appropriately.    Data Reviewed: I have personally reviewed following labs and imaging studies  CBC:  Recent Labs Lab 10/08/16 1803 10/09/16 0437 10/09/16 1611 10/10/16 0520 10/11/16 0324  WBC 14.4* 13.9* 14.7* 13.2* 12.1*  HGB 8.3* 7.2* 8.0* 7.6* 7.3*  HCT 23.7* 20.9* 23.2* 22.2* 21.8*  MCV 91.2 90.1 91.3 92.1 94.4  PLT 114* 103* 108* 104* 109*   Basic Metabolic Panel:  Recent Labs Lab 10/07/16 0428 10/08/16 0856 10/09/16 0437 10/10/16 0520 10/11/16 0324  NA 129* 127* 128* 128* 129*  K 4.1 4.2 4.0 3.7 3.6  CL 98* 99* 100* 103 103  CO2 20* 18* 20* 20* 19*  GLUCOSE 102* 113* 120* 111* 141*  BUN 15 29* 23* 14 10  CREATININE 0.72 0.96 0.82 0.73 0.63  CALCIUM 7.4* 7.3* 7.2* 7.3* 7.3*  MG 1.6* 1.8  --   --   --    GFR: Estimated Creatinine Clearance: 141.2 mL/min (by C-G formula based on SCr of 0.63 mg/dL). Liver Function Tests:  Recent Labs Lab 10/05/16 1228 10/06/16 0340 10/07/16 0428 10/09/16 0437  AST 88* 73* 77* 63*  ALT 61 51 47 34  ALKPHOS 86 76 76 58  BILITOT 9.4* 8.0* 7.5* 9.2*  PROT 6.7 6.3* 6.6 5.3*  ALBUMIN 2.4* 2.2* 2.0* 1.7*    Recent Labs Lab 10/07/16 0428  AMMONIA 42*   Coagulation Profile:  Recent Labs Lab 10/07/16 0428 10/08/16 0856  INR 3.62 3.06   CBG: No results for input(s): GLUCAP in the last 168 hours. Sepsis Labs:  Recent Labs Lab 10/05/16 1228 10/07/16 0428 10/09/16 0437  PROCALCITON 0.27 0.37 0.49    No results found for this or any previous visit (from the past 240 hour(s)).       Radiology Studies: Ir Paracentesis  Result Date: 10/11/2016 INDICATION: Recurrent ascites.  Request is made for therapeutic paracentesis. EXAM: ULTRASOUND GUIDED THERAPEUTIC PARACENTESIS MEDICATIONS: 10 mL 1% lidocaine COMPLICATIONS: None immediate. PROCEDURE: Informed written consent was obtained from the patient after a discussion of the risks, benefits and  alternatives to treatment. A timeout was performed prior to the initiation of the procedure. Initial ultrasound scanning demonstrates a large amount of ascites within the left lateral abdomen. The left lateral abdomen was prepped and draped in the usual sterile fashion. 1% lidocaine was used for local anesthesia. Following this, a 19 gauge, 7-cm, Yueh catheter was introduced. An ultrasound image was saved for documentation purposes.  The paracentesis was performed. The catheter was removed and a dressing was applied. The patient tolerated the procedure well without immediate post procedural complication. FINDINGS: A total of approximately 5.6 liters of clear, yellow fluid was removed. IMPRESSION: Successful ultrasound-guided paracentesis yielding 5.6 liters of peritoneal fluid. Read by:  Loyce DysKacie Matthews PA-C Electronically Signed   By: Gilmer MorJaime  Wagner D.O.   On: 10/11/2016 13:42        Scheduled Meds: . feeding supplement  1 Container Oral TID BM  . feeding supplement (ENSURE ENLIVE)  237 mL Oral BID BM  . folic acid  1 mg Oral Daily  . furosemide  80 mg Oral Daily  . hydrocortisone  25 mg Rectal BID  . lactulose  20 g Oral TID  . mouth rinse  15 mL Mouth Rinse BID  . multivitamin with minerals  1 tablet Oral Daily  . nicotine  21 mg Transdermal Daily  . nystatin ointment   Topical TID  . pantoprazole  40 mg Oral Daily  . rifaximin  550 mg Oral BID  . sodium chloride flush  3 mL Intravenous Q12H  . spironolactone  200 mg Oral Daily  . thiamine  100 mg Oral Daily   Continuous Infusions:    LOS: 20 days     Savon Cobbs, MD,  [facility] Pager [phone]  If 7PM-7AM, please contact night-coverage [email or web address] Password [email or web address] 10/11/2016, 3:31 PM

## 2016-10-11 NOTE — Procedures (Signed)
PROCEDURE SUMMARY:  Successful US guided paracentesis from left lateral abdomen.  Yielded 5.9 liters of clear, yellow fluid.  No immediate complications.  Pt tolerated well.   Specimen was not sent for labs.  Hoyt KochKacie Sue-Ellen Matthews PA-C 10/11/2016 1:40 PM

## 2016-10-12 LAB — CBC
HEMATOCRIT: 24.3 % — AB (ref 39.0–52.0)
HEMOGLOBIN: 8.3 g/dL — AB (ref 13.0–17.0)
MCH: 32.3 pg (ref 26.0–34.0)
MCHC: 34.2 g/dL (ref 30.0–36.0)
MCV: 94.6 fL (ref 78.0–100.0)
Platelets: 115 10*3/uL — ABNORMAL LOW (ref 150–400)
RBC: 2.57 MIL/uL — AB (ref 4.22–5.81)
RDW: 20 % — AB (ref 11.5–15.5)
WBC: 15.2 10*3/uL — AB (ref 4.0–10.5)

## 2016-10-12 LAB — BASIC METABOLIC PANEL
ANION GAP: 8 (ref 5–15)
BUN: 7 mg/dL (ref 6–20)
CALCIUM: 7.8 mg/dL — AB (ref 8.9–10.3)
CO2: 21 mmol/L — AB (ref 22–32)
Chloride: 101 mmol/L (ref 101–111)
Creatinine, Ser: 0.58 mg/dL — ABNORMAL LOW (ref 0.61–1.24)
GFR calc non Af Amer: >60 mL/min (ref >60)
Glucose, Bld: 122 mg/dL — ABNORMAL HIGH (ref 65–99)
POTASSIUM: 3.7 mmol/L (ref 3.5–5.1)
Sodium: 130 mmol/L — ABNORMAL LOW (ref 135–145)

## 2016-10-12 MED ORDER — HYDROCORTISONE ACETATE 25 MG RE SUPP: 25.0000 mg | Freq: Two times a day (BID) | RECTAL | 0 refills | Status: DC

## 2016-10-12 MED ORDER — BOOST / RESOURCE BREEZE PO LIQD: 1.0000 | Freq: Three times a day (TID) | ORAL | 0 refills | Status: DC

## 2016-10-12 MED ORDER — RIFAXIMIN 550 MG PO TABS: 550.0000 mg | ORAL_TABLET | Freq: Two times a day (BID) | ORAL | Status: DC

## 2016-10-12 MED ORDER — FUROSEMIDE 40 MG PO TABS: 80.0000 mg | ORAL_TABLET | Freq: Every day | ORAL | Status: AC

## 2016-10-12 MED ORDER — OXYCODONE HCL 5 MG PO TABS: 5.0000 mg | ORAL_TABLET | Freq: Four times a day (QID) | ORAL | 0 refills | Status: DC | PRN

## 2016-10-12 MED ORDER — SPIRONOLACTONE 100 MG PO TABS: 200.0000 mg | ORAL_TABLET | Freq: Every day | ORAL | Status: AC

## 2016-10-12 MED ORDER — LACTULOSE 10 GM/15ML PO SOLN: 20.0000 g | Freq: Three times a day (TID) | ORAL | 0 refills | Status: AC

## 2016-10-12 NOTE — Progress Notes (Signed)
Physical Therapy Treatment Patient Details Name: Ray Hayden MRN: [id number] DOB: 11-28-1965 Today's Date: 10/12/2016    History of Present Illness 51 year old alcoholic male adm 09/21/2016 with UGI bleed due to varices that was banded. Patient vomiting blood & intubated for airway protection in setting of ETOH withdrawal; He was extubated on 4/24; PMHx: cirrhosis, ETOH, smoker, HTN    PT Comments    Patient continues to progress toward mobility goals requiring less assistance for all mobility this session. Pt with limited gait due to c/o dizziness in standing and pain. VSS throughout session. Pt premedicated. Continue to progress as tolerated with anticipated d/c to SNF for further skilled PT services.    Follow Up Recommendations  SNF     Equipment Recommendations  None recommended by PT    Recommendations for Other Services       Precautions / Restrictions Precautions Precautions: Fall    Mobility  Bed Mobility Overal bed mobility: Needs Assistance Bed Mobility: Sit to Supine       Sit to supine: Min guard   General bed mobility comments: cues for sequencing/technique to decrease abdominal pain   Transfers Overall transfer level: Needs assistance Equipment used: Rolling walker (2 wheeled) Transfers: Sit to/from Stand Sit to Stand: Min guard         General transfer comment: min guard for safety; cues for safe hand placement with each trial  Ambulation/Gait Ambulation/Gait assistance: Min assist Ambulation Distance (Feet): 60 Feet Assistive device: Rolling walker (2 wheeled) Gait Pattern/deviations: Step-through pattern;Decreased stride length Gait velocity: Decreased   General Gait Details: cues for posture; heavy reliance on RW for support; slow, steady gait; seated rest break X2 due to c/o dizziness; VSS   Stairs            Wheelchair Mobility    Modified Rankin (Stroke Patients Only)       Balance Overall balance assessment: Needs  assistance Sitting-balance support: No upper extremity supported;Feet supported Sitting balance-Leahy Scale: Good     Standing balance support: Bilateral upper extremity supported;During functional activity Standing balance-Leahy Scale: Poor                              Cognition Arousal/Alertness: Awake/alert Behavior During Therapy: WFL for tasks assessed/performed Overall Cognitive Status: Within Functional Limits for tasks assessed                                        Exercises      General Comments        Pertinent Vitals/Pain Pain Assessment: Faces Faces Pain Scale: Hurts little more Pain Location: abdomen Pain Descriptors / Indicators: Aching;Sharp Pain Intervention(s): Limited activity within patient's tolerance;Monitored during session;Premedicated before session    Home Living                      Prior Function            PT Goals (current goals can now be found in the care plan section) Progress towards PT goals: Progressing toward goals    Frequency    Min 3X/week      PT Plan Current plan remains appropriate    Co-evaluation              AM-PAC PT "6 Clicks" Daily Activity  Outcome Measure  Difficulty turning over in  bed (including adjusting bedclothes, sheets and blankets)?: A Little Difficulty moving from lying on back to sitting on the side of the bed? : A Lot Difficulty sitting down on and standing up from a chair with arms (e.g., wheelchair, bedside commode, etc,.)?: A Lot Help needed moving to and from a bed to chair (including a wheelchair)?: A Little Help needed walking in hospital room?: A Little Help needed climbing 3-5 steps with a railing? : A Little 6 Click Score: 16    End of Session Equipment Utilized During Treatment: Gait belt Activity Tolerance: Patient tolerated treatment well Patient left: in bed;with call bell/phone within reach Nurse Communication: Mobility status PT  Visit Diagnosis: Muscle weakness (generalized) (M62.81);Unsteadiness on feet (R26.81);Other abnormalities of gait and mobility (R26.89)     Time: [id number] PT Time Calculation (min) (ACUTE ONLY): 20 min  Charges:  $Gait Training: 8-22 mins                    G Codes:       Ray Hayden, PTA Pager: [phone]     Ray Hayden 10/12/2016, 3:26 PM

## 2016-10-12 NOTE — Progress Notes (Signed)
Pt c/o 9/10 excruciating pain in abdomen.  Pt was given PRN oxycodone with no improvement earlier.  Pt states current medication does not decrease pain.  NP Craige CottaKirby on call notified, orders given.  Will continue to monitor.

## 2016-10-12 NOTE — Discharge Summary (Addendum)
Physician Discharge Summary  Ray Hayden ZOX:[id number] DOB: 03-22-1966 DOA: 09/21/2016  PCP: Marcelo Baldy, MD  Admit date: 09/21/2016 Discharge date: 10/13/2016  Admitted From:home Disposition:SNF  Recommendations for Outpatient Follow-up:  1. Follow up with PCP in 1-2 weeks 2. Please obtain BMP/CBC in one week 3. Please follow up with your GI in 1-2 weeks.   Home Health:SNF Equipment/Devices:none Discharge Condition:stable CODE STATUS:full code Diet recommendation:low salt diet  Brief/Interim Summary: 51 year old alcoholic male with cirrhosis who presents with UGI bleed due to varices that was banded. Patient was intubated for alcohol withdrawal and EGD. He was extubated on 4/24 and precedex discontinued on 4/25. He was transferred to [facility] on 4/26. Since transfer to [facility], patient had been in out of confusion and encephalopathic. In view of multiple admissions, non compliant to medications, alcohol abuse, palliative care consulted for goals of care. GI has been following the patient for upper GI bleed, ascites and encephalopathy. On 5/4, developed recurrent multiple bloody stools, acute blood loss anemia, remained hemodynamically stable, transferred to stepdown unit, transfused PRBCs and FFP. S/P EGD with variceal banding and flexible sigmoidoscopy on 10/08/16. GI bleed seems to have abated.  # Variceal Upper GI bleed in cirrhotic with previous hx GI bleeds,  Previous outside hospital, EGDs and banding of esophageal varices dating 12/2015 - 07/2016 at [facility]  EGD 4/18 (#1): grade 3 bleeding esophageal varices. Banded x 4.  On 5/4, developed recurrent hematochezia and melena, acute blood loss anemia, remained hemodynamically stable, transferred to stepdown unit, transfused PRBCs and FFP.  - S/P EGD 10/08/16 (#2): Likely recently bleeding grade 2 esophageal varices with red wale sign, banded and completely eradicated. Scarring from prior banding evident. Mild portal hypertensive  gastropathy.  - S/P flexible sigmoidoscopy on 10/08/16 > edematous sigmoid, descending transverse colon due to portal HTN and hypoalbuminemia, no colitis. Scant old blood in sigmoid, likely from UGI source. Nonbleeding internal hemorrhoids, few small rectal polyps were not removed.  - Octreotide that had been restarted on 5/4 to complete 5/7 afternoon. Continue Protonix 40 mg daily.  No further GI bleeding occurred. Hemoglobin is stable. Patient will follow-up with his GI doctor at [facility], Dr. Margaretha Glassing. He verbalized understanding. - It is felt that patient's blood pressure may not be able to tolerate a nonselective beta blocker at this time for history of variceal bleed and portal hypertension.  # Acute blood loss anemia: - Status post red blood cell and IV iron transfusion. Hemoglobin is stable. He recommended MiraLAX outpatient.  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 Course complicated by small bowel ileus mostly contributing to significant abdominal distention rather than small Continue rifaximin, lactulose to 20 MG 3 times a day   Received empiric Zosyn for presumed SBP-felt less likely-discontinued after 5 days.. Diuretics increased to Lasix 80 mg daily and Aldactone 200 MG daily.   Patient underwent another ultrasound paracentesis by IR on 5/8 with removal of almost 6 L of fluid. Patient reported that his abdomen pain is significantly better. I discussed with him regarding close follow-up with GI, he may need frequent outpatient abdominal paracentesis for symptomatic treatment.  # Acute hepatic encephalopathy:  Continue rifaximin and lactulose. Treated for SBP. CT head > no acute abnormalities noted. MRI brain without acute findings or findings suggestive of Wernicke's encephalopathy. Neurology follow-up appreciated  suspect not Wernicke's encephalopathy.  Mental status seems improved. On  oral lactulose.  # Partial  SBO/ileus:  Clinically improved. Able to tolerate diet.  # ETOH dependence, with ETOH hepatitis and shock: Rehabilitation and discharge to a skilled nursing facility.  # Acute respiratory failure possibly from aspiration pneumonia.  Hypoxia resolved. Completed antibiotics twice for presumed pneumonia.  #Hypokalemia/hypomagnesemia:  Replaced. Recommended to monitor labs in 1-2 weeks.  # Adult failure to thrive Evaluated by palliative care service. Continue supportive treatment and current management.  # Hyponatremia - Possibly due to hypervolemia/anasarca related to liver disease. Monitor labs.  #Thrombocytopenia -Clinically in the setting of liver disease. No sign of bleeding.  Patient with prolonged hospitalization with a complicated clinical course. Patient reported that he feels significantly better after yesterday's abdominal paracentesis. He is eager to leave hospital. Denied nausea vomiting chest pain or shortness of breath. Abdomen pain is significantly better. He verbalized understanding of follow-up with his PCP and GI outpatient. Able to tolerate diet. At this time patient is medically stable to transfer his care to outpatient with close follow-up.  Discharge Diagnoses:  Active Problems:   Upper GI bleed   LFTs abnormal   Encephalopathy, hepatic (HCC)   Liver disease, chronic, with cirrhosis (HCC)   Respiratory failure (HCC)   Abdominal distention   Goals of care, counseling/discussion   Palliative care encounter   Ileus (HCC)   History of esophageal varices with bleeding   Rectal bleeding   Encephalopathy    Discharge Instructions  Discharge Instructions    Call MD for:  difficulty breathing, headache or visual disturbances    Complete by:  As directed    Call MD for:  extreme fatigue    Complete by:  As directed    Call MD for:  hives    Complete by:  As directed    Call MD for:  persistant dizziness or light-headedness     Complete by:  As directed    Call MD for:  persistant nausea and vomiting    Complete by:  As directed    Call MD for:  severe uncontrolled pain    Complete by:  As directed    Call MD for:  temperature >100.4    Complete by:  As directed    Diet - low sodium heart healthy    Complete by:  As directed    Discharge instructions    Complete by:  As directed    Please follow up with your GI physician. You may need frequent abdomen paracentesis.   Increase activity slowly    Complete by:  As directed      Allergies as of 10/13/2016      Reactions   Sulfa Antibiotics       Medication List    STOP taking these medications   ATIVAN PO   carbamazepine 200 MG tablet Commonly known as:  TEGRETOL     TAKE these medications   busPIRone 15 MG tablet Commonly known as:  BUSPAR Take 15 mg by mouth daily.   feeding supplement Liqd Take 1 Container by mouth 3 (three) times daily between meals.   furosemide 40 MG tablet Commonly known as:  LASIX Take 2 tablets (80 mg total) by mouth daily. What changed:  how much to take   gabapentin 300 MG capsule Commonly known as:  NEURONTIN Take 300 mg by mouth 3 (three) times daily.   hydrocortisone 25 MG suppository Commonly known as:  ANUSOL-HC Place 1 suppository (25 mg total) rectally 2 (two) times daily.   lactulose 10 GM/15ML solution Commonly known as:  CHRONULAC Take 30 mLs (20 g total) by mouth 3 (three) times daily. What changed:  how much to take  when to take this  reasons to take this   magnesium oxide 400 MG tablet Commonly known as:  MAG-OX Take 400 mg by mouth 2 (two) times daily.   Melatonin 3 MG Tabs Take 1 tablet by mouth at bedtime.   multivitamin with minerals Tabs tablet Take 1 tablet by mouth daily.   oxyCODONE 5 MG immediate release tablet Commonly known as:  Oxy IR/ROXICODONE Take 1 tablet (5 mg total) by mouth every 6 (six) hours as needed for severe pain.   pantoprazole 40 MG tablet Commonly  known as:  PROTONIX Take 40 mg by mouth 2 (two) times daily.   rifaximin 550 MG Tabs tablet Commonly known as:  XIFAXAN Take 1 tablet (550 mg total) by mouth 2 (two) times daily.   spironolactone 100 MG tablet Commonly known as:  ALDACTONE Take 2 tablets (200 mg total) by mouth daily. What changed:  how much to take   thiamine 100 MG tablet Commonly known as:  VITAMIN B-1 Take 100 mg by mouth daily.   venlafaxine XR 150 MG 24 hr capsule Commonly known as:  EFFEXOR-XR Take 150 mg by mouth daily with breakfast.       Contact information for follow-up providers    Darolyn Rua, MD. Schedule an appointment as soon as possible for a visit.   Specialty:  Internal Medicine Why:  call the office to get seen by Dr Margaretha Glassing within 30 days of discharging from Rehab, this is your liver/GI MD.   Contact information: [address] [place] [address] [phone]        Marcelo Baldy, MD. Schedule an appointment as soon as possible for a visit in 1 week(s).   Specialty:  Internal Medicine Contact information: [address] [place] [address] [phone]            Contact information for after-discharge care    Destination    [name]F Follow up.   Specialty:  Skilled Nursing Facility Contact information: [address] [place] [address] [phone]                 Allergies  Allergen Reactions  . Sulfa Antibiotics     Consultations:  Gastroenterology  PCCM.   Neurology  Procedures/Studies: EGD, paracentesis, sigmoidoscopy  Subjective: Patient was seen and examined at bedside. Reported feeling better. Abdomen pain and distention is significantly better. Denied fever, chills, nausea, vomiting, chest pain or shortness of breath.  Discharge Exam: Vitals:   10/13/16 0310 10/13/16 0832  BP: 114/74 99/66  Pulse: (!) 106 (!) 106  Resp: (!) 32 (!) 22  Temp: 98 F (36.7  C) 98.3 F (36.8 C)   Vitals:   10/12/16 2355 10/13/16 0307 10/13/16 0310 10/13/16 0832  BP: 96/60  114/74 99/66  Pulse: (!) 107  (!) 106 (!) 106  Resp: 19  (!) 32 (!) 22  Temp:   98 F (36.7 C) 98.3 F (36.8 C)  TempSrc:   Axillary Oral  SpO2: 94%  95% 94%  Weight:  101.2 kg (223 lb 1.7 oz)    Height:        General: Pt is alert, awake, not in acute distress Cardiovascular: RRR, S1/S2 +, no rubs, no gallops Respiratory: CTA bilaterally, no wheezing, no rhonchi Abdominal: Soft, distended, nontender, bowel sound positive Extremities: Trace  lower extremity edema, no cyanosis    The results of significant diagnostics from this hospitalization (including imaging, microbiology, ancillary and laboratory) are listed below for reference.     Microbiology: No results found for this or any previous visit (from the past 240 hour(s)).   Labs: BNP (last 3 results) No results for input(s): BNP in the last 8760 hours. Basic Metabolic Panel:  Recent Labs Lab 10/07/16 0428 10/08/16 0856 10/09/16 0437 10/10/16 0520 10/11/16 0324 10/12/16 0155  NA 129* 127* 128* 128* 129* 130*  K 4.1 4.2 4.0 3.7 3.6 3.7  CL 98* 99* 100* 103 103 101  CO2 20* 18* 20* 20* 19* 21*  GLUCOSE 102* 113* 120* 111* 141* 122*  BUN 15 29* 23* 14 10 7   CREATININE 0.72 0.96 0.82 0.73 0.63 0.58*  CALCIUM 7.4* 7.3* 7.2* 7.3* 7.3* 7.8*  MG 1.6* 1.8  --   --   --   --    Liver Function Tests:  Recent Labs Lab 10/07/16 0428 10/09/16 0437  AST 77* 63*  ALT 47 34  ALKPHOS 76 58  BILITOT 7.5* 9.2*  PROT 6.6 5.3*  ALBUMIN 2.0* 1.7*   No results for input(s): LIPASE, AMYLASE in the last 168 hours.  Recent Labs Lab 10/07/16 0428  AMMONIA 42*   CBC:  Recent Labs Lab 10/09/16 1611 10/10/16 0520 10/11/16 0324 10/12/16 0155 10/13/16 0738  WBC 14.7* 13.2* 12.1* 15.2* 24.0*  HGB 8.0* 7.6* 7.3* 8.3* 8.4*  HCT 23.2* 22.2* 21.8* 24.3* 24.2*  MCV 91.3 92.1 94.4 94.6 93.8  PLT 108* 104* 109* 115*  137*   Cardiac Enzymes: No results for input(s): CKTOTAL, CKMB, CKMBINDEX, TROPONINI in the last 168 hours. BNP: Invalid input(s): POCBNP CBG: No results for input(s): GLUCAP in the last 168 hours. D-Dimer No results for input(s): DDIMER in the last 72 hours. Hgb A1c No results for input(s): HGBA1C in the last 72 hours. Lipid Profile No results for input(s): CHOL, HDL, LDLCALC, TRIG, CHOLHDL, LDLDIRECT in the last 72 hours. Thyroid function studies No results for input(s): TSH, T4TOTAL, T3FREE, THYROIDAB in the last 72 hours.  Invalid input(s): FREET3 Anemia work up No results for input(s): VITAMINB12, FOLATE, FERRITIN, TIBC, IRON, RETICCTPCT in the last 72 hours. Urinalysis    Component Value Date/Time   COLORURINE AMBER (A) 10/06/2016 1233   APPEARANCEUR TURBID (A) 10/06/2016 1233   LABSPEC 1.021 10/06/2016 1233   PHURINE 5.0 10/06/2016 1233   GLUCOSEU NEGATIVE 10/06/2016 1233   HGBUR LARGE (A) 10/06/2016 1233   BILIRUBINUR NEGATIVE 10/06/2016 1233   KETONESUR NEGATIVE 10/06/2016 1233   PROTEINUR 30 (A) 10/06/2016 1233   NITRITE NEGATIVE 10/06/2016 1233   LEUKOCYTESUR NEGATIVE 10/06/2016 1233   Sepsis Labs Invalid input(s): PROCALCITONIN,  WBC,  LACTICIDVEN Microbiology No results found for this or any previous visit (from the past 240 hour(s)).   Time coordinating discharge: 32 minutes  SIGNED:   Maxie Barbron Prasad Jerimyah Vandunk, MD  [facility] 10/13/2016, 11:15 AM  If 7PM-7AM, please contact night-coverage [email or web address] Password [email or web address]

## 2016-10-12 NOTE — Care Management Note (Signed)
Case Management Note  Patient Details  Name: Ray Hayden MRN: [id number] Date of Birth: 09/06/1965  Subjective/Objective:  Patient for dc to snf today, CSW following.                  Action/Plan:   Expected Discharge Date:  10/12/16               Expected Discharge Plan:  Skilled Nursing Facility  In-House Referral:  Clinical Social Work  Discharge planning Services  CM Consult  Post Acute Care Choice:    Choice offered to:     DME Arranged:    DME Agency:     HH Arranged:    HH Agency:     Status of Service:  Completed, signed off  If discussed at [employer] Meetings, dates discussed:    Additional Comments:  Leone Havenaylor, Carleigh Buccieri [place], RN 10/12/2016, 4:03 PM

## 2016-10-12 NOTE — NC FL2 (Signed)
[place] MEDICAID FL2 LEVEL OF CARE SCREENING TOOL     IDENTIFICATION  Patient Name: Ray Hayden Birthdate: 09/19/1965 Sex: male Admission Date (Current Location): 09/21/2016  [facility] [place]   Facility and Address:  [facility], [address], [place], [place] [address]      Provider Number: [id number]  Attending Physician Name and Address:  Maxie BarbBhandari, Dron Prasad, MD  Relative Name and Phone Number:       Current Level of Care: Hospital Recommended Level of Care: Skilled Nursing Facility Prior Approval Number:    Date Approved/Denied:   PASRR Number: [id number]  Discharge Plan: SNF    Current Diagnoses: Patient Active Problem List   Diagnosis Date Noted  . Encephalopathy   . Ileus (HCC)   . History of esophageal varices with bleeding   . Rectal bleeding   . Goals of care, counseling/discussion   . Palliative care encounter   . Abdominal distention   . Liver disease, chronic, with cirrhosis (HCC)   . Respiratory failure (HCC)   . Encephalopathy, hepatic (HCC)   . LFTs abnormal   . GI bleed 09/21/2016  . Hematemesis   . Acute blood loss anemia   . Alcoholic cirrhosis of liver with ascites (HCC)   . Ascites due to alcoholic cirrhosis (HCC)   . Coagulopathy (HCC)   . Hypovolemic shock (HCC)   . Lactic acidosis   . Bleeding esophageal varices (HCC)     Orientation RESPIRATION BLADDER Height & Weight     Self, Time, Situation, Place  Normal External catheter Weight: 239 lb 13.8 oz (108.8 kg) Height:  6\' 6"  (198.1 cm)  BEHAVIORAL SYMPTOMS/MOOD NEUROLOGICAL BOWEL NUTRITION STATUS      Continent (hemorrhoids) Diet (Please see DC Summary)  AMBULATORY STATUS COMMUNICATION OF NEEDS Skin   Extensive Assist Verbally Normal                       Personal Care Assistance Level of Assistance  Bathing, Feeding, Dressing Bathing Assistance: Maximum assistance Feeding assistance: Limited assistance Dressing  Assistance: Limited assistance     Functional Limitations Info             SPECIAL CARE FACTORS FREQUENCY  Speech therapy     PT Frequency: 5x/week       Speech Therapy Frequency: 2x/week      Contractures      Additional Factors Info  Code Status, Allergies, Insulin Sliding Scale Code Status Info: Full Allergies Info: Sulfa Antibiotics   Insulin Sliding Scale Info: Every 4 hours       Current Medications (10/12/2016):  This is the current hospital active medication list Current Facility-Administered Medications  Medication Dose Route Frequency Provider Last Rate Last Dose  . acetaminophen (TYLENOL) tablet 325 mg  325 mg Oral Q6H PRN Alyson ReedyYacoub, Wesam G, MD   325 mg at 09/30/16 2100  . feeding supplement (BOOST / RESOURCE BREEZE) liquid 1 Container  1 Container Oral TID BM Elease EtienneHongalgi, Anand D, MD   1 Container at 10/12/16 1400  . folic acid (FOLVITE) tablet 1 mg  1 mg Oral Daily Alyson ReedyYacoub, Wesam G, MD   1 mg at 10/12/16 0941  . furosemide (LASIX) tablet 80 mg  80 mg Oral Daily Beverley FiedlerPyrtle, Jay M, MD   80 mg at 10/12/16 0942  . hydrocortisone (ANUSOL-HC) suppository 25 mg  25 mg Rectal BID Dianah FieldGribbin, Sarah J, PA-C   25 mg at 10/09/16 0957  .  lactulose (CHRONULAC) 10 GM/15ML solution 20 g  20 g Oral TID Dianah Field, PA-C   20 g at 10/12/16 0941  . MEDLINE mouth rinse  15 mL Mouth Rinse BID Coralyn Helling, MD   15 mL at 10/12/16 1230  . multivitamin with minerals tablet 1 tablet  1 tablet Oral Daily Alyson Reedy, MD   1 tablet at 10/12/16 0939  . nicotine (NICODERM CQ - dosed in mg/24 hours) patch 21 mg  21 mg Transdermal Daily Kathlen Mody, MD   21 mg at 10/12/16 0941  . nystatin ointment (MYCOSTATIN)   Topical TID Beverley Fiedler, MD   1 application at 10/10/16 2143  . oxyCODONE (Oxy IR/ROXICODONE) immediate release tablet 5 mg  5 mg Oral Q4H PRN Elgergawy, Leana Roe, MD   5 mg at 10/12/16 1306  . pantoprazole (PROTONIX) EC tablet 40 mg  40 mg Oral Daily Hammons, Kimberly B, RPH   40 mg  at 10/12/16 0942  . rifaximin (XIFAXAN) tablet 550 mg  550 mg Oral BID Dianah Field, PA-C   550 mg at 10/12/16 2956  . sodium chloride flush (NS) 0.9 % injection 3 mL  3 mL Intravenous Q12H Coralyn Helling, MD   3 mL at 10/12/16 0948  . sodium chloride flush (NS) 0.9 % injection 3 mL  3 mL Intravenous PRN Coralyn Helling, MD   3 mL at 10/08/16 1223  . spironolactone (ALDACTONE) tablet 200 mg  200 mg Oral Daily Pyrtle, Carie Caddy, MD   200 mg at 10/12/16 0941  . thiamine (VITAMIN B-1) tablet 100 mg  100 mg Oral Daily Dianah Field, PA-C   100 mg at 10/12/16 2130     Discharge Medications: Please see discharge summary for a list of discharge medications.  Relevant Imaging Results:  Relevant Lab Results:   Additional Information SSN: [id number]           Has [employer] insurance.   Dominic Pea, LCSW

## 2016-10-12 NOTE — Progress Notes (Signed)
Nutrition Follow-up  DOCUMENTATION CODES:   Obesity unspecified  INTERVENTION:    Continue Boost Breeze po TID, each supplement provides 250 kcal and 9 grams of protein  NUTRITION DIAGNOSIS:   Increased nutrient needs related to acute illness, chronic illness as evidenced by estimated needs.  Ongoing  GOAL:   Patient will meet greater than or equal to 90% of their needs  Progressing  MONITOR:   PO intake, Supplement acceptance, Labs, Skin, I & O's  ASSESSMENT:   51 year old alcoholic male with cirrhosis who presents with UGI bleed due to varices that was banded.  Now with ileus  Patient reports abdominal pain last night, feeling better this morning. He thinks he has been eating better since diet advanced to soft diet. He likes the [employer] Hann[employer]inBoost Breeze supplements, wild berry flavor. Currently consuming 0-100% of meals, based on abdominal pain Labs reviewed: sodium 130 (L) Medications reviewed and include Lactulose, Thiamine, Folic acid, MVI, Aldactone  Diet Order:  DIET SOFT Room service appropriate? Yes; Fluid consistency: Thin  Skin:  Reviewed, no issues  Last BM:  5/9  Height:   Ht Readings from Last 1 Encounters:  10/07/16 6\' 6"  (1.981 m)    Weight:   Wt Readings from Last 1 Encounters:  10/12/16 239 lb 13.8 oz (108.8 kg)    Ideal Body Weight:  81 kg  BMI:  Body mass index is 27.72 kg/m.  Estimated Nutritional Needs:   Kcal:  2300-2500  Protein:  125-135 gm  Fluid:  1.8-2 L  EDUCATION NEEDS:   No education needs identified at this time  Ray CourtsKimberly Jil Hayden, RD, LDN, CNSC Pager [phone] After Hours Pager [phone]

## 2016-10-12 NOTE — Clinical Social Work Placement (Signed)
   CLINICAL SOCIAL WORK PLACEMENT  NOTE  Date:  10/12/2016  Patient Details  Name: Ray Hayden MRN: [id number] Date of Birth: 12/01/1965  Clinical Social Work is seeking post-discharge placement for this patient at the Skilled  Nursing Facility level of care (*CSW will initial, date and re-position this form in  chart as items are completed):      Patient/family provided with [facility] Clinical [facility] Work Department's list of facilities offering this level of care within the geographic area requested by the patient (or if unable, by the patient's family).      Patient/family informed of their freedom to choose among providers that offer the needed level of care, that participate in Medicare, Medicaid or managed care program needed by the patient, have an available bed and are willing to accept the patient.      Patient/family informed of [name] ownership interest in [facility] and [facility], as well as of the fact that they are under no obligation to receive care at these facilities.  PASRR submitted to EDS on 10/01/16     PASRR number received on 10/01/16     Existing PASRR number confirmed on       FL2 transmitted to all facilities in geographic area requested by pt/family on 10/01/16     FL2 transmitted to all facilities within larger geographic area on       Patient informed that his/her managed care company has contracts with or will negotiate with certain facilities, including the following:        Yes   Patient/family informed of bed offers received.  Patient chooses bed at [name]     Physician recommends and patient chooses bed at      [facility] on 10/12/16.  Patient to be transferred to facility by PTAR     Patient family notified on 10/12/16 of transfer.  Name of family member notified:        PHYSICIAN       Additional Comment:  Pt ready fro d/c and going to [name]. Pt  aware and agreeable to SNF. Confirmed bed with [name] at [facility]. Room and Report in treatment team sticky note. PTAR arranged for 5pm. RN aware. Pt will contact family upon arrival at facility.  CSW signing off as no further needs identified.   _______________________________________________ Dominic PeaJeneya G Catherin Doorn, LCSW 10/12/2016, 3:59 PM

## 2016-10-12 NOTE — Clinical Social Work Note (Addendum)
CSW received call from [place] at [facility] stating initial reference number received  from insurance was incorrect and now has resubmitted auth. [name] cannot accept pt today. RN, pt, and MD aware.  CSW received approval from AD Zack for 5 day LOG. CSW left VM for [facility]. CSW will continue to follow for d/c needs.   Corlis HoveJeneya Santos Sollenberger, LCSWA, LCASA  Clinical Social Work  [phone]

## 2016-10-13 ENCOUNTER — Other Ambulatory Visit: Payer: Self-pay

## 2016-10-13 ENCOUNTER — Non-Acute Institutional Stay (SKILLED_NURSING_FACILITY): Payer: PRIVATE HEALTH INSURANCE | Admitting: Internal Medicine

## 2016-10-13 ENCOUNTER — Encounter: Payer: Self-pay | Admitting: Internal Medicine

## 2016-10-13 DIAGNOSIS — K7031 Alcoholic cirrhosis of liver with ascites: Secondary | ICD-10-CM | POA: Diagnosis not present

## 2016-10-13 DIAGNOSIS — Z9189 Other specified personal risk factors, not elsewhere classified: Secondary | ICD-10-CM | POA: Diagnosis not present

## 2016-10-13 DIAGNOSIS — M79604 Pain in right leg: Secondary | ICD-10-CM | POA: Diagnosis not present

## 2016-10-13 DIAGNOSIS — M79605 Pain in left leg: Secondary | ICD-10-CM

## 2016-10-13 LAB — CBC
HCT: 24.2 % — ABNORMAL LOW (ref 39.0–52.0)
Hemoglobin: 8.4 g/dL — ABNORMAL LOW (ref 13.0–17.0)
MCH: 32.6 pg (ref 26.0–34.0)
MCHC: 34.7 g/dL (ref 30.0–36.0)
MCV: 93.8 fL (ref 78.0–100.0)
Platelets: 137 10*3/uL — ABNORMAL LOW (ref 150–400)
RBC: 2.58 MIL/uL — AB (ref 4.22–5.81)
RDW: 20.4 % — ABNORMAL HIGH (ref 11.5–15.5)
WBC: 24 10*3/uL — AB (ref 4.0–10.5)

## 2016-10-13 MED ORDER — OXYCODONE HCL 5 MG PO TABS: 5.0000 mg | ORAL_TABLET | Freq: Four times a day (QID) | ORAL | 0 refills | Status: DC | PRN

## 2016-10-13 MED ORDER — RIFAXIMIN 550 MG PO TABS: 550.0000 mg | ORAL_TABLET | Freq: Two times a day (BID) | ORAL | 0 refills | Status: AC

## 2016-10-13 MED ORDER — MORPHINE SULFATE (PF) 4 MG/ML IV SOLN
1.0000 mg | INTRAVENOUS | Status: AC
  Administered 2016-10-13: 1 mg via INTRAVENOUS
  Filled 2016-10-13: qty 1

## 2016-10-13 MED ORDER — OXYCODONE HCL 5 MG PO TABS: 5.0000 mg | ORAL_TABLET | Freq: Four times a day (QID) | ORAL | 0 refills | Status: AC | PRN

## 2016-10-13 NOTE — Progress Notes (Signed)
Pt had 1 loose bloody stool tonight.  Has not had bloody BM since 5/5 per chart. BPs stable, HR low 100s. Last hgb 8.3 on 5/8 AM. [facility] on call notified, no new orders given.  Will continue to monitor and update.

## 2016-10-13 NOTE — Progress Notes (Signed)
Report called to [facility] at [facility]. Patient just transferred via PTAR.

## 2016-10-13 NOTE — Clinical Social Work Placement (Signed)
   CLINICAL SOCIAL WORK PLACEMENT  NOTE  Date:  10/13/2016  Patient Details  Name: Ray Hayden MRN: [id number] Date of Birth: 05/02/1966  Clinical Social Work is seeking post-discharge placement for this patient at the Skilled  Nursing Facility level of care (*CSW will initial, date and re-position this form in  chart as items are completed):      Patient/family provided with [facility] Clinical Social Work Department's list of facilities offering this level of care within the geographic area requested by the patient (or if unable, by the patient's family).      Patient/family informed of their freedom to choose among providers that offer the needed level of care, that participate in Medicare, Medicaid or managed care program needed by the patient, have an available bed and are willing to accept the patient.      Patient/family informed of [facility]'s ownership interest in [facility] and [facility], as well as of the fact that they are under no obligation to receive care at these facilities.  PASRR submitted to EDS on 10/01/16     PASRR number received on 10/01/16     Existing PASRR number confirmed on       FL2 transmitted to all facilities in geographic area requested by pt/family on 10/01/16     FL2 transmitted to all facilities within larger geographic area on       Patient informed that his/her managed care company has contracts with or will negotiate with certain facilities, including the following:        Yes   Patient/family informed of bed offers received.  Patient [facility]     Physician recommends and patient chooses bed at      Patient to be [facility] on 10/12/16.  Patient to be transferred to facility by [facility]     Patient family notified on 10/13/16 of transfer.  Name of family member notified:  [name]     PHYSICIAN       Additional Comment:     _______________________________________________ Margarito LinerSarah C Hydie Langan, LCSW 10/13/2016, 2:42 PM

## 2016-10-13 NOTE — Progress Notes (Signed)
This is a comprehensive admission note to [facility] performed on this date less than 30 days from date of admission. Included are preadmission medical/surgical history;reconciled medication list; family history; social history and comprehensive review of systems.  Corrections and additions to the records were documented . Comprehensive physical exam was also performed. Additionally a clinical summary was entered for each active diagnosis pertinent to this admission in the Problem List to enhance continuity of care.  PCP: Marcelo Baldy, M.D.  HPI: The patient was hospitalized 4/18-5/10/18 with an upper GI bleed due to varices in the context of alcohol abuse and cirrhosis. The patient was intubated for alcohol withdrawal; acute respiratory failure possibly from aspiration pneumonia and to allow performance of EGD. EGD 4/18 revealed grade 3 bleeding esophageal varices, banding 4 was completed. He was extubated on 4/24 and Precedex sedation  discontinued 4/25. He was transferred to [facility] on 4/26. At [facility]  the patient had mental status changes with confusion and encephalopathic behavior. MRI suggested Wernicke's encephalopathy. Palliative care was consulted for adult failure to thrive. On 5/4 the patient developed recurrent multiple bloody stools with associated acute blood loss anemia. He did remain hemodynamically stable and was transferred to the stepdown unit. He received packed red cells as well as fresh frozen plasma. EGD with variceal banding for the second time and flexible sigmoidoscopy were performed 5/5. Sigmoidoscopy revealed edematous colon. Nonbleeding internal hemorrhoids were present. GI bleed appeared to have abated. 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 Interventional radiology removed 2.1 liters paracentesis on 4/26. He developed small bowel ileus. Lactulose and rifaximin were continued. Diuretics were increased to furosemide  80 mg daily and Aldactone 200 mg daily. A second paracentesis was performed 5/8 with removal of almost 6 L of fluid. Subjectively the patient was markedly improved. Outpatient follow-up will be with Dr. Margaretha Glassing, [facility].  Past medical and surgical history: Includes hypertension, hepatic related coagulopathy, and anxiety. Tubular adenomatous polyp was found 07/20/16. He states he was diagnosed with sleep apnea in 2013, he was noncompliant with CPAP due to "apathy"  Social history: He states that he was smoking 2 cigarettes a day prior to admission. He smoked from age 85 on up to 1 pack per day. He smoked for a total of 30 years.He used to smoke cannibis. He says he was drinking a fifth of liquor a day or to admission.  Family history: He denies a family history of heart attack, stroke, cancer, or diabetes.  Review of systems: He was able to name the president but stated the date was Friday, April?, 2018. His major complaint is diffuse abdominal pain which he describes as "acute". He does not qualify it otherwise. He also has pain in the lower legs from the calves to his feet. He was having epistaxis approximately 2 times a week prior to admission. He describes seasonal allergic symptoms.Also prior to admission he had hematuria. He describes anxiety and depression ongoing. He has blurred vision.  Constitutional: No fever  Eyes: No redness, discharge, pain ENT/mouth: No nasal congestion,  purulent discharge, earache,change in hearing ,sore throat  Cardiovascular: No chest pain, palpitations,paroxysmal nocturnal dyspnea, claudication, edema  Respiratory: No cough, sputum production,hemoptysis, DOE  Gastrointestinal: No heartburn,dysphagia, nausea / vomiting,rectal bleeding, melena,change in bowels Genitourinary: No dysuria, pyuria,  incontinence, nocturia Musculoskeletal: No joint stiffness, joint swelling, weakness Dermatologic: No rash, pruritus, change in appearance of skin Neurologic: No  dizziness,headache,syncope, seizures, numbness , tingling Endocrine: No change in hair/skin/ nails,  excessive thirst, excessive hunger, excessive urination   Physical exam:  Pertinent or positive findings: hair is disheveled, he has a beard and mustache. He is tremulous. Slight icterus is suggested. First heart sound is increased. He has inspiratory rales-like sounds to his respirations. His abdomen is massively distended resembling end of term pregnancy. He has 1+ pitting edema to the knees. Reflexes are 1.5+ in the upper extremities   General appearance: no acute distress , increased work of breathing is present.   Lymphatic: No lymphadenopathy about the head, neck, axilla . Eyes: No conjunctival inflammation or lid edema is present.  Ears:  External ear exam shows no significant lesions or deformities.   Nose:  External nasal examination shows no deformity or inflammation. Nasal mucosa are pink and moist without lesions ,exudates Oral exam: lips and gums are healthy appearing.There is no oropharyngeal erythema or exudate . Neck:  No thyromegaly, masses, tenderness noted.    Heart:  No gallop, murmur, click, rub .  GU: deferred  Extremities:  No cyanosis, clubbing Neurologic exam : Strength equal  in upper & lower extremities but decreased Balance,Rhomberg,finger to nose testing could not be completed due to clinical state Skin: Warm & dry w/o tenting. No significant lesions or rash.  See clinical summary under each active problem in the Problem List with associated updated therapeutic plan

## 2016-10-13 NOTE — Assessment & Plan Note (Signed)
I explained  risk of over 50 % of adverse reaction or possibly death if opioids are continued long-term.

## 2016-10-13 NOTE — Clinical Social Work Note (Signed)
CSW facilitated patient discharge including contacting patient family and facility to confirm patient discharge plans. Clinical information faxed to facility and family agreeable with plan. CSW arranged ambulance transport via [facility] to [name]. RN to call report prior to discharge ([phone]).  CSW will sign off for now as social work intervention is no longer needed. Please consult us again if new needs arise.  Flecia Shutter, CSW [phone]   

## 2016-10-13 NOTE — Clinical Social Work Note (Addendum)
[facility] from [facility]. There is concern with the cost of patient's Rifaximin. It will cost $1000 for a two-week supply. Per the insurance rep, since patient is an employee at [facility], he can get this medication at that pharmacy for a $60 copay. The insurance rep will call the patient to get consent to call his sister with the 336 area code to see if she can pick this medication up for him.  Ray CourtSarah Kessa Hayden, CSW [phone]  10:57 am Received call from [place] with [employer]. She has spoken with the patient's girlfriend and she is agreeable to picking up the medication. Ray AbedLindsay asked that the MD send the prescription to [facility]. CSW paged MD and asked that he call back. Girlfriend needs to know when it is done so she can go pick up the prescription. Patient does have authorization to go to [facility] today.  Ray CourtSarah Heena Woodbury, CSW [phone]  12:01 pm CSW faxed prescription to [facility]. Received a "Successful fax notice." CSW has attempted twice to verify by phone that they received it and to find out how long it will take to fill. CSW left voicemail for patient's brother. CSW spoke with patient's girlfriend, Ray ElmSylvia [phone]) and updated her on status of prescription.  Ray CourtSarah Ray Hayden, CSW [phone]  2:12 pm [facility] received the prescription and stated it would be filled around 2:40 pm today. CSW updated patient's girlfriend who will pick the medication up. CSW updated patient's brother. CSW was about to call transport but the facility stated they had not yet received the authorization number. CSW was told by the insurance company this morning that he was approved. CSW left voicemail for Ray [facility]bedLindsay at [facility]. [facility] admissions coordinator working on that as well.  Ray CourtSarah Ray Hayden, CSW [phone]  2:43 pm Insurance authorization confirmed with Ray AbedLindsay. SNF aware.  Ray CourtSarah Ray Hayden,  CSW [phone]

## 2016-10-13 NOTE — Assessment & Plan Note (Addendum)
Massive abdominal distention most likely related to recurrent ascites Pain secondary to above but high risk recurrent ileus High risk of obstructive symptoms with opioids discussed Discuss generic Bentyl trial  with GI Trial of Fentanyl patch

## 2016-10-13 NOTE — Progress Notes (Signed)
Patient was seen and examined at bedside. He remains clinically stable, alert awake and good mental status.  Today's CBC's showed stable Hb and elevated wbc of 24 which is likely reactive. His abdomen pain is stable and actually better after paracentesis two days ago.  He is able to tolerate diet. I again emphasized him to follow-up with his gastroenterologist at [facility] to discuss about further plan including evaluation for possible frequent outpatient paracentesis. He has no fever, chills, nausea, vomiting, chest pain or shortness of breath. His discharge planning and orders were done yesterday. Patient is clinically stable today and requires close follow-up at outpatient to continue his care.  Heart rate 103, blood pressure 99/66 Not in distress, alert awake and pleasant Lungs clear bilateral Abdomen soft, nontender, distended which is stable, bowel sound positive Bilateral lower extremity edema.

## 2016-10-13 NOTE — Patient Instructions (Signed)
See assessment and plan under each diagnosis in the problem list and acutely for this visit 

## 2016-10-13 NOTE — Telephone Encounter (Signed)
Faxed to [facility] Fax Number: [phone], Phone Number [phone]  

## 2016-10-13 NOTE — Assessment & Plan Note (Addendum)
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

## 2016-10-17 ENCOUNTER — Telehealth: Payer: Self-pay

## 2016-10-17 NOTE — Telephone Encounter (Signed)
This is a patient of [facility], who was [facility] is needed. Hospital [facility] 10[facility]/16

## 2016-10-20 ENCOUNTER — Non-Acute Institutional Stay (SKILLED_NURSING_FACILITY): Payer: PRIVATE HEALTH INSURANCE | Admitting: Internal Medicine

## 2016-10-20 ENCOUNTER — Encounter: Payer: Self-pay | Admitting: Internal Medicine

## 2016-10-20 ENCOUNTER — Encounter: Payer: Self-pay | Admitting: *Deleted

## 2016-10-20 ENCOUNTER — Other Ambulatory Visit: Payer: Self-pay

## 2016-10-20 DIAGNOSIS — K7031 Alcoholic cirrhosis of liver with ascites: Secondary | ICD-10-CM | POA: Diagnosis not present

## 2016-10-20 DIAGNOSIS — Z7189 Other specified counseling: Secondary | ICD-10-CM

## 2016-10-20 LAB — BASIC METABOLIC PANEL
BUN: 9 mg/dL (ref 4–21)
Creatinine: 0.4 mg/dL — AB (ref 0.6–1.3)
GLUCOSE: 87 mg/dL
Potassium: 3.5 mmol/L (ref 3.4–5.3)
SODIUM: 131 mmol/L — AB (ref 137–147)

## 2016-10-20 LAB — CBC AND DIFFERENTIAL
HEMATOCRIT: 27 % — AB (ref 41–53)
Hemoglobin: 9.2 g/dL — AB (ref 13.5–17.5)
PLATELETS: 154 10*3/uL (ref 150–399)
WBC: 11.7 10*3/mL

## 2016-10-20 MED ORDER — FENTANYL 12 MCG/HR TD PT72: MEDICATED_PATCH | TRANSDERMAL | 0 refills | Status: AC

## 2016-10-20 NOTE — Telephone Encounter (Signed)
Refill request from [facility]

## 2016-10-20 NOTE — Assessment & Plan Note (Signed)
Paracentesis will be requested Sodium restriction Nutrition consult requested Meds will be weaned based on the response of the ascites and the pending labs

## 2016-10-20 NOTE — Progress Notes (Signed)
   [name] Room: 119A  PCP: Marcelo BaldyAlexander, Megan, MD [phone] [place] [id number]   This is a nursing facility follow up for specific acute issue of alcohol abuse related end-stage cirrhotic liver disease with esophageal varices and ascites. Additionally his family is here requesting review of his condition & prognosis.  Interim medical record and care since [facility] visit was updated with review of diagnostic studies and change in clinical status since last visit were documented.  HPI: Aggressive intervention for complications of the cirrhosis included esophageal variceal banding 2 and paracentesis 2. The second paracentesis 5/8 resulted in removal of almost 6 L of fluid. There've been only mild elevation of the AST and this was trending down. His anemia has been stable when last checked 1 week ago. Repeat labs are pending. His family was informed of status to date. Repeat paracentesis recommended due to ongoing pain. Physiologically appropriate pain treatment & opiod associated risks were discussed. Many of their questions related to nutrition once he is discharged. It is noted that he had salted peanut butter crackers @ his bedside.Nutrition consult recommended.  Review of systems: He continues to have pain across the abdomen which is "24/ 7". When he takes pain medicine he will have relief for up to an hour. He also describes watery stools with exacerbation of his hemorrhoids. Additionally he's had pain in the ankles in context of edema.  Constitutional: No fever, fatigue improved Cardiovascular: No chest pain, palpitations,paroxysmal nocturnal dyspnea, claudication, edema  Respiratory: No cough, sputum production,hemoptysis, DOE , significant snoring,apnea  Gastrointestinal: No heartburn,dysphagia,vomiting,rectal bleeding, melena Genitourinary: No dysuria,hematuria, pyuria,  incontinence, nocturia Dermatologic: No rash, pruritus,  change in appearance of skin Neurologic: No dizziness,headache,syncope, seizures Hematologic/lymphatic: No significant bruising, lymphadenopathy,abnormal bleeding  Physical exam:  Pertinent or positive findings:He appears cachectic. He has a full beard and mustache. Gallop cadence is noted with an increased first heart sound. Breath sounds are decreased, especially at the bases. Abdomen is massive and tight. There is edema up to the knees with 3+ edema over the feet. Pedal pulses were not palpable.   General appearance:no acute distress , increased work of breathing is present.   Lymphatic: No lymphadenopathy about the head, neck, axilla . Eyes: No conjunctival inflammation or lid edema is present. There is no scleral icterus. Ears:  External ear exam shows no significant lesions or deformities.   Nose:  External nasal examination shows no deformity or inflammation. Nasal mucosa are pink and moist without lesions ,exudates Oral exam: lips and gums are healthy appearing.There is no oropharyngeal erythema or exudate . Neck:  No thyromegaly, masses, tenderness noted.    Heart:  No murmur, click, rub .  Lungs: without wheezes, rhonchi,rales , rubs. GU: deferred  Extremities:  No cyanosis, clubbing Skin: Warm & dry w/o tenting. No significant lesions or rash.  See summary under each active problem in the Problem List with associated updated therapeutic plan

## 2016-10-20 NOTE — Patient Instructions (Addendum)
See assessment and plan under each diagnosis in the problem list and acutely for this visit Total time 42 minutes; greater than 50% of the visit spent counseling patient & family and coordinating care for problems addressed at this encounter

## 2016-10-20 NOTE — Assessment & Plan Note (Signed)
10/20/16 family conference with his brother and sister-in-law Brother plans to move in with him post discharge Patient plans to enter [facility]

## 2016-10-21 ENCOUNTER — Other Ambulatory Visit (HOSPITAL_COMMUNITY): Payer: Self-pay | Admitting: Internal Medicine

## 2016-10-21 DIAGNOSIS — R188 Other ascites: Secondary | ICD-10-CM

## 2016-10-22 LAB — BASIC METABOLIC PANEL
BUN: 10 mg/dL (ref 4–21)
Creatinine: 0.4 mg/dL — AB (ref 0.6–1.3)
Glucose: 107 mg/dL
Potassium: 3.7 mmol/L (ref 3.4–5.3)
SODIUM: 131 mmol/L — AB (ref 137–147)

## 2016-10-22 LAB — CBC AND DIFFERENTIAL
HCT: 28 % — AB (ref 41–53)
HEMOGLOBIN: 9.4 g/dL — AB (ref 13.5–17.5)
Platelets: 159 10*3/uL (ref 150–399)
WBC: 13.4 10^3/mL

## 2016-10-24 ENCOUNTER — Emergency Department (HOSPITAL_COMMUNITY)
Admission: EM | Admit: 2016-10-24 | Discharge: 2016-10-25 | Disposition: A | Discharge status: Home / Self Care | Payer: PRIVATE HEALTH INSURANCE | Attending: Emergency Medicine | Admitting: Emergency Medicine

## 2016-10-24 ENCOUNTER — Emergency Department (HOSPITAL_COMMUNITY): Payer: PRIVATE HEALTH INSURANCE

## 2016-10-24 DIAGNOSIS — R0602 Shortness of breath: Secondary | ICD-10-CM | POA: Insufficient documentation

## 2016-10-24 DIAGNOSIS — Z79899 Other long term (current) drug therapy: Secondary | ICD-10-CM | POA: Diagnosis not present

## 2016-10-24 DIAGNOSIS — R188 Other ascites: Secondary | ICD-10-CM | POA: Insufficient documentation

## 2016-10-24 DIAGNOSIS — R140 Abdominal distension (gaseous): Secondary | ICD-10-CM | POA: Diagnosis not present

## 2016-10-24 DIAGNOSIS — Z87891 Personal history of nicotine dependence: Secondary | ICD-10-CM | POA: Diagnosis not present

## 2016-10-24 DIAGNOSIS — R1084 Generalized abdominal pain: Secondary | ICD-10-CM

## 2016-10-24 DIAGNOSIS — R109 Unspecified abdominal pain: Secondary | ICD-10-CM | POA: Diagnosis present

## 2016-10-24 DIAGNOSIS — I10 Essential (primary) hypertension: Secondary | ICD-10-CM | POA: Diagnosis not present

## 2016-10-24 LAB — LIPASE, BLOOD: LIPASE: 66 U/L — AB (ref 11–51)

## 2016-10-24 LAB — CBC WITH DIFFERENTIAL/PLATELET
BASOS PCT: 0 %
Band Neutrophils: 0 %
Basophils Absolute: 0 10*3/uL (ref 0.0–0.1)
Blasts: 0 %
EOS PCT: 2 %
Eosinophils Absolute: 0.3 10*3/uL (ref 0.0–0.7)
HEMATOCRIT: 23 % — AB (ref 39.0–52.0)
Hemoglobin: 8 g/dL — ABNORMAL LOW (ref 13.0–17.0)
LYMPHS ABS: 0.8 10*3/uL (ref 0.7–4.0)
LYMPHS PCT: 5 %
MCH: 32.9 pg (ref 26.0–34.0)
MCHC: 34.8 g/dL (ref 30.0–36.0)
MCV: 94.7 fL (ref 78.0–100.0)
MONO ABS: 0.5 10*3/uL (ref 0.1–1.0)
MONOS PCT: 3 %
Metamyelocytes Relative: 0 %
Myelocytes: 0 %
NEUTROS PCT: 90 %
NRBC: 0 /100{WBCs}
Neutro Abs: 13.7 10*3/uL — ABNORMAL HIGH (ref 1.7–7.7)
OTHER: 0 %
PLATELETS: 124 10*3/uL — AB (ref 150–400)
Promyelocytes Absolute: 0 %
RBC: 2.43 MIL/uL — AB (ref 4.22–5.81)
RDW: 19.1 % — AB (ref 11.5–15.5)
WBC: 15.3 10*3/uL — AB (ref 4.0–10.5)

## 2016-10-24 LAB — AMYLASE, PLEURAL OR PERITONEAL FLUID: Amylase, Fluid: <5 U/L

## 2016-10-24 LAB — BODY FLUID CELL COUNT WITH DIFFERENTIAL
Lymphs, Fluid: 44 %
Monocyte-Macrophage-Serous Fluid: 47 % — ABNORMAL LOW (ref 50–90)
Neutrophil Count, Fluid: 9 % (ref 0–25)
Total Nucleated Cell Count, Fluid: 159 cu mm (ref 0–1000)

## 2016-10-24 LAB — COMPREHENSIVE METABOLIC PANEL
ALT: 32 U/L (ref 17–63)
AST: 59 U/L — AB (ref 15–41)
Albumin: 1.8 g/dL — ABNORMAL LOW (ref 3.5–5.0)
Alkaline Phosphatase: 82 U/L (ref 38–126)
Anion gap: 7 (ref 5–15)
BUN: 9 mg/dL (ref 6–20)
CHLORIDE: 95 mmol/L — AB (ref 101–111)
CO2: 23 mmol/L (ref 22–32)
CREATININE: 0.78 mg/dL (ref 0.61–1.24)
Calcium: 7.5 mg/dL — ABNORMAL LOW (ref 8.9–10.3)
Glucose, Bld: 106 mg/dL — ABNORMAL HIGH (ref 65–99)
POTASSIUM: 3.2 mmol/L — AB (ref 3.5–5.1)
Sodium: 125 mmol/L — ABNORMAL LOW (ref 135–145)
TOTAL PROTEIN: 6.1 g/dL — AB (ref 6.5–8.1)
Total Bilirubin: 5.5 mg/dL — ABNORMAL HIGH (ref 0.3–1.2)

## 2016-10-24 LAB — I-STAT TROPONIN, ED: TROPONIN I, POC: 0.01 ng/mL (ref 0.00–0.08)

## 2016-10-24 LAB — LACTATE DEHYDROGENASE, PLEURAL OR PERITONEAL FLUID: LD FL: 21 U/L (ref 3–23)

## 2016-10-24 LAB — URINALYSIS, ROUTINE W REFLEX MICROSCOPIC
Bilirubin Urine: NEGATIVE
Glucose, UA: NEGATIVE mg/dL
Hgb urine dipstick: NEGATIVE
KETONES UR: NEGATIVE mg/dL
LEUKOCYTES UA: NEGATIVE
Nitrite: NEGATIVE
PH: 6 (ref 5.0–8.0)
PROTEIN: NEGATIVE mg/dL
Specific Gravity, Urine: 1.013 (ref 1.005–1.030)

## 2016-10-24 LAB — GLUCOSE, PLEURAL OR PERITONEAL FLUID: GLUCOSE FL: 108 mg/dL

## 2016-10-24 LAB — ALBUMIN, PLEURAL OR PERITONEAL FLUID: Albumin, Fluid: <1 g/dL

## 2016-10-24 LAB — TYPE AND SCREEN
ABO/RH(D): AB POS
Antibody Screen: NEGATIVE

## 2016-10-24 LAB — BRAIN NATRIURETIC PEPTIDE: B NATRIURETIC PEPTIDE 5: 33.1 pg/mL (ref 0.0–100.0)

## 2016-10-24 LAB — PROTEIN, PLEURAL OR PERITONEAL FLUID

## 2016-10-24 NOTE — ED Notes (Signed)
Dr. Smith at the bedside.  

## 2016-10-24 NOTE — ED Provider Notes (Signed)
3:56 PM Care assumed from Dr. Anitra LauthPlunkett. At time of transfer of care, patient is awaiting results of paracentesis. Patient has a history of cirrhosis, esophageal varices, and ascites. Patient came for further evaluation of abdominal swelling and pain. Patient denied fevers or chills by report but does have some shortness of breath.  At time of transfer care, patient is undergoing paracentesis. Patient will have fluid sent for evaluation. Patient was found to have leukocytosis. Patient is also a urinalysis.  Anticipate reassessing patient after blood work has fully returned. Previous team felt patient may be stable for discharge if labs were reassuring and there was no concern of SBP on paracentesis fluid.  [name] paracentesis was stopped after 4 L withdrawn. Patient had continued oozing of fluid from the site after Band-Aid. Patient then had Dermabond applied which stopped the fluid drainage.   [name] laboratory testing returned that did not convincingly show evidence of SBP. Urinalysis did not show UTI. Patient felt much better after fluid was drained. Patient did have a leukocytosis however, it appears that he has had this in the past. Hemoglobin is low but is similar to recent levels. Given patient's improvement in symptoms and resolution of shortness of breath, feel patient is stable for discharge given reassuring workup. Patient understood strict return precautions for any new or worsened symptoms. Patient will follow-up with his PCP in several days. Patient had no other questions or concerns and patient was discharged in good condition.       Clinical Impression: 1. Generalized abdominal pain   2. Bloating   3. Other ascites     Disposition: Discharge  Condition: Good  I have discussed the results, Dx and Tx plan with the pt(& family if present). He/she/they expressed understanding and agree(s) with the plan. Discharge instructions discussed at great length. Strict return  precautions discussed and pt &/or family have verbalized understanding of the instructions. No further questions at time of discharge.    New Prescriptions   No medications on file    Follow Up: Marcelo BaldyAlexander, Megan, MD [id number] [place] [id number] [phone]  Schedule an appointment as soon as possible for a visit    [facility] [address] [id number] [place]c [place] [id number] [phone]  If symptoms worsen       Mohd. Derflinger, Canary Brimhristopher J, MD 10/25/16 [phone]

## 2016-10-24 NOTE — ED Notes (Signed)
Pt informed of need for urine sample. Pt stated he would try but it would be very difficult. Urinal left at bedside.

## 2016-10-24 NOTE — ED Provider Notes (Signed)
[facility]-EM[facility]ENCY DEPT Provider Note   CSN: [id number] Arrival date & time: 10/24/16  1233     History   Chief Complaint Chief Complaint  Patient presents with  . Bloated    HPI Ray Hayden is a 51 y.o. male.  Patient is a 51 year old male with multiple medical problems including alcoholic cirrhosis, liver disease, GI bleeding, recent admission for variceal bleeding and anemia who was discharged several weeks ago from the hospital currently living at rehabilitation and recent diagnosis of C. difficile last week resulting in antibiotic therapy with improvement in symptoms who presents today due to worsening ascites, abdominal pain and shortness of breath.  Patient denies cough, fever. Has not had a bowel movement in several days. Also complains of urinating less despite taking Lasix and spironolactone. Last paracentesis was done on 10/10/2016 where he had 6 L removed.  Based on nursing facility notes patient's ascites continues to worsen with more discomfort and was sent here for paracentesis. He denies any blood in his stool and no blood or vomit. He does have nausea every time he eats and has had decreased by mouth intake.   The history is provided by the patient.    Past Medical History:  Diagnosis Date  . Adynamic ileus (HCC)    small bowel >> colon.  no obstructive sxs (no n/v or cessation of bowel movements)  . Anemia 04/2016   due to GI bleed and suspect chronic disease. required PRBC transfusions in 04/2016 and 09/2015.  Marland Kitchen Anxiety 2016  . Cirrhosis (HCC) 2017   due to ETOH and ? contribution of NAFLD.    . Coagulopathy (HCC) 04/2016   due to liver disease. did not respond to Vitamin K.  required FFP x 3 units in 09/2016.   . Encephalopathy 04/2016   hepatic and toxic metabolic encephalopathy.  lactulose initiated 04/2016, Rifaximin added 09/2016.   Marland Kitchen Hypertension   . Sinus tachycardia 09/2016    Patient Active Problem List   Diagnosis Date Noted  . Bilateral leg pain  10/13/2016  . At risk for adverse drug event 10/13/2016  . Ileus (HCC)   . History of esophageal varices with bleeding   . Rectal bleeding   . Goals of care, counseling/discussion   . Palliative care encounter   . Abdominal distention   . Respiratory failure (HCC)   . Encephalopathy, hepatic (HCC)   . LFTs abnormal   . Upper GI bleed 09/21/2016  . Hematemesis   . Acute blood loss anemia   . Alcoholic cirrhosis of liver with ascites (HCC)   . Ascites due to alcoholic cirrhosis (HCC)   . Coagulopathy (HCC)   . Hypovolemic shock (HCC)   . Lactic acidosis   . Bleeding esophageal varices (HCC)     Past Surgical History:  Procedure Laterality Date  . COLONOSCOPY W/ POLYPECTOMY  07/20/2016   tubular adenomatous polyp on path report from [facility].   . ESOPHAGOGASTRODUODENOSCOPY N/A 09/21/2016   Charlie Pitter III, MD; [facility] ENDOSCOPY: grade 3, bleeding esophageal varices, s/p banding x 4.   . ESOPHAGOGASTRODUODENOSCOPY  04/16/2016, 07/19/16   04/2016 for hematemesis at [facility] hospital: medium esoph varices, banded x 6, erosive esophagitis. repeated 07/2015, report not found.  . ESOPHAGOGASTRODUODENOSCOPY N/A 10/08/2016   Procedure: ESOPHAGOGASTRODUODENOSCOPY (EGD);  Surgeon: Beverley Fiedler, MD;  Location: [facility] ENDOSCOPY;  Service: Gastroenterology;  Laterality: N/A;  . FLEXIBLE SIGMOIDOSCOPY N/A 10/08/2016   Procedure: FLEXIBLE SIGMOIDOSCOPY;  Surgeon: Beverley Fiedler, MD;  Location: [facility] ENDOSCOPY;  Service:  Gastroenterology;  Laterality: N/A;  . IR PARACENTESIS  09/29/2016   [facility]: Moderate volume of serous ascites. 09/29/16 at [facility]: 2.1 liters (challenging to obtain this quantity of fluid, no SBP.  10/05/16: not enough fluid to tap  . IR PARACENTESIS  10/11/2016  . NASAL SEPTUM SURGERY  05/2010       Home Medications    Prior to Admission medications   Medication Sig Start Date End Date Taking? Authorizing Provider  busPIRone (BUSPAR) 15 MG tablet Take 15 mg by mouth daily.      [provider]  dicyclomine (BENTYL) 10 MG capsule Take one tablet by mouth three times daily as needed for pain    [provider]  feeding supplement (BOOST / RESOURCE BREEZE) LIQD Take 1 Container by mouth 3 (three) times daily between meals. 10/12/16   Maxie BarbBhandari, Dron Prasad, MD  fentaNYL (DURAGESIC - DOSED MCG/HR) 12 MCG/HR Apply one patch topically every 72 hours, remove old patch 10/20/16   Sharon SellerEubanks, Jessica K, NP  furosemide (LASIX) 40 MG tablet Take 2 tablets (80 mg total) by mouth daily. 10/12/16   Maxie BarbBhandari, Dron Prasad, MD  gabapentin (NEURONTIN) 300 MG capsule Take 300 mg by mouth 3 (three) times daily.    [provider]  hydrocortisone (ANUSOL-HC) 25 MG suppository Place 1 suppository (25 mg total) rectally 2 (two) times daily. 10/12/16   Maxie BarbBhandari, Dron Prasad, MD  lactulose (CHRONULAC) 10 GM/15ML solution Take 30 mLs (20 g total) by mouth 3 (three) times daily. 10/12/16   Maxie BarbBhandari, Dron Prasad, MD  magnesium oxide (MAG-OX) 400 MG tablet Take 400 mg by mouth 2 (two) times daily.    [provider]  Melatonin 3 MG TABS Take 1 tablet by mouth at bedtime.    [provider]  Multiple Vitamin (MULTIVITAMIN WITH MINERALS) TABS tablet Take 1 tablet by mouth daily.    [provider]  oxyCODONE (OXY IR/ROXICODONE) 5 MG immediate release tablet Take 1 tablet (5 mg total) by mouth every 6 (six) hours as needed for severe pain. 10/13/16   Maxie BarbBhandari, Dron Prasad, MD  pantoprazole (PROTONIX) 40 MG tablet Take 40 mg by mouth 2 (two) times daily.     [provider]  rifaximin (XIFAXAN) 550 MG TABS tablet Take 1 tablet (550 mg total) by mouth 2 (two) times daily. 10/13/16   Maxie BarbBhandari, Dron Prasad, MD  spironolactone (ALDACTONE) 100 MG tablet Take 2 tablets (200 mg total) by mouth daily. 10/12/16   Maxie BarbBhandari, Dron Prasad, MD  thiamine (VITAMIN B-1) 100 MG tablet Take 100 mg by mouth daily.    [provider]  venlafaxine XR (EFFEXOR-XR) 150 MG  24 hr capsule Take 150 mg by mouth daily with breakfast.    [provider]    Family History No family history on file.  Social History Social History  Substance Use Topics  . Smoking status: Former Smoker    Types: Cigarettes  . Smokeless tobacco: Never Used     Comment: [facility]  . Alcohol use Yes     Allergies   Sulfamethoxazole; Sulfamethoxazole-trimethoprim; Amoxicillin-pot clavulanate; Pollen extract; and Sulfa antibiotics   Review of Systems Review of Systems  All other systems reviewed and are negative.    Physical Exam Updated Vital Signs BP 96/75   Pulse (!) 111   Temp 98.8 F (37.1 C) (Oral)   Resp (!) 27   SpO2 94%   Physical Exam  Constitutional: He is oriented to person, place,  and time. He appears well-developed. He appears cachectic. No distress.  HENT:  Head: Normocephalic and atraumatic.  Mouth/Throat: Oropharynx is clear and moist.  Spider angiomas across the face and abdomen  Eyes: Conjunctivae and EOM are normal. Pupils are equal, round, and reactive to light. Scleral icterus is present.  Neck: Normal range of motion. Neck supple.  Cardiovascular: Normal rate, regular rhythm and intact distal pulses.   No murmur heard. Pulmonary/Chest: Effort normal. No respiratory distress. He has decreased breath sounds in the right lower field and the left lower field. He has no wheezes. He has no rales.  Abdominal: Soft. He exhibits distension. There is tenderness. There is no rebound and no guarding.  Severe ascites with diffuse tenderness throughout the abdomen without guarding or rebound  Musculoskeletal: Normal range of motion. He exhibits edema. He exhibits no tenderness.  3+ pitting edema bilaterally up to the knee  Neurological: He is alert and oriented to person, place, and time.  Skin: Skin is warm and dry. Capillary refill takes 2 to 3 seconds. No rash noted. No erythema. There is pallor.  Psychiatric: He has a  normal mood and affect. His behavior is normal.  Nursing note and vitals reviewed.    ED Treatments / Results  Labs (all labs ordered are listed, but only abnormal results are displayed) Labs Reviewed  CBC WITH DIFFERENTIAL/PLATELET - Abnormal; Notable for the following:       Result Value   WBC 15.3 (*)    RBC 2.43 (*)    Hemoglobin 8.0 (*)    HCT 23.0 (*)    RDW 19.1 (*)    Platelets 124 (*)    All other components within normal limits  BODY FLUID CULTURE  COMPREHENSIVE METABOLIC PANEL  LIPASE, BLOOD  URINALYSIS, ROUTINE W REFLEX MICROSCOPIC  BRAIN NATRIURETIC PEPTIDE  BODY FLUID CELL COUNT WITH DIFFERENTIAL  GLUCOSE, PLEURAL OR PERITONEAL FLUID  PROTEIN, PLEURAL OR PERITONEAL FLUID  LACTATE DEHYDROGENASE, PLEURAL OR PERITONEAL FLUID  AMYLASE, PLEURAL OR PERITONEAL FLUID   ALBUMIN, PLEURAL OR PERITONEAL FLUID  I-STAT TROPOININ, ED  TYPE AND SCREEN    EKG  EKG Interpretation None       Radiology Dg Chest Port 1 View  Result Date: 10/24/2016 CLINICAL DATA:  51 y/o  M; shortness of breath. EXAM: PORTABLE CHEST 1 VIEW COMPARISON:  10/05/2016 chest radiograph. FINDINGS: Stable normal cardiac silhouette given projection and technique. Low lung volumes accentuate pulmonary markings. Streaky opacities at lung bases probably represent atelectasis. No pleural effusion or pneumothorax. Bones are unremarkable. IMPRESSION: Low lung volumes.  Minor bibasilar atelectasis. Electronically Signed   By: Mitzi Hansen M.D.   On: 10/24/2016 13:59    Procedures Procedures (including critical care time)  Medications Ordered in ED Medications - No data to display   Initial Impression / Assessment and Plan / ED Course  I have reviewed the triage vital signs and the nursing notes.  Pertinent labs & imaging results that were available during my care of the patient were reviewed by me and considered in my medical decision making (see chart for details).      Patient with multiple medical problems presenting today with severe ascites, abdominal discomfort and decreased by mouth intake. Patient recently treated for C. difficile and states no diarrhea for multiple days now after antibiotics. He denies any chest pain but has been short of breath with increased swelling of his abdomen. He is taking all meds including spironolactone and 80 mg of Lasix twice a day.  On exam patient has abdominal distention with ascites and diffuse tenderness.  \ Labs with leukocytosis of 15,000 and hemoglobin of 8. Hemoglobin seems to be trending around where his baseline is. Platelet count 124 with a normal troponin. Will do paracentesis and check fluid to ensure no SBP. Chest x-ray without evidence of pleural effusion. CMP pending  Paracentesis done with return of clear yellow fluid.  4L removed.  Pt checked out to Dr. Julieanne Manson  Final Clinical Impressions(s) / ED Diagnoses   Final diagnoses:  None    New Prescriptions New Prescriptions   No medications on file     Gwyneth Sprout, MD 10/26/16 [phone]

## 2016-10-24 NOTE — ED Triage Notes (Addendum)
Pt arrived via [facility] from [facility] with c/c of abdominal distention x1 week. Pt was seen by his PCP a week ago and scheduled a paracentesis for Wednesday, this week. Pt's abdomen is greatly distended, and pt states distention has gotten worse since pcp appt. Pt is c/o n/v. Pt was recently hospitalized, has hx of liver cirrhosis, and has previously received a blood transfusion. Pt is alert and oriented x4. Pt is positive for C-Diff and is currently taking abx.

## 2016-10-24 NOTE — ED Notes (Signed)
ED Provider at bedside. 

## 2016-10-25 LAB — PATHOLOGIST SMEAR REVIEW

## 2016-10-25 NOTE — Progress Notes (Signed)
This encounter was created in error - please disregard.

## 2016-10-25 NOTE — ED Notes (Signed)
Attempted to call reports to [facility] to notify pt will be returning. Unable to reach anyone at [facility].

## 2016-10-25 NOTE — ED Notes (Signed)
[facility] called for transport.  

## 2016-10-25 NOTE — Discharge Instructions (Signed)
Your workup today did not show convincing evidence of infection in your abdomen. Your urinalysis did not show infection. Since your laboratory testing was reassuring, and your symptoms improved after paracentesis, we feel you are safe for discharge. If any symptoms return, change, or worsen, please return to the nearest emergency department.

## 2016-10-25 NOTE — ED Notes (Signed)
Pt abd no longer leaking fluid, dermabond remains dry. Dr. Rush Landmarkegeler aware.

## 2016-10-26 ENCOUNTER — Non-Acute Institutional Stay (SKILLED_NURSING_FACILITY): Payer: PRIVATE HEALTH INSURANCE | Admitting: Nurse Practitioner

## 2016-10-26 ENCOUNTER — Ambulatory Visit (HOSPITAL_COMMUNITY): Payer: PRIVATE HEALTH INSURANCE

## 2016-10-26 ENCOUNTER — Encounter: Payer: Self-pay | Admitting: Nurse Practitioner

## 2016-10-26 DIAGNOSIS — K7031 Alcoholic cirrhosis of liver with ascites: Secondary | ICD-10-CM | POA: Diagnosis not present

## 2016-10-26 DIAGNOSIS — R410 Disorientation, unspecified: Secondary | ICD-10-CM | POA: Diagnosis not present

## 2016-10-26 DIAGNOSIS — A0472 Enterocolitis due to Clostridium difficile, not specified as recurrent: Secondary | ICD-10-CM | POA: Diagnosis not present

## 2016-10-26 DIAGNOSIS — R1084 Generalized abdominal pain: Secondary | ICD-10-CM | POA: Diagnosis not present

## 2016-10-26 NOTE — Progress Notes (Signed)
Nursing Home Location:  [name] Room: 119  Place of Service: SNF (31)  PCP: Marcelo Baldy, MD  Allergies  Allergen Reactions  . Sulfamethoxazole Other (See Comments) and Rash  . Sulfamethoxazole-Trimethoprim Rash    total body rash, 104 temp, hospitalizati  . Amoxicillin-Pot Clavulanate Diarrhea    Tolerated Ceftriaxone.  . Pollen Extract Other (See Comments)    Stuffy nose  . Sulfa Antibiotics     Chief Complaint  Patient presents with  . Acute Visit    Resident is being seen due to increased confusion.     HPI:  Patient is a 51 y.o. male seen today at [facility] at the request of nursing for increase confusion. Ray Hayden is at [facility] after hospitalization from 4/18-5/10/18 with an upper GI bleed due to varices in the context of alcohol abuse and cirrhosis. During hospitalization he was intubated for alcohol withdrawal; acute respiratory failure possibly from aspiration pneumonia and to allow performance of EGD. EGD 4/18 revealed grade 3 bleeding esophageal varices, banding 4 was completed.  Pt has had multiple paracentesis. Pt was sent to the ED on 5/21 (2 days ago) due to abdominal pain and tightness. He had  Paracentesis while in ED and 4L was removed which was the most that could be done outpatient. He is also on aldactone 200 mg and lasix 80 mg daily.  he  After most paracentesis abdominal pain and shortness of breath initially improved but now ascites is back and pt is requesting oxycodone more frequently - he has already been placed on bentyl for abdominal pain. Brother is at [employer] today reporting he is more confused than normal. He is able to have a conversation normally at times but conversation cycles (repeating himself often) and he reports he has had visitors that have not been there.  On 10/21/16 he was diagnosised with c diff and was having copious amounts of diarrhea, therefore lactulose was held. Pt has been started on  Vancomycin PO and has had had any BMs in 2 days per pt. No with more confusion. Reports shortness of breath but no cough or congestion. No chest pains. No fevers or chills. Sitting up in room and reports he has been dizzy but not feeling unsteady or that he is going to fall. Worked with PT today without problems. They also noticed his confusion  Review of Systems:  Review of Systems  Constitutional: Negative for activity change, appetite change, fatigue and unexpected weight change.  HENT: Negative for congestion.   Eyes: Negative.   Respiratory: Positive for shortness of breath. Negative for cough.   Cardiovascular: Positive for leg swelling. Negative for chest pain and palpitations.  Gastrointestinal: Positive for abdominal distention and diarrhea (but no stool in 2 days). Negative for abdominal pain, constipation, nausea and rectal pain.  Genitourinary: Negative for difficulty urinating and dysuria.  Musculoskeletal: Negative for arthralgias and myalgias.  Skin: Positive for color change and pallor. Negative for wound.  Neurological: Negative for dizziness and weakness.  Psychiatric/Behavioral: Positive for confusion. Negative for agitation and behavioral problems.    Past Medical History:  Diagnosis Date  . Adynamic ileus (HCC)    small bowel >> colon.  no obstructive sxs (no n/v or cessation of bowel movements)  . Anemia 04/2016   due to GI bleed and suspect chronic disease. required PRBC transfusions in 04/2016 and 09/2015.  Marland Kitchen Anxiety 2016  . Cirrhosis (HCC) 2017   due to ETOH and ? contribution of NAFLD.    Marland Kitchen  Coagulopathy (HCC) 04/2016   due to liver disease. did not respond to Vitamin K.  required FFP x 3 units in 09/2016.   . Encephalopathy 04/2016   hepatic and toxic metabolic encephalopathy.  lactulose initiated 04/2016, Rifaximin added 09/2016.   Marland Kitchen Hypertension   . Sinus tachycardia 09/2016   Past Surgical History:  Procedure Laterality Date  . COLONOSCOPY W/ POLYPECTOMY   07/20/2016   tubular adenomatous polyp on path report from [facility].   . ESOPHAGOGASTRODUODENOSCOPY N/A 09/21/2016   Charlie Pitter III, MD; [facility] ENDOSCOPY: grade 3, bleeding esophageal varices, s/p banding x 4.   . ESOPHAGOGASTRODUODENOSCOPY  04/16/2016, 07/19/16   04/2016 for hematemesis at [facility] hospital: medium esoph varices, banded x 6, erosive esophagitis. repeated 07/2015, report not found.  . ESOPHAGOGASTRODUODENOSCOPY N/A 10/08/2016   Procedure: ESOPHAGOGASTRODUODENOSCOPY (EGD);  Surgeon: Beverley Fiedler, MD;  Location: [facility] ENDOSCOPY;  Service: Gastroenterology;  Laterality: N/A;  . FLEXIBLE SIGMOIDOSCOPY N/A 10/08/2016   Procedure: FLEXIBLE SIGMOIDOSCOPY;  Surgeon: Beverley Fiedler, MD;  Location: [facility] ENDOSCOPY;  Service: Gastroenterology;  Laterality: N/A;  . IR PARACENTESIS  09/29/2016   05/2016 at [facility]: Moderate volume of serous ascites. 09/29/16 at [facility]: 2.1 liters (challenging to obtain this quantity of fluid, no SBP.  10/05/16: not enough fluid to tap  . IR PARACENTESIS  10/11/2016  . NASAL SEPTUM SURGERY  05/2010   Social History:   reports that he has quit smoking. His smoking use included Cigarettes. He has never used smokeless tobacco. He reports that he drinks alcohol. He reports that he does not use drugs.  History reviewed. No pertinent family history.  Medications: [name] Medications  New Prescriptions   No medications on file  Previous Medications   AMBULATORY NON FORMULARY MEDICATION    Give 120 ml Med Pass by mouth three times a day between meals.   DICYCLOMINE (BENTYL) 20 MG TABLET    Take 20 mg by mouth 3 (three) times daily.   FENTANYL (DURAGESIC - DOSED MCG/HR) 12 MCG/HR    Apply one patch topically every 72 hours, remove old patch   FUROSEMIDE (LASIX) 40 MG TABLET    Take 2 tablets (80 mg total) by mouth daily.   GABAPENTIN (NEURONTIN) 300 MG CAPSULE    Take 300 mg by mouth 3 (three) times daily.   HYDROCORTISONE (ANUSOL-HC) 2.5 % RECTAL CREAM    Place 1  application rectally 4 (four) times daily as needed for hemorrhoids or itching.   LACTULOSE (CHRONULAC) 10 GM/15ML SOLUTION    Take 30 mLs (20 g total) by mouth 3 (three) times daily.   LITHIUM CARBONATE 150 MG CAPSULE    Take 150 mg by mouth daily.   MAGNESIUM OXIDE (MAG-OX) 400 MG TABLET    Take 400 mg by mouth 2 (two) times daily.   MELATONIN 3 MG TABS    Take 1 tablet by mouth at bedtime.   MULTIPLE VITAMIN (MULTIVITAMIN WITH MINERALS) TABS TABLET    Take 1 tablet by mouth daily.   NICOTINE (NICODERM CQ - DOSED IN MG/24 HOURS) 14 MG/24HR PATCH    Place 14 mg onto the skin daily.   NUTRITIONAL SUPPLEMENTS (ENSURE CLEAR PO)    Take 1 Can by mouth 2 (two) times daily.   OXYCODONE (OXY IR/ROXICODONE) 5 MG IMMEDIATE RELEASE TABLET    Take 1 tablet (5 mg total) by mouth every 6 (six) hours as needed for severe pain.   PANTOPRAZOLE (PROTONIX) 40 MG TABLET    Take 40 mg by  mouth 2 (two) times daily.    RIFAXIMIN (XIFAXAN) 550 MG TABS TABLET    Take 1 tablet (550 mg total) by mouth 2 (two) times daily.   SPIRONOLACTONE (ALDACTONE) 100 MG TABLET    Take 2 tablets (200 mg total) by mouth daily.   THIAMINE (VITAMIN B-1) 100 MG TABLET    Take 100 mg by mouth daily.   VANCOMYCIN (VANCOCIN) 250 MG CAPSULE    Take 250 mg by mouth every 6 (six) hours as needed (c-Diff).   VENLAFAXINE XR (EFFEXOR-XR) 150 MG 24 HR CAPSULE    Take 150 mg by mouth daily with breakfast.  Modified Medications   No medications on file  Discontinued Medications   BUSPIRONE (BUSPAR) 15 MG TABLET    Take 15 mg by mouth daily.    DICYCLOMINE (BENTYL) 10 MG CAPSULE    Take 20 mg by mouth 3 (three) times daily. Take one tablet by mouth three times daily as needed for pain    FEEDING SUPPLEMENT (BOOST / RESOURCE BREEZE) LIQD    Take 1 Container by mouth 3 (three) times daily between meals.   HYDROCORTISONE (ANUSOL-HC) 25 MG SUPPOSITORY    Place 1 suppository (25 mg total) rectally 2 (two) times daily.     Physical Exam: Vitals:    10/26/16 1402  BP: 117/74  Pulse: 82  Resp: 18  Temp: 97.6 F (36.4 C)  SpO2: 98%  Weight: 224 lb (101.6 kg)  Height: 6\' 6"  (1.981 m)    Physical Exam  Constitutional: He appears ill. No distress.  Appears much older than stated age  HENT:  Head: Normocephalic and atraumatic.  Mouth/Throat: Oropharynx is clear and moist. No oropharyngeal exudate.  Eyes: Conjunctivae and EOM are normal. Pupils are equal, round, and reactive to light.  Neck: Normal range of motion. Neck supple.  Cardiovascular: Normal rate, regular rhythm and normal heart sounds.   Pulmonary/Chest: Effort normal and breath sounds normal.  Abdominal: Soft. Bowel sounds are normal. He exhibits distension (firm). There is no tenderness.  Musculoskeletal: He exhibits edema. He exhibits no tenderness.  Neurological: He is alert.  Skin: Skin is warm and dry. He is not diaphoretic.  Psychiatric: He has a normal mood and affect.    Labs reviewed: Basic Metabolic Panel:  Recent Labs  95/62/1304/23/18 0412  09/27/16 0430  09/29/16 [id number]  10/07/16 0428 10/08/16 0856  10/11/16 0324 10/12/16 0155 10/20/16 10/22/16 10/24/16 1345  NA 142  < > 144  < > 139  < > 129* 127*  < > 129* 130* 131* 131* 125*  K 3.2*  < > 3.3*  < > 3.6  < > 4.1 4.2  < > 3.6 3.7 3.5 3.7 3.2*  CL 111  < > 113*  < > 107  < > 98* 99*  < > 103 101  --   --  95*  CO2 22  < > 24  < > 23  < > 20* 18*  < > 19* 21*  --   --  23  GLUCOSE 114*  < > 115*  < > 89  < > 102* 113*  < > 141* 122*  --   --  106*  BUN 28*  < > 19  < > 13  < > 15 29*  < > 10 7 9 10 9   CREATININE 0.71  < > 0.61  < > 0.75  < > 0.72 0.96  < > 0.63 0.58* 0.4* 0.4* 0.78  CALCIUM 7.5*  < >  7.7*  < > 8.0*  < > 7.4* 7.3*  < > 7.3* 7.8*  --   --  7.5*  MG 1.8  --  2.2  --  1.9  --  1.6* 1.8  --   --   --   --   --   --   PHOS 3.6  --  3.2  --  3.4  --   --   --   --   --   --   --   --   --   < > = values in this interval not displayed. Liver Function Tests:  Recent Labs  10/07/16 0428  10/09/16 0437 10/24/16 1345  AST 77* 63* 59*  ALT 47 34 32  ALKPHOS 76 58 82  BILITOT 7.5* 9.2* 5.5*  PROT 6.6 5.3* 6.1*  ALBUMIN 2.0* 1.7* 1.8*    Recent Labs  09/21/16 0800 09/27/16 0430 10/24/16 1345  LIPASE 65* 57* 66*    Recent Labs  09/30/16 0711 10/04/16 0403 10/07/16 0428  AMMONIA 54* 56* 42*   CBC:  Recent Labs  09/21/16 0800  10/12/16 0155 10/13/16 0738 10/20/16 10/22/16 10/24/16 1345  WBC 12.0*  < > 15.2* 24.0* 11.7 13.4 15.3*  NEUTROABS 5.9  --   --   --   --   --  13.7*  HGB 7.2*  6.0*  < > 8.3* 8.4* 9.2* 9.4* 8.0*  HCT 20.7*  17.3*  < > 24.3* 24.2* 27* 28* 23.0*  MCV 98.9  < > 94.6 93.8  --   --  94.7  PLT 115*  < > 115* 137* 154 159 124*  < > = values in this interval not displayed. TSH: No results for input(s): TSH in the last 8760 hours. A1C: No results found for: HGBA1C Lipid Panel:  Recent Labs  09/21/16 0815  TRIG 64    Radiological Exams: Dg Chest Port 1 View  Result Date: 10/24/2016 CLINICAL DATA:  51 y/o  M; shortness of breath. EXAM: PORTABLE CHEST 1 VIEW COMPARISON:  10/05/2016 chest radiograph. FINDINGS: Stable normal cardiac silhouette given projection and technique. Low lung volumes accentuate pulmonary markings. Streaky opacities at lung bases probably represent atelectasis. No pleural effusion or pneumothorax. Bones are unremarkable. IMPRESSION: Low lung volumes.  Minor bibasilar atelectasis. Electronically Signed   By: Mitzi Hansen M.D.   On: 10/24/2016 13:59    Assessment/Plan 1. Alcoholic cirrhosis of liver with ascites (HCC) paracentesis 2 days ago with total of 4 L removed, now with worsening ascites. Will have IR consulted again for repeat paracentesis and GI referral ASAP -will restart lactulose at this time.   2. Generalized abdominal pain Reports oxycodone does not help very much but taking frequently, will reduce to every 8 hours as this could be adding to confusion. To cont bentyl 20 mg TID  3.  Confusion Most likely due to elevated ammonia level. Has not had stool in 2 days despite being c diff positive.  -will restart lactulose  -will follow up ammonia level, cbc with diff, bmp Pt with elevated WBC in hospital most likely from c diff, other workup negative for acute findings.   4. C. difficile colitis To cont vanc 125 mg PO q 6 hours for total of 10 days  Total time 40 mins:  time greater than 50% of total time spent doing pt and family counseling and coordination of care.   Janene Harvey. Biagio Borg  [facility] [phone] 8 am -  5 pm) [phone] (after hours)

## 2016-10-27 ENCOUNTER — Non-Acute Institutional Stay (SKILLED_NURSING_FACILITY): Payer: PRIVATE HEALTH INSURANCE | Admitting: Internal Medicine

## 2016-10-27 ENCOUNTER — Encounter: Payer: Self-pay | Admitting: Internal Medicine

## 2016-10-27 ENCOUNTER — Encounter: Payer: Self-pay | Admitting: *Deleted

## 2016-10-27 DIAGNOSIS — R195 Other fecal abnormalities: Secondary | ICD-10-CM | POA: Diagnosis not present

## 2016-10-27 DIAGNOSIS — K7290 Hepatic failure, unspecified without coma: Secondary | ICD-10-CM | POA: Diagnosis not present

## 2016-10-27 DIAGNOSIS — K7682 Hepatic encephalopathy: Secondary | ICD-10-CM

## 2016-10-27 LAB — BODY FLUID CULTURE
CULTURE: NO GROWTH
Special Requests: NORMAL

## 2016-10-27 LAB — CBC AND DIFFERENTIAL
HCT: 29 % — AB (ref 41–53)
Hemoglobin: 9.6 g/dL — AB (ref 13.5–17.5)
Platelets: 117 10*3/uL — AB (ref 150–399)
WBC: 12.5 10*3/mL

## 2016-10-27 LAB — BASIC METABOLIC PANEL
BUN: 13 mg/dL (ref 4–21)
Creatinine: 0.9 mg/dL (ref 0.6–1.3)
GLUCOSE: 83 mg/dL
POTASSIUM: 3.8 mmol/L (ref 3.4–5.3)
SODIUM: 127 mmol/L — AB (ref 137–147)

## 2016-10-27 NOTE — Assessment & Plan Note (Addendum)
Chronulac has been resumed, severity of diarrhea in the context of positive C. difficile will be monitored Opioid frequency decreased, it will be held if he is sedated

## 2016-10-27 NOTE — Assessment & Plan Note (Signed)
Continue vancomycin.

## 2016-10-27 NOTE — Progress Notes (Signed)
   [name] Room: 119  PCP: Marcelo BaldyAlexander, Megan, MD [phone] [address] [place] [id number]   This is a nursing facility follow up for specific acute issue of mental status changes.  Interim medical record and care since [facility] was updated with review of diagnostic studies and change in clinical status since last visit were documented.  HPI: The patient has not shown significant improvement after his third paracentesis. He's had some associated mental status changes with some obtundation.This is in the context of lactulose being held due to severe diarrhea with + C dif toxin on stool. Lactulose has ben restarted but mental status changes persist.Because of this his opioid dose was decreased .Ammonia level is 229. While hospitalized the high ammonia level was 97.  Other labs revealed stable chemistries. Sodium has risen from 125 up to 127. Additionally his white count has dropped from 15,300 down to 12,500. Anemia slightly improved with hemoglobin 9.6, up from a value of 8. There has been some progression of his thrombocytopenia with a value 117,000, down from 124,000.   Review of systems could not be completed as he is profoundly somnolent.  Physical exam:  Pertinent or positive findings: As noted he is sleeping soundly with some snoring but no apnea. Gallop cadence is present with a flow murmur at the left sternal border. Breath sounds are decreased. Abdomen is distended but is soft. He has 2+ pitting edema. Pedal pulses are decreased  General appearance:Adequately nourished; no acute distress , increased work of breathing is present.   Lymphatic: No lymphadenopathy about the head, neck, axilla . Eyes: No conjunctival inflammation or lid edema is present.  Ears:  External ear exam shows no significant lesions or deformities.   Nose:  External nasal examination shows no deformity or inflammation. Nasal mucosa are pink and moist without  lesions ,exudates Oral exam: lips and gums are healthy appearing. Neck:  No thyromegaly, masses, tenderness noted.    Heart:  No click, rub .  Lungs: without wheezes, rhonchi,rales , rubs. Abdomen:Bowel sounds are decreased but present. Abdomen is soft and  Clinically nontender with no organomegaly, masses. GU: deferred  Extremities:  No cyanosis, clubbing Skin: Warm & dry w/o tenting. No significant lesions or rash.  See summary under each active problem in the Problem List with associated updated therapeutic plan

## 2016-10-27 NOTE — Patient Instructions (Signed)
See assessment and plan under each diagnosis acutely for this visit  

## 2016-10-28 ENCOUNTER — Encounter: Payer: Self-pay | Admitting: Internal Medicine

## 2016-11-01 ENCOUNTER — Ambulatory Visit (HOSPITAL_COMMUNITY): Admission: RE | Admit: 2016-11-01 | Payer: PRIVATE HEALTH INSURANCE | Source: Ambulatory Visit

## 2016-11-09 ENCOUNTER — Ambulatory Visit (HOSPITAL_COMMUNITY): Payer: PRIVATE HEALTH INSURANCE

## 2018-04-18 IMAGING — CT CT ABD-PELV W/ CM
2 of 5 series · 15 of 46 positions shown, 17 images · IV contrast (iopamidol)
Comparison: None.

CLINICAL DATA: Increasing abdominal distention was noted while
placing a central venous catheter.

EXAM:
CT ABDOMEN AND PELVIS WITH CONTRAST
TECHNIQUE: Multidetector CT imaging of the abdomen and pelvis was performed
using the standard protocol following bolus administration of
intravenous contrast.
CONTRAST:  100mL [id number] IOPAMIDOL (DZKKA7-0JJ) INJECTION 61%

[Series 3: abd/ pelvis 5.0 i30f 2 · axial · 0.92mm/px · z∈[-775,-260]mm · 12 of 115 slices shown, 14 images]
[im 6/115  soft-tissue]
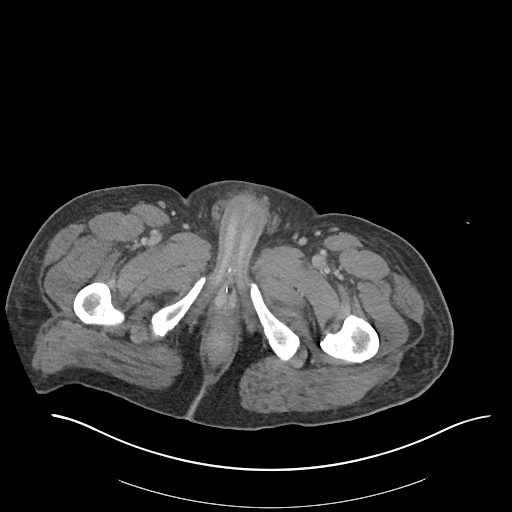
[im 6/115  bone]
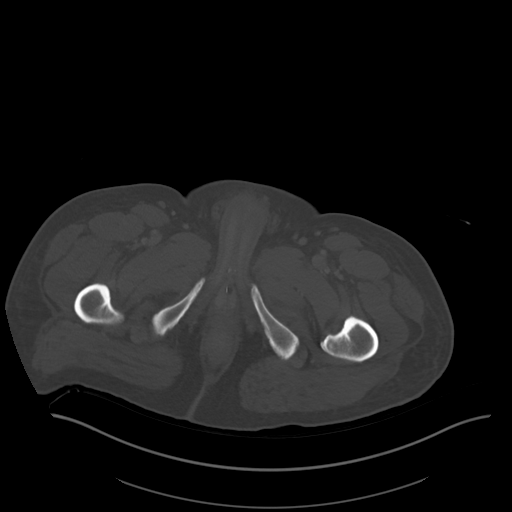
[im 18/115  soft-tissue]
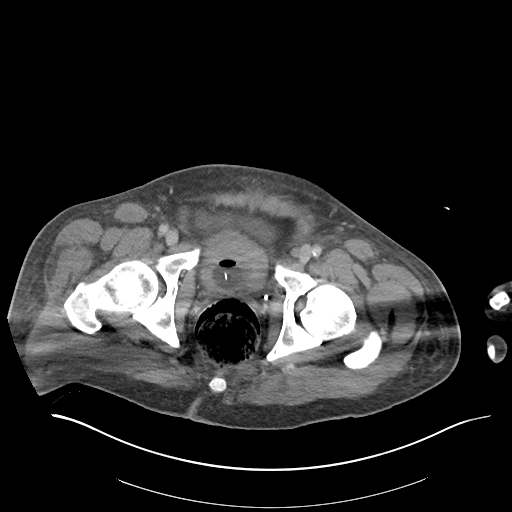
[im 23/115  soft-tissue]
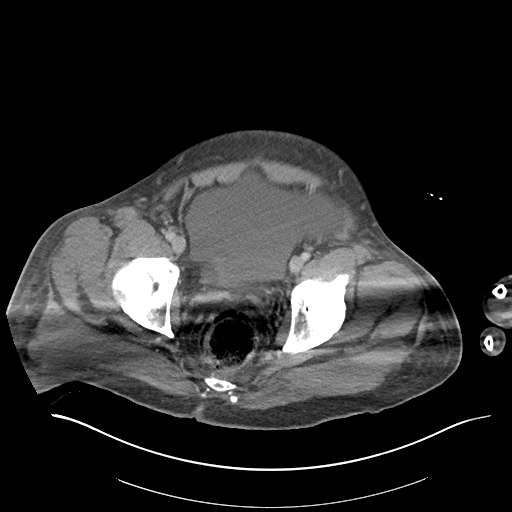
[im 35/115  soft-tissue]
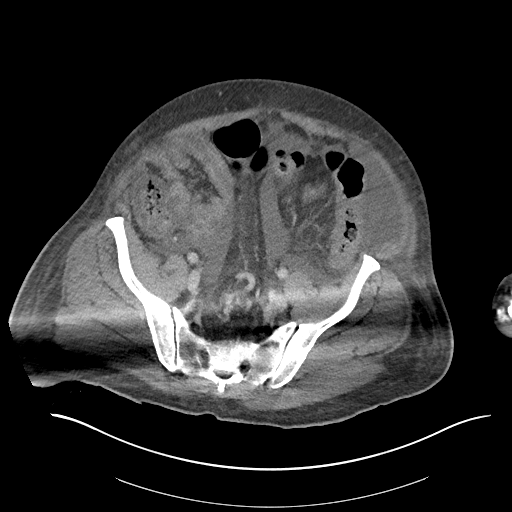
[im 46/115  soft-tissue]
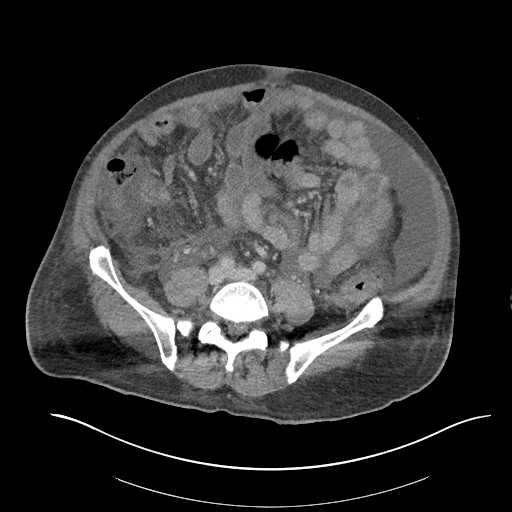
[im 52/115  soft-tissue]
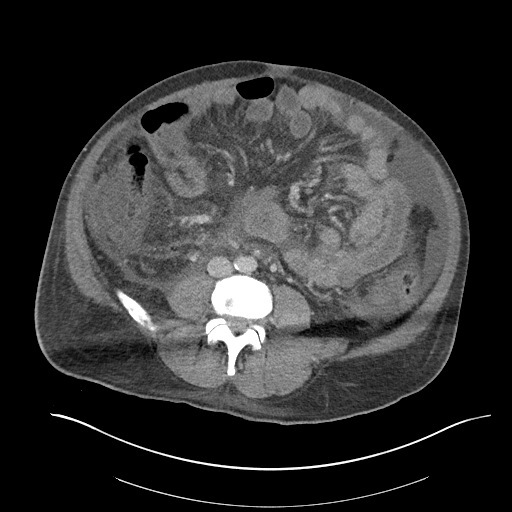
[im 63/115  soft-tissue]
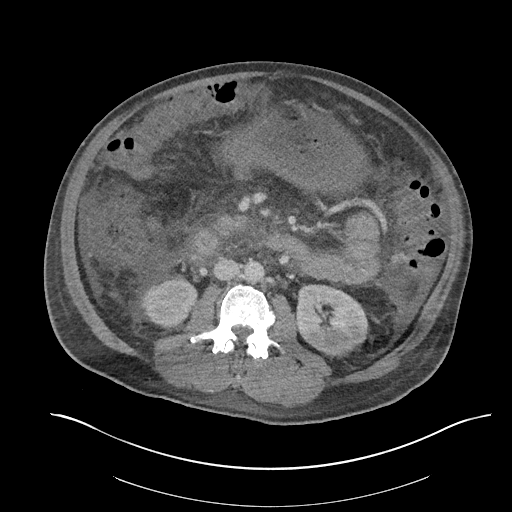
[im 69/115  soft-tissue]
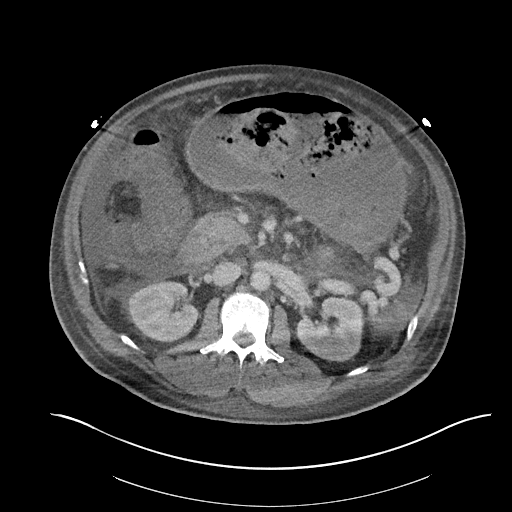
[im 80/115  soft-tissue]
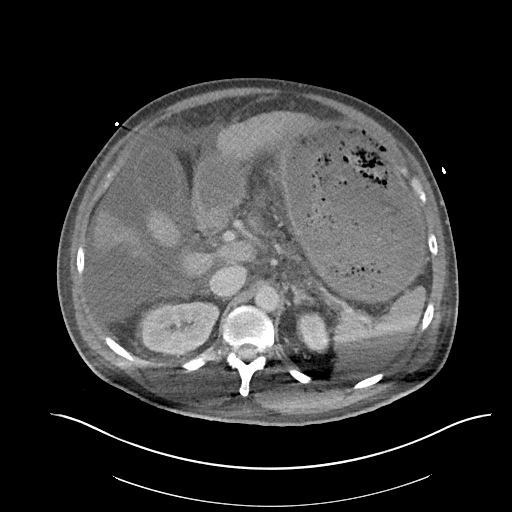
[im 80/115  bone]
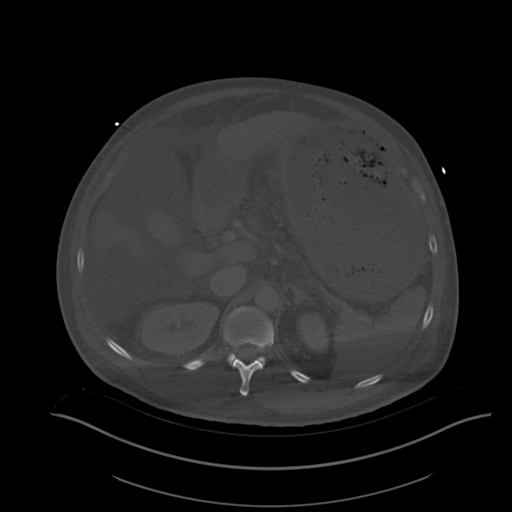
[im 92/115  soft-tissue]
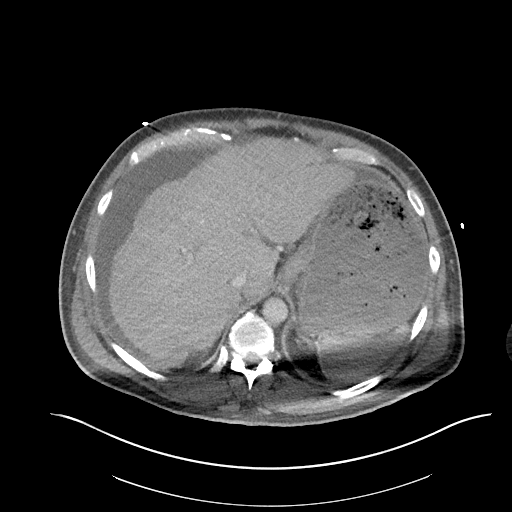
[im 97/115  soft-tissue]
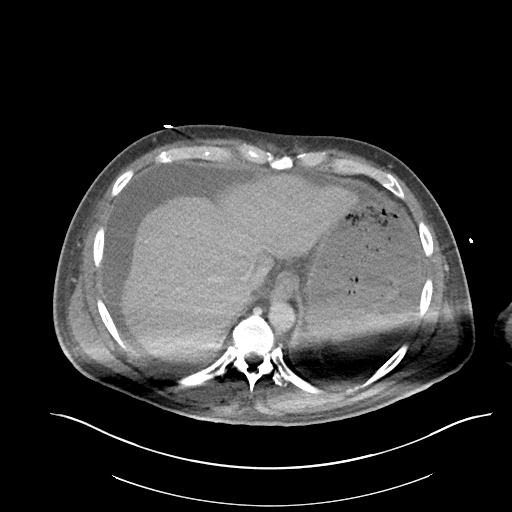
[im 109/115  soft-tissue]
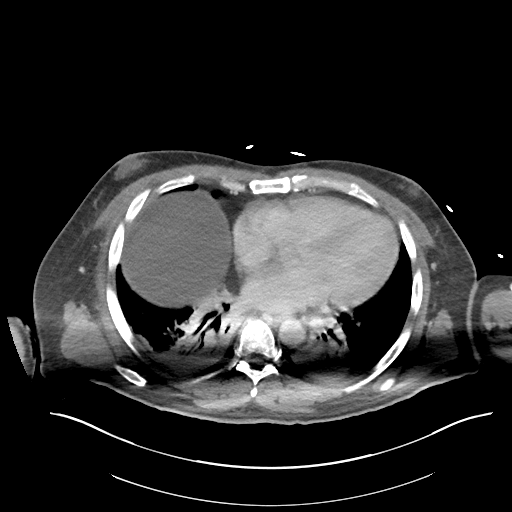

[Series 6: coronal soft tissue · coronal · 0.97mm/px · 3 of 123 slices shown]
[im 41/123  soft-tissue]
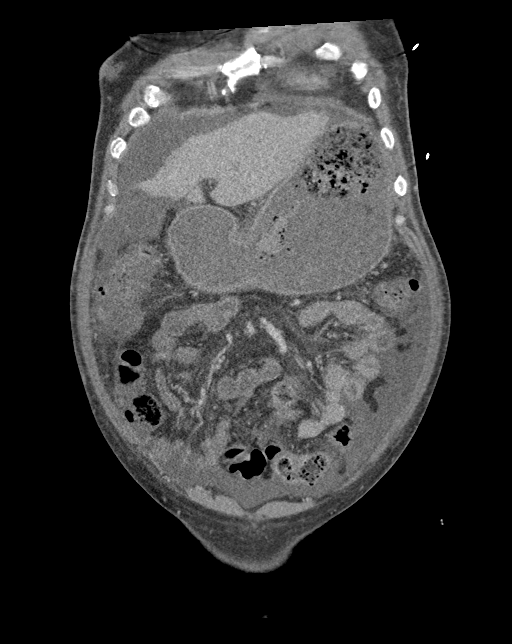
[im 55/123  soft-tissue]
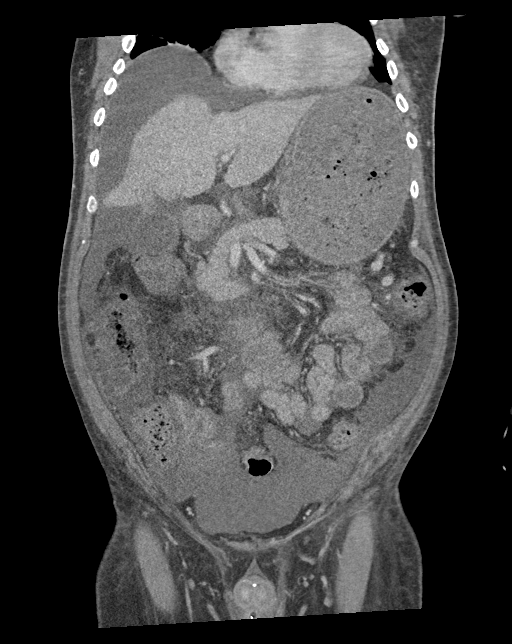
[im 68/123  soft-tissue]
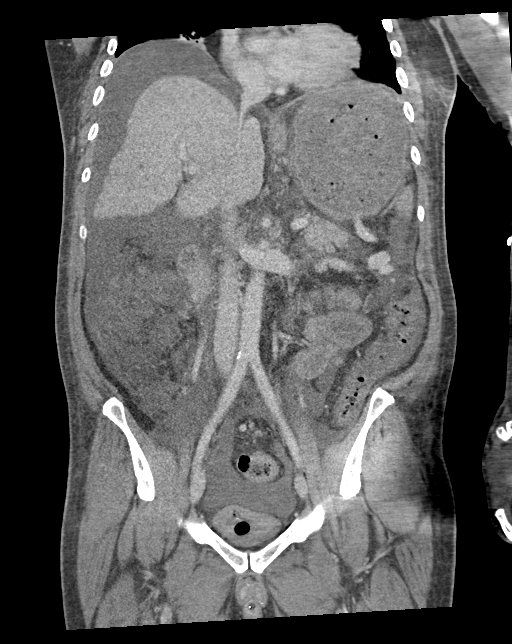

[15 of 46 positions shown; findings below may reference images not displayed]

FINDINGS: Lower chest: Small bilateral pleural effusions with basilar
consolidation. Motion artifact.

Hepatobiliary: Nodular liver surface with increased size of the
caudate lobe and lateral segment left lobe of the liver suggesting
hepatic cirrhosis. No focal liver lesions are identified.
Gallbladder is mildly distended. No bile duct dilatation.

Pancreas: Unremarkable. No pancreatic ductal dilatation or
surrounding inflammatory changes.

Spleen: Normal in size without focal abnormality.

Adrenals/Urinary Tract: No adrenal gland nodules. Renal nephrograms
are homogeneous and symmetrical. No hydronephrosis or hydroureter.
Bladder is decompressed with a Foley catheter.

Stomach/Bowel: Stomach is distended with fluid and ingested
material. Duodenum and small bowel are decompressed. Can't exclude
gastric outlet obstruction versus dysmotility. The bowel wall is
diffusely hyperemic. Difficult to exclude wall thickening due to
decompression. No pneumatosis. Colon is decompressed with scattered
stool present. Suggestion of wall thickening in the transverse,
ascending, and descending colon although this is nonspecific due to
under distention. Colitis is not excluded. Appendix is not
identified.

Vascular/Lymphatic: No significant vascular findings are present. No
enlarged abdominal or pelvic lymph nodes. Mesenteric artery and vein
and portal veins appear patent. Prominent splenic varices are
demonstrated in the left upper quadrant.

Reproductive: Prostate gland is not enlarged.

Other: Diffuse free fluid throughout the abdomen and pelvis likely
representing ascites. No free air is demonstrated. Abdominal wall
musculature appears intact.

Musculoskeletal: No destructive bone lesions.
IMPRESSION: 1. Small bilateral pleural effusions with basilar consolidation.
2. Changes of hepatic cirrhosis with varices in the left upper
quadrant. Diffuse free fluid throughout the abdomen and pelvis
probably represents ascites.
3. Distended stomach may indicate gastric outlet obstruction or
gastroparesis.
4. Small bowel are decompressed. Diffuse bowel hyperemia may
indicate inflammatory process.
5. Colon is decompressed with suggestion of wall thickening in the
ascending, transverse, and descending portions. Findings may
indicate colitis. Mesenteric vessels appear patent. No pneumatosis.

## 2018-04-21 IMAGING — CR DG CHEST 1V PORT
1 series · 1 of 1 positions shown · non-contrast
Comparison: 09/23/2016

CLINICAL DATA: Ventilator dependent.

EXAM:
PORTABLE CHEST 1 VIEW

[AP]
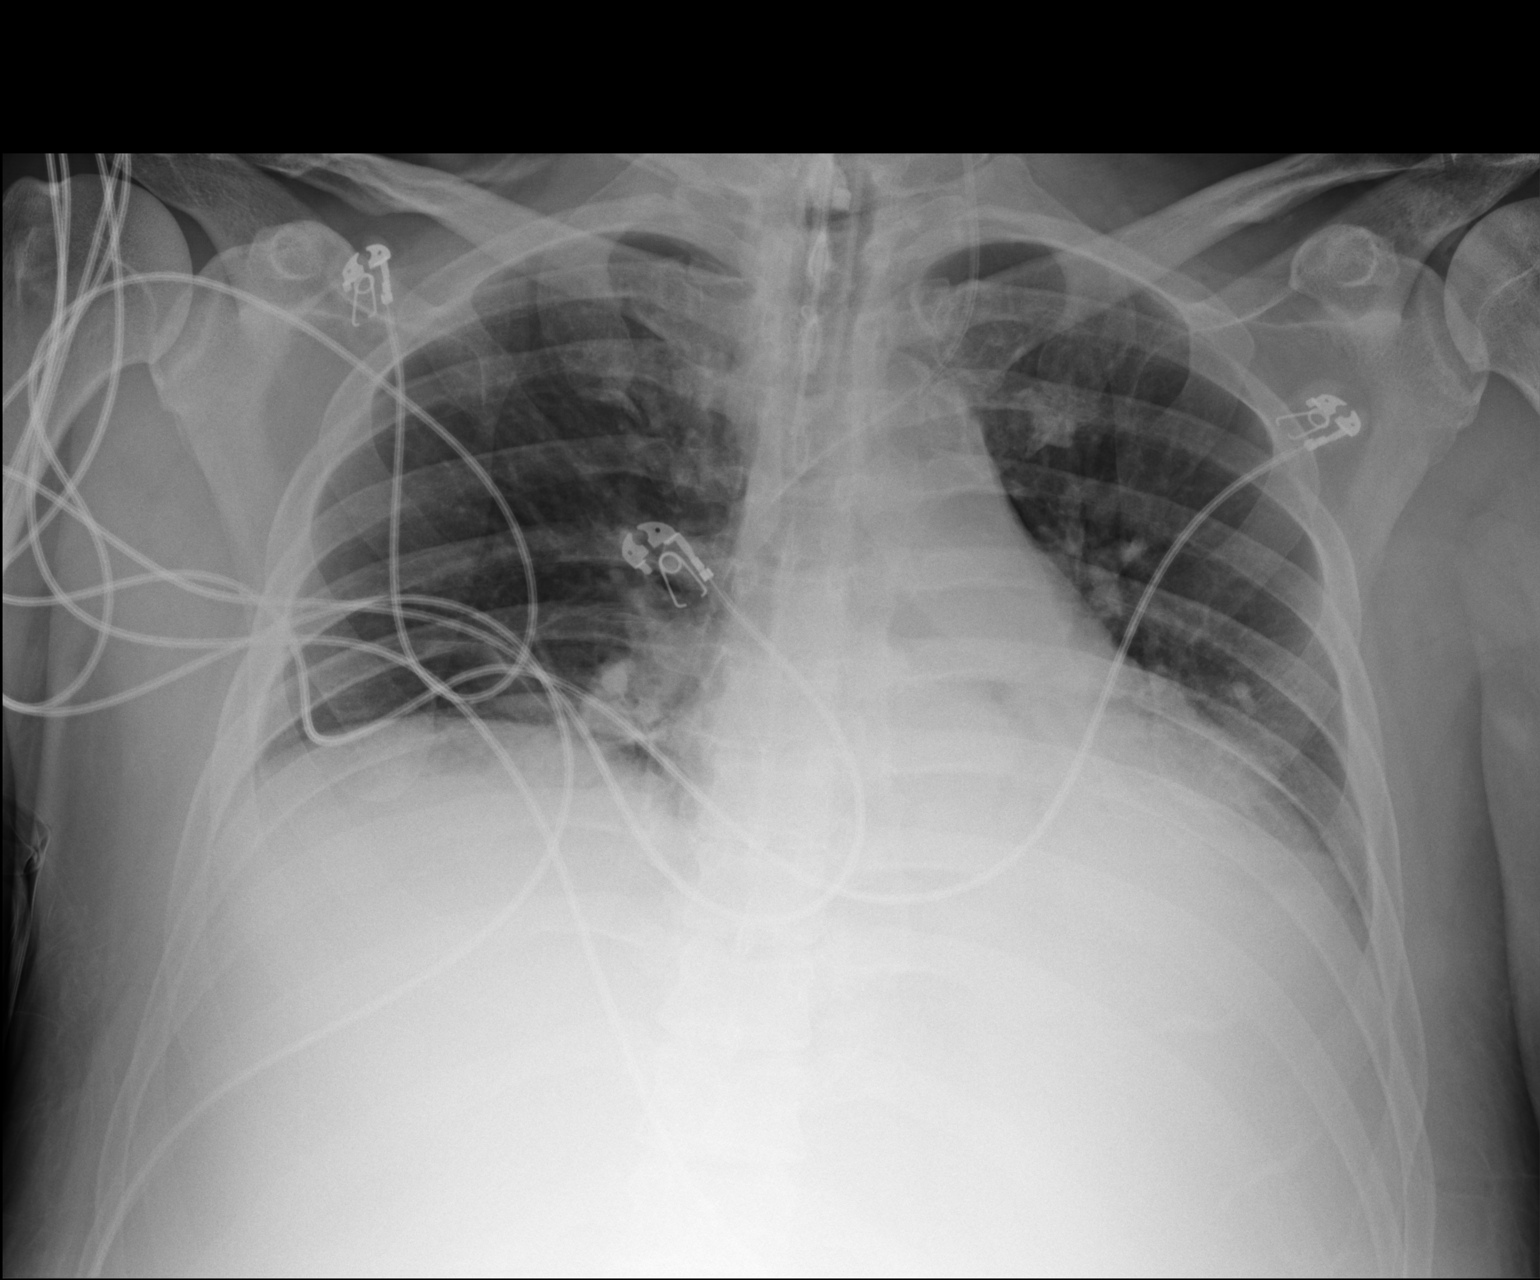

[1 of 1 positions shown; findings below may reference images not displayed]

FINDINGS: Endotracheal tube terminates 3.3 cm above the carina. Left jugular
catheter terminates over the upper SVC. Cardiomediastinal silhouette
is unchanged. Lung volumes remain diminished with mild pulmonary
vascular congestion. Patchy left greater than right base airspace
opacities have mildly improved. There may be a small left pleural
effusion. No pneumothorax is seen.
IMPRESSION: Persistent low lung volumes with slightly improved aeration of the
lung bases.

## 2018-04-22 IMAGING — CR DG CHEST 1V PORT
1 series · 1 of 1 positions shown · non-contrast
Comparison: 09/24/2016

CLINICAL DATA: Respiratory failure

EXAM:
PORTABLE CHEST 1 VIEW

[AP]
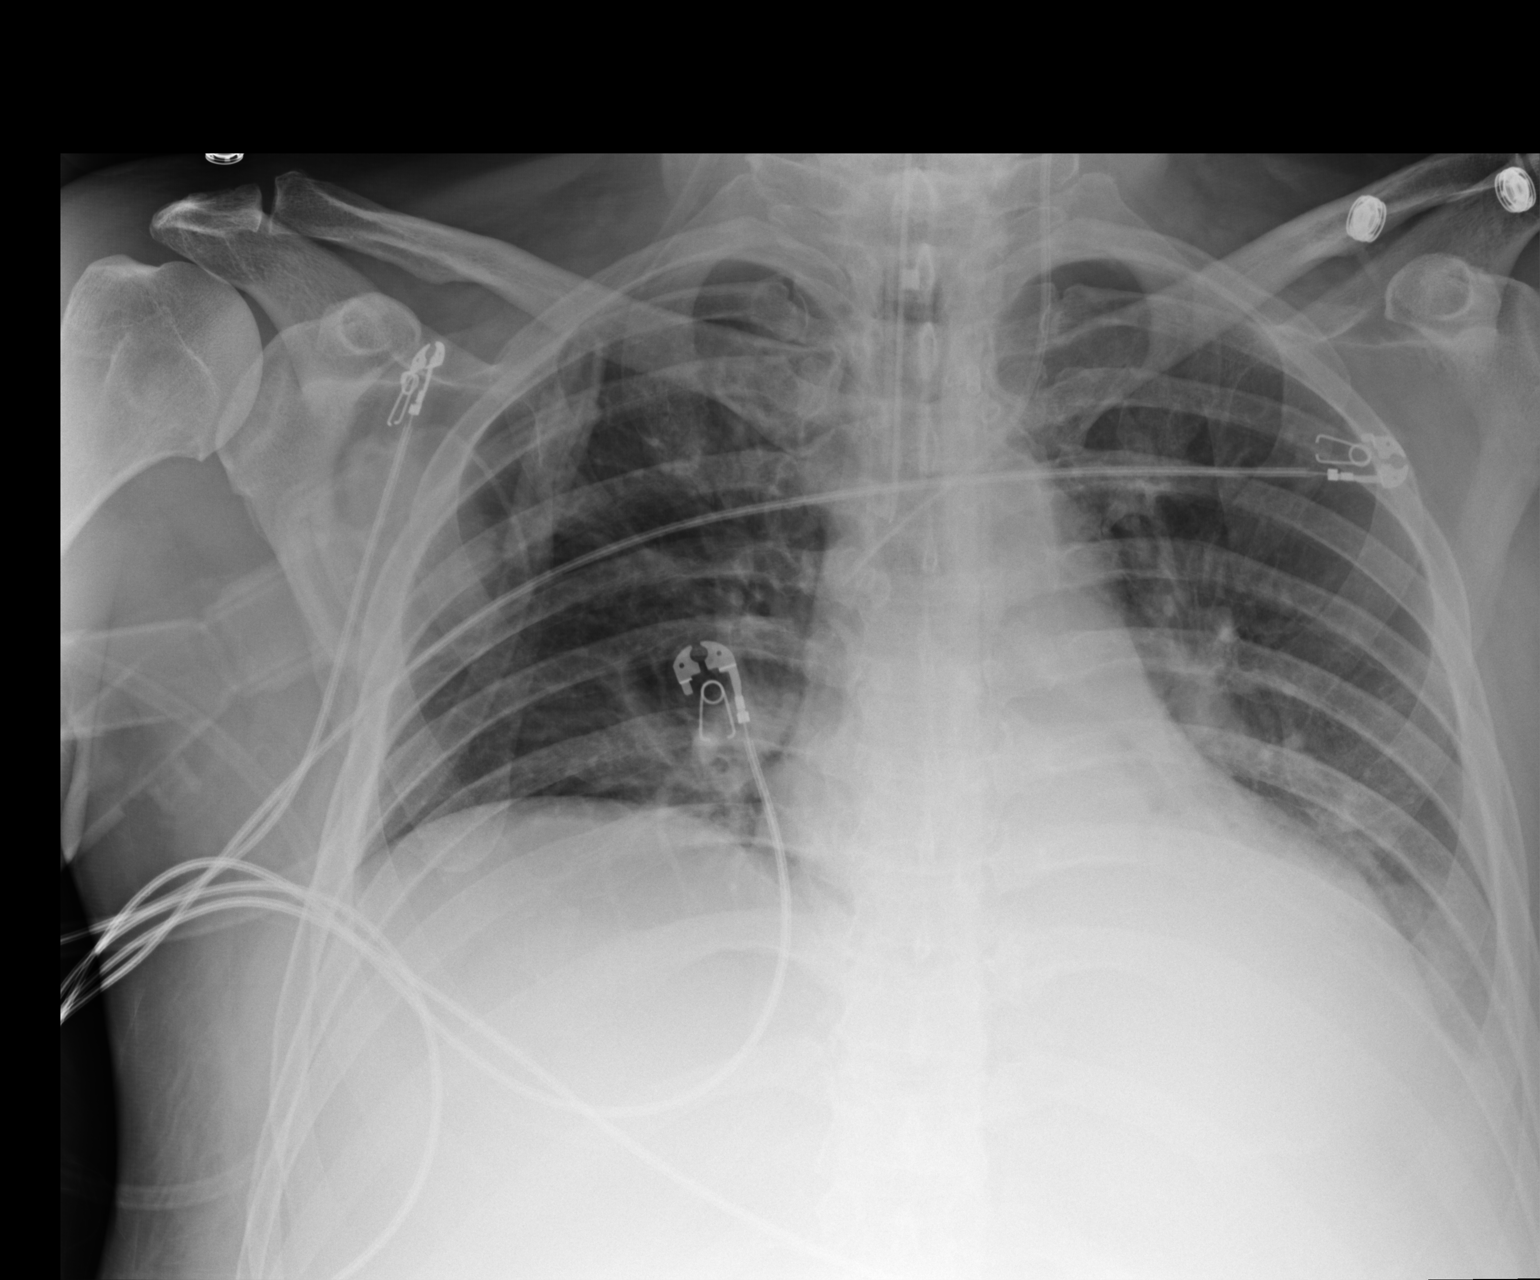

[1 of 1 positions shown; findings below may reference images not displayed]

FINDINGS: Endotracheal tube tip is approximately 2.1 cm superior to the
carina. There are low lung volumes bilaterally. No change in small
left effusion and dense left lower lobe consolidation. Stable
borderline cardiomegaly. Left-sided central venous catheter tip
overlies the upper SVC.
IMPRESSION: 1. No significant interval change in small left pleural effusion and
dense left lower lobe consolidation. Mild right basilar atelectasis.
2. Hypoventilatory changes.

## 2018-04-23 IMAGING — CR DG CHEST 1V PORT
1 series · 1 of 1 positions shown · non-contrast
Comparison: 09/25/2016 .

CLINICAL DATA: Intubation.

EXAM:
PORTABLE CHEST 1 VIEW

[AP]
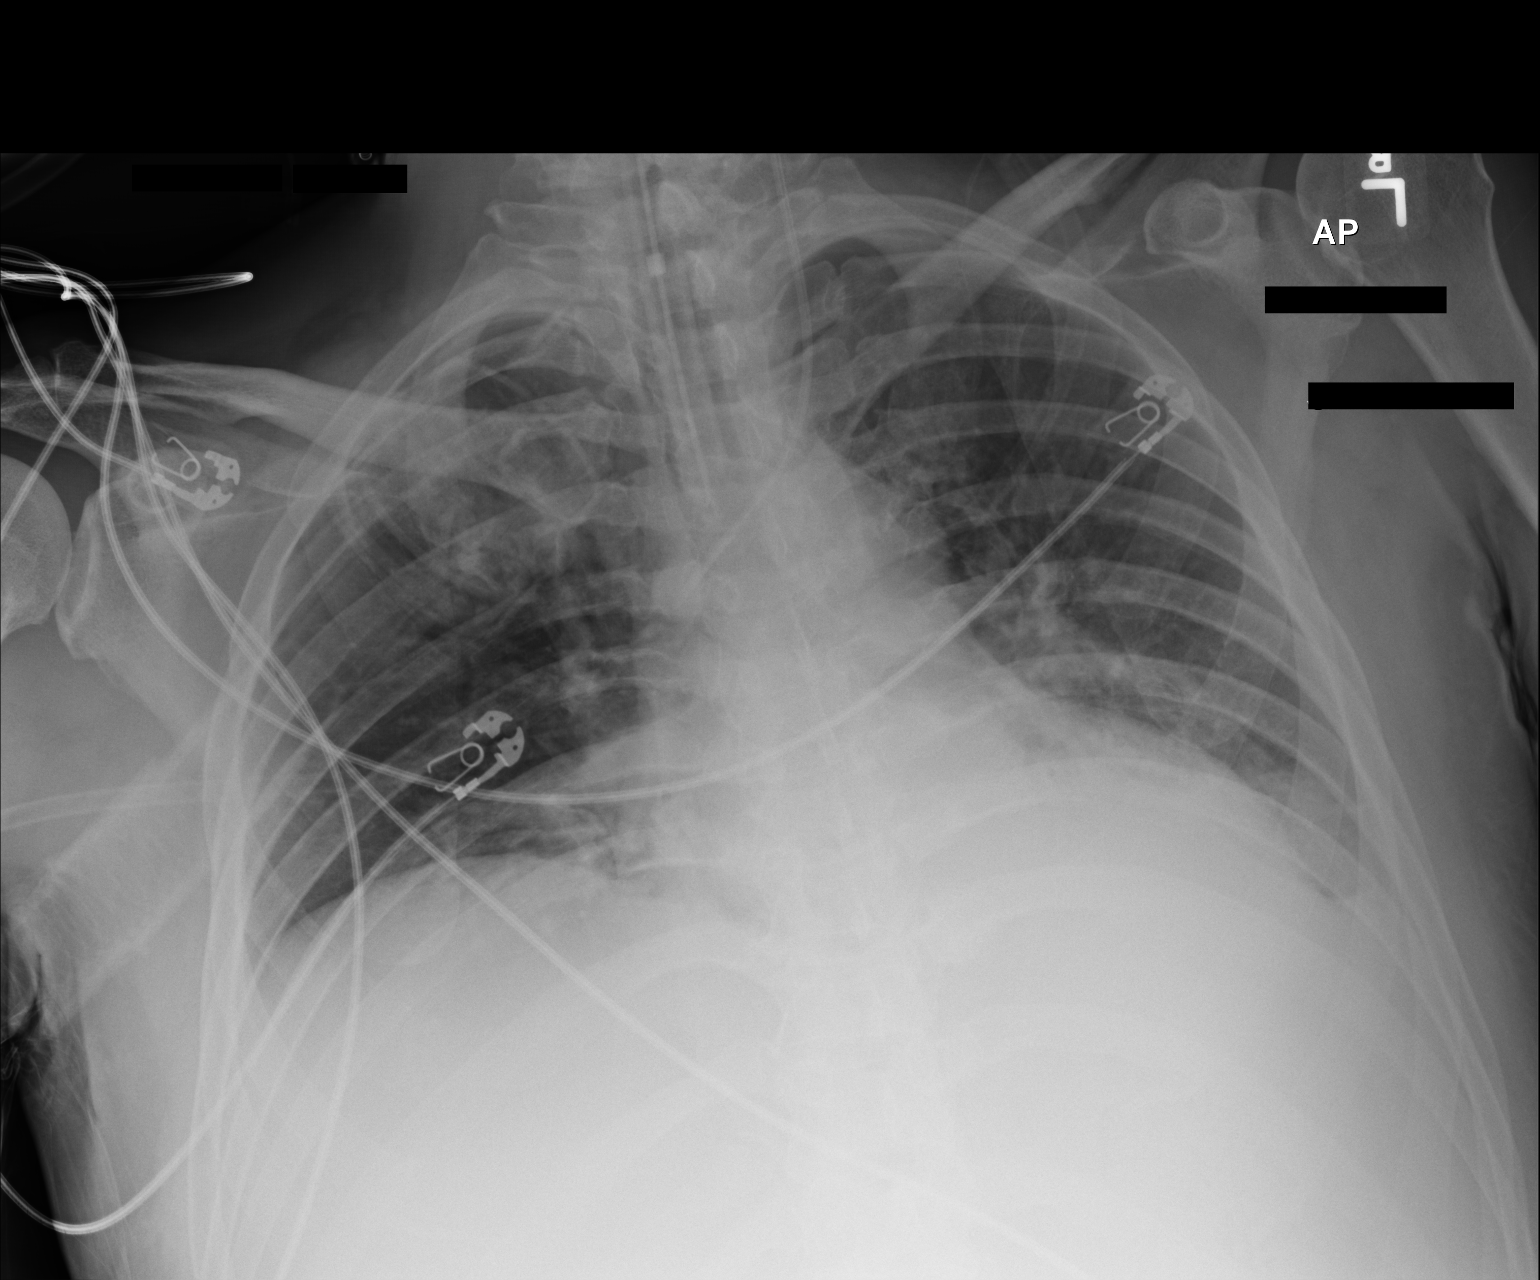

[1 of 1 positions shown; findings below may reference images not displayed]

FINDINGS: Endotracheal tube and left IJ line stable position. Heart size
stable. Persistent left base consolidation and left pleural
effusion. Low lung volumes with basilar atelectasis
IMPRESSION: 1. Lines and tubes in stable position.

2. Persistent left lower lobe consolidation and left pleural
effusion. Low lung volumes with basilar atelectasis. Chest is
unchanged from prior exam.
